# Patient Record
Sex: Female | Born: 1950
Health system: Southern US, Community
[De-identification: ages and names within clinical notes are randomized; demographics above are authoritative.]

## PROBLEM LIST (undated history)

## (undated) ENCOUNTER — Ambulatory Visit (HOSPITAL_COMMUNITY): Payer: Self-pay

## (undated) DIAGNOSIS — I10 Essential (primary) hypertension: Secondary | ICD-10-CM

## (undated) DIAGNOSIS — M1990 Unspecified osteoarthritis, unspecified site: Secondary | ICD-10-CM

## (undated) DIAGNOSIS — J45909 Unspecified asthma, uncomplicated: Secondary | ICD-10-CM

## (undated) DIAGNOSIS — R011 Cardiac murmur, unspecified: Secondary | ICD-10-CM

## (undated) HISTORY — PX: OTHER SURGICAL HISTORY: SHX169

## (undated) HISTORY — DX: Unspecified asthma, uncomplicated: J45.909

## (undated) HISTORY — PX: BLADDER SURGERY: SHX569

## (undated) HISTORY — PX: APPENDECTOMY: SHX54

## (undated) HISTORY — DX: Cardiac murmur, unspecified: R01.1

## (undated) HISTORY — PX: CHOLECYSTECTOMY: SHX55

---

## 2019-06-13 ENCOUNTER — Ambulatory Visit (HOSPITAL_COMMUNITY)
Admission: EM | Admit: 2019-06-13 | Discharge: 2019-06-13 | Disposition: A | Discharge status: Home / Self Care | Payer: Self-pay | Attending: Urgent Care | Admitting: Urgent Care

## 2019-06-13 ENCOUNTER — Other Ambulatory Visit: Payer: Self-pay

## 2019-06-13 ENCOUNTER — Encounter (HOSPITAL_COMMUNITY): Payer: Self-pay

## 2019-06-13 DIAGNOSIS — I10 Essential (primary) hypertension: Secondary | ICD-10-CM

## 2019-06-13 DIAGNOSIS — R011 Cardiac murmur, unspecified: Secondary | ICD-10-CM

## 2019-06-13 DIAGNOSIS — Z760 Encounter for issue of repeat prescription: Secondary | ICD-10-CM

## 2019-06-13 DIAGNOSIS — R030 Elevated blood-pressure reading, without diagnosis of hypertension: Secondary | ICD-10-CM

## 2019-06-13 HISTORY — DX: Unspecified osteoarthritis, unspecified site: M19.90

## 2019-06-13 HISTORY — DX: Essential (primary) hypertension: I10

## 2019-06-13 MED ORDER — LOSARTAN POTASSIUM 50 MG PO TABS: 50.0000 mg | ORAL_TABLET | Freq: Two times a day (BID) | ORAL | 0 refills | Status: DC

## 2019-06-13 NOTE — ED Triage Notes (Signed)
Pt states she needs B/P med refilled . Pt needs Losartan 50 mg.

## 2019-06-13 NOTE — ED Provider Notes (Signed)
[facility]   MRN: [id number] DOB: 1951/02/28  Subjective:   Claudia Hernandez is a 69 y.o. female presenting for medication refill of her losartan.  Patient recently moved here and will plan on leaving her long-term, has not established with a new PCP.  She is taking 50 mg twice daily.  Denies any other chronic conditions apart from hypertension and arthritis.  Denies history of heart disease, stroke.    No Known Allergies  Past Medical History:  Diagnosis Date  . Arthritis   . Hypertension      Past Surgical History:  Procedure Laterality Date  . APPENDECTOMY       Social History   Tobacco Use  . Smoking status: Never Smoker  Substance Use Topics  . Alcohol use: Never  . Drug use: Not on file    Review of Systems  Constitutional: Negative for fever and malaise/fatigue.  HENT: Negative for congestion, ear pain, sinus pain and sore throat.   Eyes: Negative for blurred vision, double vision, discharge and redness.  Respiratory: Negative for cough, hemoptysis, shortness of breath and wheezing.   Cardiovascular: Negative for chest pain.  Gastrointestinal: Negative for abdominal pain, diarrhea, nausea and vomiting.  Genitourinary: Negative for dysuria, flank pain and hematuria.  Musculoskeletal: Negative for myalgias.  Skin: Negative for rash.  Neurological: Negative for dizziness, tingling, sensory change, speech change, focal weakness, weakness and headaches.  Psychiatric/Behavioral: Negative for depression and substance abuse.     Objective:   Vitals: BP (!) 148/82 (BP Location: Right Arm)   Temp 98.4 F (36.9 C) (Oral)   Resp 18   Wt 132 lb 4.4 oz (60 kg)   SpO2 98%   Physical Exam Constitutional:      General: She is not in acute distress.    Appearance: Normal appearance. She is well-developed. She is not ill-appearing, toxic-appearing or diaphoretic.  HENT:     Head: Normocephalic and atraumatic.     Nose: Nose normal.   Mouth/Throat:     Mouth: Mucous membranes are moist.  Eyes:     General: No scleral icterus.    Extraocular Movements: Extraocular movements intact.     Pupils: Pupils are equal, round, and reactive to light.  Cardiovascular:     Rate and Rhythm: Normal rate and regular rhythm.     Pulses: Normal pulses.     Heart sounds: Murmur (Soft systolic ejection murmur best heard over left upper sternal border) present. No friction rub. No gallop.   Pulmonary:     Effort: Pulmonary effort is normal. No respiratory distress.     Breath sounds: Normal breath sounds. No stridor. No wheezing, rhonchi or rales.  Musculoskeletal:     Right lower leg: No edema.     Left lower leg: No edema.  Skin:    General: Skin is warm and dry.     Findings: No rash.  Neurological:     Mental Status: She is alert and oriented to person, place, and time.     Cranial Nerves: No cranial nerve deficit.     Motor: No weakness.     Coordination: Coordination normal.     Gait: Gait normal.     Deep Tendon Reflexes: Reflexes normal.  Psychiatric:        Mood and Affect: Mood normal.        Behavior: Behavior normal.        Thought Content: Thought content normal.        Judgment: Judgment  normal.      Assessment and Plan :   1. Medication refill   2. Essential hypertension   3. Elevated blood pressure reading   4. Heart murmur     Refilled losartan, counseled on standards of care for essential hypertension and overall general health maintenance.  Recommended multiple options for her to establish care.  Provided with 90-day refill for her losartan. Counseled patient on potential for adverse effects with medications prescribed/recommended today, ER and return-to-clinic precautions discussed, patient verbalized understanding.    Jaynee Eagles, [place] 06/14/19 [phone]

## 2019-06-30 ENCOUNTER — Other Ambulatory Visit: Payer: Self-pay

## 2019-06-30 ENCOUNTER — Emergency Department (HOSPITAL_COMMUNITY): Payer: Self-pay

## 2019-06-30 ENCOUNTER — Emergency Department (HOSPITAL_COMMUNITY)
Admission: EM | Admit: 2019-06-30 | Discharge: 2019-06-30 | Disposition: A | Discharge status: Home / Self Care | Payer: Self-pay | Attending: Emergency Medicine | Admitting: Emergency Medicine

## 2019-06-30 ENCOUNTER — Encounter (HOSPITAL_COMMUNITY): Payer: Self-pay

## 2019-06-30 DIAGNOSIS — R0602 Shortness of breath: Secondary | ICD-10-CM | POA: Insufficient documentation

## 2019-06-30 DIAGNOSIS — R002 Palpitations: Secondary | ICD-10-CM | POA: Insufficient documentation

## 2019-06-30 DIAGNOSIS — M79641 Pain in right hand: Secondary | ICD-10-CM | POA: Insufficient documentation

## 2019-06-30 DIAGNOSIS — R110 Nausea: Secondary | ICD-10-CM | POA: Insufficient documentation

## 2019-06-30 DIAGNOSIS — I10 Essential (primary) hypertension: Secondary | ICD-10-CM | POA: Insufficient documentation

## 2019-06-30 DIAGNOSIS — R42 Dizziness and giddiness: Secondary | ICD-10-CM | POA: Insufficient documentation

## 2019-06-30 LAB — BASIC METABOLIC PANEL
Anion gap: 9 (ref 5–15)
BUN: 14 mg/dL (ref 8–23)
CO2: 25 mmol/L (ref 22–32)
Calcium: 9.3 mg/dL (ref 8.9–10.3)
Chloride: 106 mmol/L (ref 98–111)
Creatinine, Ser: 0.68 mg/dL (ref 0.44–1.00)
GFR calc Af Amer: >60 mL/min (ref >60)
GFR calc non Af Amer: >60 mL/min (ref >60)
Glucose, Bld: 100 mg/dL — ABNORMAL HIGH (ref 70–99)
Potassium: 3.7 mmol/L (ref 3.5–5.1)
Sodium: 140 mmol/L (ref 135–145)

## 2019-06-30 LAB — CBC
HCT: 44.8 % (ref 36.0–46.0)
Hemoglobin: 15.2 g/dL — ABNORMAL HIGH (ref 12.0–15.0)
MCH: 34.8 pg — ABNORMAL HIGH (ref 26.0–34.0)
MCHC: 33.9 g/dL (ref 30.0–36.0)
MCV: 102.5 fL — ABNORMAL HIGH (ref 80.0–100.0)
Platelets: 257 10*3/uL (ref 150–400)
RBC: 4.37 MIL/uL (ref 3.87–5.11)
RDW: 12.7 % (ref 11.5–15.5)
WBC: 8.5 10*3/uL (ref 4.0–10.5)
nRBC: 0 % (ref 0.0–0.2)

## 2019-06-30 LAB — TROPONIN I (HIGH SENSITIVITY)
Troponin I (High Sensitivity): 2 ng/L (ref <18)
Troponin I (High Sensitivity): <2 ng/L (ref <18)

## 2019-06-30 MED ORDER — ONDANSETRON HCL 4 MG/2ML IJ SOLN
4.0000 mg | Freq: Once | INTRAMUSCULAR | Status: AC
  Administered 2019-06-30: 4 mg via INTRAVENOUS
  Filled 2019-06-30: qty 2

## 2019-06-30 MED ORDER — SODIUM CHLORIDE 0.9% FLUSH: 3.0000 mL | Freq: Once | INTRAVENOUS | Status: DC

## 2019-06-30 NOTE — ED Notes (Signed)
Patient verbalizes understanding of discharge instructions. Opportunity for questioning and answers were provided. Armband removed by staff, pt discharged from ED   Interpreter used for d/c

## 2019-06-30 NOTE — ED Provider Notes (Signed)
[facility] EMERGENCY DEPARTMENT Provider Note   CSN: [id number] Arrival date & time: 06/30/19  5427     History Chief Complaint  Patient presents with  . Palpitations    Claudia Hernandez is a 69 y.o. female w PMHx HTN, presenting to the ED with complaint of palpitations. She reports palpitations have been intermittent for about 3 months now, mostly occurring at rest. She described the palpitations as "tachycardia" or her "heart racing." She states last night she had another episode however this time had assoc dizziness, nausea, SOB and right hand pain. She reports a very small amount of left sided chest pain that she is unable to describe.  Currently she still feels a little dizzy right now, though describes it as feeling like she wants to vomit when asked if she felt more lightheadedness or room-spinning dizziness. She recently moved to the area and has new PCP appt established for February. No known heart problems.     The history is provided by the patient.       Past Medical History:  Diagnosis Date  . Arthritis   . Hypertension     There are no problems to display for this patient.   Past Surgical History:  Procedure Laterality Date  . APPENDECTOMY       OB History   No obstetric history on file.     No family history on file.  Social History   Tobacco Use  . Smoking status: Never Smoker  . Smokeless tobacco: Never Used  Substance Use Topics  . Alcohol use: Never  . Drug use: Never    Home Medications Prior to Admission medications   Medication Sig Start Date End Date Taking? Authorizing Provider  losartan (COZAAR) 50 MG tablet Take 1 tablet (50 mg total) by mouth 2 (two) times daily. 06/13/19   Wallis Bamberg, PA-C    Allergies    Patient has no known allergies.  Review of Systems   Review of Systems  All other systems reviewed and are negative.   Physical Exam Updated Vital Signs BP 120/77   Pulse 66   Temp 97.6  F (36.4 C) (Oral)   Resp 14   SpO2 97%   Physical Exam Vitals and nursing note reviewed.  Constitutional:      Appearance: She is well-developed.  HENT:     Head: Normocephalic and atraumatic.  Eyes:     Conjunctiva/sclera: Conjunctivae normal.  Cardiovascular:     Rate and Rhythm: Normal rate and regular rhythm.  Pulmonary:     Effort: Pulmonary effort is normal. No respiratory distress.     Breath sounds: Normal breath sounds.  Chest:     Chest wall: No tenderness.  Abdominal:     General: Bowel sounds are normal.     Palpations: Abdomen is soft.     Tenderness: There is no abdominal tenderness.  Musculoskeletal:     Cervical back: Normal range of motion.     Right lower leg: No edema.     Left lower leg: No edema.  Skin:    General: Skin is warm.  Neurological:     Mental Status: She is alert.  Psychiatric:        Behavior: Behavior normal.     ED Results / Procedures / Treatments   Labs (all labs ordered are listed, but only abnormal results are displayed) Labs Reviewed  BASIC METABOLIC PANEL - Abnormal; Notable for the following components:  Result Value   Glucose, Bld 100 (*)    All other components within normal limits  CBC - Abnormal; Notable for the following components:   Hemoglobin 15.2 (*)    MCV 102.5 (*)    MCH 34.8 (*)    All other components within normal limits  TROPONIN I (HIGH SENSITIVITY)  TROPONIN I (HIGH SENSITIVITY)    EKG EKG Interpretation  Date/Time:  Saturday June 30 2019 06:59:56 EST Ventricular Rate:  83 PR Interval:    QRS Duration: 95 QT Interval:  395 QTC Calculation: 465 R Axis:   33 Text Interpretation: Sinus rhythm Nonspecific T abnrm, anterolateral leads Artifact Abnormal ECG Confirmed by Carmin Muskrat [phone]) on 06/30/2019 8:22:06 AM Also confirmed by Carmin Muskrat ([id number]), editor Hattie Perch [phone])  on 06/30/2019 10:00:09 AM   Radiology DG Chest 2 View  Result Date: 06/30/2019 CLINICAL DATA:   Chest pain, palpitations EXAM: CHEST - 2 VIEW COMPARISON:  None. FINDINGS: Lungs are clear.  No pleural effusion or pneumothorax. The heart is normal in size. Visualized osseous structures are within normal limits. IMPRESSION: Normal chest radiographs. Electronically Signed   By: Julian Hy M.D.   On: 06/30/2019 07:01    Procedures Procedures (including critical care time)  Medications Ordered in ED Medications  sodium chloride flush (NS) 0.9 % injection 3 mL (3 mLs Intravenous Not Given 06/30/19 5809)  ondansetron (ZOFRAN) injection 4 mg (4 mg Intravenous Given 06/30/19 9833)    ED Course  I have reviewed the triage vital signs and the nursing notes.  Pertinent labs & imaging results that were available during my care of the patient were reviewed by me and considered in my medical decision making (see chart for details).    MDM Rules/Calculators/A&P                      Patient with history of hypertension, presenting to the emergency department with intermittent palpitations over the last multiple months.  Last night had recurrence of symptoms though had some associated dizziness, nausea, shortness of breath with this.  She also reports a very mild left-sided chest pain that she is unable to describe and that was brief in duration.  On evaluation she is currently without any pain though is having some nausea.  Heart and lung exam is normal.  EKG without arrhythmia.  Chest x-ray is negative.  Labs without electrolyte derangements or anemia.  Troponin x2 is negative.  No arrhythmias on cardiac monitor in the ED.  Low suspicion for ACS.  She has PCP appointment established for evaluation of this.  Encouraged she discuss these chronic palpitations, may need Holter monitor for monitoring to capture palpitation.  Patient is in no distress and agreeable to plan, safe for discharge.  Patient discussed with and evaluated by Dr. Vanita Panda, who agrees with work-up and care plan for discharge.   Discussed results, findings, treatment and follow up. Patient advised of return precautions. Patient verbalized understanding and agreed with plan.   Final Clinical Impression(s) / ED Diagnoses Final diagnoses:  Palpitations    Rx / DC Orders ED Discharge Orders    None       , [place] N, PA-C 06/30/19 1223    Carmin Muskrat, MD 07/01/19 [phone]

## 2019-06-30 NOTE — ED Triage Notes (Signed)
Pt is spanish speaking only, pt states that she has CP and palpations that have been going on for a few days, tonight it woke her up from her sleep, along with SOB, n/v, dizziness, radiation to R arm

## 2019-06-30 NOTE — Discharge Instructions (Addendum)
Please follow closely with primary care. Return to the ER if you have recurrence of symptoms with associated chest pain, lightheadedness, shortness of breath.   Siga de cerca con atencin primaria. Regrese a la sala de emergencias si tiene sntomas recurrentes con dolor en el pecho, aturdimiento o dificultad para respirar asociados.

## 2019-07-20 ENCOUNTER — Ambulatory Visit: Payer: Self-pay | Attending: Family Medicine | Admitting: Family Medicine

## 2019-07-20 ENCOUNTER — Other Ambulatory Visit: Payer: Self-pay

## 2019-07-20 ENCOUNTER — Encounter: Payer: Self-pay | Admitting: Family Medicine

## 2019-07-20 VITALS — BP 133/78 | HR 79 | Temp 97.1°F | Ht 59.5 in | Wt 139.0 lb

## 2019-07-20 DIAGNOSIS — I10 Essential (primary) hypertension: Secondary | ICD-10-CM

## 2019-07-20 DIAGNOSIS — Z09 Encounter for follow-up examination after completed treatment for conditions other than malignant neoplasm: Secondary | ICD-10-CM

## 2019-07-20 DIAGNOSIS — R002 Palpitations: Secondary | ICD-10-CM

## 2019-07-20 MED ORDER — LOSARTAN POTASSIUM 50 MG PO TABS: 50.0000 mg | ORAL_TABLET | Freq: Two times a day (BID) | ORAL | 0 refills | Status: DC

## 2019-07-20 NOTE — Progress Notes (Signed)
New Patient Office Visit  Subjective:  Patient ID: Claudia Hernandez, female    DOB: Apr 29, 1951  Age: 69 y.o. MRN: [id number]  CC:  Chief Complaint  Patient presents with  . New Patient (Initial Visit)    Pt. is here to establish care for hypertension.    HPI Claudia Hernandez [place], 69 year old female new to the practice, who presents to establish care.  She reports that she has recently moved to this area in the past few months.  She is status post emergency department visit on 06/30/2019 due to recurrent palpitations but also on the day of ED visit she also developed some mild left-sided chest pain along with mild nausea.  She continues to have sensation of palpitations which she describes as occasionally feeling as if her heart is racing/fast heart rate which can occur with or without activity.  Sensation most often occurs after she is lying down at night to try and sleep.  She has had no additional chest pain, nausea and denies any episodes of radiation of discomfort/pressure to the neck, jaw, back or arms.  She has had no episodes of sudden onset of sweating/diaphoresis.  She does have high blood pressure for which she is on losartan 50 mg twice daily and this has controlled her blood pressure.  She denies headaches or dizziness related to her blood pressure.  She reports that taking losartan 100 mg once daily did not adequately control her blood pressure for 24 hours.  Past Medical History:  Diagnosis Date  . Arthritis   . Hypertension     Past Surgical History:  Procedure Laterality Date  . APPENDECTOMY    . BLADDER SURGERY    . gallbladder removed    . left foot surgery      Family History  Problem Relation Age of Onset  . Drug abuse Neg Hx   . Hypertension Neg Hx     Social History   Socioeconomic History  . Marital status: Single    Spouse name: Not on file  . Number of children: Not on file  . Years of education: Not on file  . Highest education  level: Not on file  Occupational History  . Not on file  Tobacco Use  . Smoking status: Never Smoker  . Smokeless tobacco: Never Used  Substance and Sexual Activity  . Alcohol use: Never  . Drug use: Never  . Sexual activity: Not Currently  Other Topics Concern  . Not on file  Social History Narrative  . Not on file   Social Determinants of Health   Financial Resource Strain:   . Difficulty of Paying Living Expenses: Not on file  Food Insecurity:   . Worried About Programme researcher, broadcasting/film/video in the Last Year: Not on file  . Ran Out of Food in the Last Year: Not on file  Transportation Needs:   . Lack of Transportation (Medical): Not on file  . Lack of Transportation (Non-Medical): Not on file  Physical Activity:   . Days of Exercise per Week: Not on file  . Minutes of Exercise per Session: Not on file  Stress:   . Feeling of Stress : Not on file  Social Connections:   . Frequency of Communication with Friends and Family: Not on file  . Frequency of Social Gatherings with Friends and Family: Not on file  . Attends Religious Services: Not on file  . Active Member of Clubs or Organizations: Not on file  .  Attends Banker Meetings: Not on file  . Marital Status: Not on file  Intimate Partner Violence:   . Fear of Current or Ex-Partner: Not on file  . Emotionally Abused: Not on file  . Physically Abused: Not on file  . Sexually Abused: Not on file    ROS Review of Systems  Constitutional: Positive for fatigue. Negative for chills and fever.  HENT: Negative for sore throat and trouble swallowing.   Eyes: Negative for photophobia and visual disturbance.  Respiratory: Negative for cough and shortness of breath.   Cardiovascular: Positive for palpitations. Negative for chest pain and leg swelling.  Gastrointestinal: Negative for abdominal pain, blood in stool, constipation, diarrhea and nausea.  Endocrine: Negative for cold intolerance, heat intolerance, polydipsia,  polyphagia and polyuria.  Genitourinary: Negative for dysuria and frequency.  Musculoskeletal: Positive for arthralgias. Negative for back pain and gait problem.  Skin: Negative for rash and wound.  Neurological: Negative for dizziness and headaches.  Hematological: Negative for adenopathy. Does not bruise/bleed easily.  Psychiatric/Behavioral: Negative for self-injury and suicidal ideas. The patient is nervous/anxious (Only with onset of palpitations).     Objective:   Today's Vitals: BP 133/78 (BP Location: Left Arm, Patient Position: Sitting, Cuff Size: Normal)   Pulse 79   Temp (!) 97.1 F (36.2 C) (Temporal)   Ht 4' 11.5" (1.511 m)   Wt 139 lb (63 kg)   SpO2 99%   BMI 27.60 kg/m   Physical Exam Vitals and nursing note reviewed.  Constitutional:      General: She is not in acute distress.    Appearance: Normal appearance.  Neck:     Vascular: No carotid bruit.  Cardiovascular:     Rate and Rhythm: Normal rate and regular rhythm.  Pulmonary:     Effort: Pulmonary effort is normal.     Breath sounds: Normal breath sounds.  Abdominal:     Palpations: Abdomen is soft.     Tenderness: There is no abdominal tenderness. There is no right CVA tenderness, left CVA tenderness, guarding or rebound.  Musculoskeletal:        General: No swelling or tenderness.     Cervical back: Normal range of motion and neck supple. No tenderness.     Right lower leg: No edema.     Left lower leg: No edema.  Lymphadenopathy:     Cervical: No cervical adenopathy.  Skin:    General: Skin is warm and dry.  Neurological:     General: No focal deficit present.     Mental Status: She is alert and oriented to person, place, and time.  Psychiatric:        Mood and Affect: Mood normal.        Behavior: Behavior normal.     Assessment & Plan:  1. Essential hypertension Blood pressure at recent ED visit was 120/77 and continues to be controlled at today's visit on her current losartan 50 mg  twice daily as she reports that the medication does not work as well when dosed 100 mg daily.  New refill provided of losartan 50 mg twice daily and patient will continue to monitor her blood pressure, low-sodium diet and exercise as tolerated.  She has been referred to cardiology regarding her current issues with palpitations. - losartan (COZAAR) 50 MG tablet; Take 1 tablet (50 mg total) by mouth 2 (two) times daily.  Dispense: 180 tablet; Refill: 0  2. Palpitations; 3.  Encounter for examination following treatment at hospital  Emergency department visit from 06/30/2019 reviewed and discussed with the patient at today's visit.  She was seen at the emergency department for palpitations/sensation of sudden increase in heart rate which she continues to experience.  EKG from 06/30/2019 showed sinus rhythm with nonspecific T wave abnormality in the anterior lateral leads.  She will have basic metabolic panel to check electrolytes and T4/TSH to check for thyroid abnormality due to her sensation of palpitations/rapid heart rate.  Cardiology referral placed for further evaluation.  No anemia on recent CBC.  She is encouraged to obtain information and schedule follow-up at checkout regarding [facility] discount program to help with medical costs. - T4 AND TSH - Basic Metabolic Panel - Ambulatory referral to Cardiology   Outpatient Encounter Medications as of 07/20/2019  Medication Sig  . ACETAMINOPHEN PO Take by mouth.  . COLLAGEN PO Take by mouth.  . losartan (COZAAR) 50 MG tablet Take 1 tablet (50 mg total) by mouth 2 (two) times daily.  . [DISCONTINUED] losartan (COZAAR) 50 MG tablet Take 1 tablet (50 mg total) by mouth 2 (two) times daily.   No facility-administered encounter medications on file as of 07/20/2019.    An After Visit Summary was printed and given to the patient.   Follow-up: Return in about 4 months (around 11/17/2019) for HTN; needs info on [facility] discount program.   Antony Blackbird, MD

## 2019-07-20 NOTE — Patient Instructions (Signed)
Palpitaciones Palpitations Las palpitaciones son sensaciones de que su latido cardaco no es normal. El latido cardaco puede sentirse de las siguientes maneras:  Desigual.  Ms rpido de lo normal.  Como un aleteo.  Que saltea un latido. Generalmente no es un problema grave. En algunos casos, podra necesitar estudios para descartar algn problema grave. Siga estas indicaciones en su casa: Est atento a cualquier cambio en su afeccin. Tome estas medidas para ayudar a Chief Technology Officer sus sntomas: Qu debe comer y beber  Evite lo siguiente: ? Caf, t, gaseosas y bebidas energticas. ? Chocolate. ? Alcohol. ? Pastillas para adelgazar. Estilo de vida   Intente reducir [employer] de estrs. Estas cosas pueden ayudarlo a relajarse: ? Yoga. ? Respiracin profunda y meditacin. ? Actividad fsica. ? Usar palabras e imgenes para crear pensamientos positivos (visualizacin guiada). ? Usar la mente para controlar cosas en el cuerpo (biorretroalimentacin).  No consuma drogas.  Descanse y duerma lo suficiente. Mantenga un horario habitual para acostarse. Instrucciones generales   [employer] de venta libre y los recetados solamente como se lo haya indicado el mdico.  No consuma ningn producto que contenga nicotina o tabaco, como cigarrillos y Psychologist, sport and exercise. Si necesita ayuda para dejar de fumar, consulte al mdico.  Concurra a todas las visitas de seguimiento como se lo haya indicado el mdico. Esto es importante. Es probable que necesite ms estudios si las palpitaciones no desaparecen o empeoran. Comunquese con un mdico si:  Los sntomas duran ms de 24 horas.  Los sntomas ocurren con ms frecuencia. Solicite ayuda inmediatamente si:  Electronics engineer.  Le falta el aire.  Tiene un dolor de cabeza muy intenso.  Se sienta mareado.  Pierde el conocimiento (se desmaya). Resumen  Las palpitaciones son sensaciones de que su latido cardaco es  irregular o ms rpido de lo normal. Se siente como un aleteo o que falta un latido.  Evite los alimentos y bebidas que pueden provocar palpitaciones. Entre ellos se incluyen la cafena, el chocolate y las bebidas alcohlicas.  Intente reducir [employer] de estrs. No fume ni consuma drogas.  Obtenga ayuda de inmediato si se desmaya o tiene dolor de [place], falta de [place], dolor de cabeza intenso o [employer]. Esta informacin no tiene Marine scientist el consejo del mdico. Asegrese de hacerle al mdico cualquier pregunta que tenga. Document Revised: 09/05/2017 Document Reviewed: 09/05/2017 Elsevier Patient Education  [place].  Hipertensin en los adultos Hypertension, Adult El trmino hipertensin es otra forma de denominar a la presin arterial elevada. La presin arterial elevada fuerza al corazn a trabajar ms para bombear la sangre. Esto puede causar problemas con el paso del [place]. Una lectura de presin arterial est compuesta por 2 nmeros. Hay un nmero superior (sistlico) sobre un nmero inferior (diastlico). Lo ideal es tener la presin arterial por debajo de 120/80. Las elecciones saludables pueden ayudar a Engineer, materials presin arterial, o tal vez necesite medicamentos para bajarla. Cules son las causas? Se desconoce la causa de esta afeccin. Algunas afecciones pueden estar relacionadas con la presin arterial alta. Qu incrementa el riesgo?  Fumar.  Tener diabetes mellitus tipo 2, colesterol alto, o ambos.  No hacer la cantidad suficiente de actividad fsica o ejercicio.  Tener sobrepeso.  Consumir mucha grasa, azcar, caloras o sal (sodio) en su dieta.  Beber alcohol en exceso.  Tener una enfermedad renal a largo plazo (crnica).  Tener antecedentes familiares de presin arterial alta.  Edad. Los [employer] Financial con  la edad.  [place]aza. El riesgo es mayor para las Statistician.  Sexo. Antes de los 45aos, los hombres corren ms [employer]. Despus de los 65aos, las mujeres corren ms [employer].  Tener apnea obstructiva del sueo.  Estrs. Cules son los signos o los sntomas?  Es posible que la presin arterial alta puede no cause sntomas. La presin arterial muy alta (crisis hipertensiva) puede provocar: ? Dolor de [place]. ? Sensaciones de preocupacin o nerviosismo (ansiedad). ? Falta de aire. ? Hemorragia nasal. ? Sensacin de Journalist, newspaper (nuseas). ? Vmitos. ? Cambios en la forma de ver. ? Dolor muy intenso en el pecho. ? Convulsiones. Cmo se trata?  Esta afeccin se trata haciendo cambios saludables en el estilo de vida, por ejemplo: ? Consumir alimentos saludables. ? Hacer ms ejercicio. ? Beber menos alcohol.  El mdico puede recetarle medicamentos si los cambios en el estilo de vida no son suficientes para Museum/gallery curator la presin arterial y si: ? El nmero de arriba est por encima de 130. ? El nmero de abajo est por encima de 80.  Su presin arterial personal ideal puede variar. Siga estas instrucciones en su casa: Comida y bebida   Si se lo dicen, siga el plan de alimentacin de DASH (Dietary Approaches to Stop Hypertension, Maneras de alimentarse para detener la hipertensin). Para seguir este plan: ? Llene la mitad del plato de cada comida con frutas y verduras. ? Llene un cuarto del plato de cada comida con cereales integrales. Los cereales integrales incluyen pasta integral, arroz integral y pan integral. ? Coma y beba productos lcteos con bajo contenido de grasa, como leche descremada o yogur bajo en grasas. ? Llene un cuarto del plato de cada comida con protenas bajas en grasa (magras). Las protenas bajas en grasa incluyen pescado, pollo sin piel, huevos, frijoles y tofu. ? Evite consumir carne grasa, carne curada y procesada, o pollo con piel. ? Evite consumir alimentos prehechos o procesados.  Consuma menos de 1500 mg de sal por da.  No beba  alcohol si: ? El mdico le indica que no lo haga. ? Est embarazada, puede estar embarazada o est tratando de quedar embarazada.  Si bebe alcohol: ? Limite la cantidad que bebe a lo siguiente:  De 0 a 1 medida por da para las mujeres.  De 0 a 2 medidas por da para los hombres. ? Est atento a la cantidad de alcohol que hay en las bebidas que toma. En los [place], una medida equivale a una botella de cerveza de 12oz ( ), un vaso de vino de 5oz ( ) o un vaso de una bebida alcohlica de alta graduacin de 1oz (56ml). Estilo de vida   Trabaje con su mdico para mantenerse en un peso saludable o para perder peso. Pregntele a su mdico cul es el peso recomendable para usted.  Haga al menos de ejercicio la [employer]V de la [place]. Estos pueden incluir caminar, nadar o andar en bicicleta.  Realice al menos 30 minutos de ejercicio que fortalezca sus msculos (ejercicios de resistencia) al menos 3 das a la [place]. Estos pueden incluir levantar pesas o hacer [place].  No consuma ningn producto que contenga nicotina o tabaco, como cigarrillos, cigarrillos electrnicos y tabaco de Theatre manager. Si necesita ayuda para dejar de fumar, consulte al [employer].  Controle su presin arterial en su casa tal como le indic el mdico.  Concurra a todas las visitas de seguimiento  como se lo haya indicado el mdico. Esto es importante. Medicamentos  [employer] de venta libre y los recetados solamente como se lo haya indicado el mdico. Siga cuidadosamente las indicaciones.  No omita las dosis de medicamentos para la presin arterial. Los medicamentos pierden eficacia si omite dosis. El hecho de omitir las dosis tambin [place] el riesgo de otros problemas.  Pregntele a su mdico a qu efectos secundarios o reacciones a los Museum/gallery curator. Comunquese con un mdico si:  Piensa que tiene [place] reaccin a los medicamentos que est  tomando.  Tiene dolores de cabeza frecuentes (recurrentes).  Se siente mareado.  Tiene hinchazn en los tobillos.  Tiene problemas de visin. Solicite ayuda inmediatamente si:  Siente un dolor de cabeza muy intenso.  Empieza a sentirse desorientado (confundido).  Se siente dbil o adormecido.  Siente que va a desmayarse.  Tiene un dolor muy intenso en las siguientes zonas: ? Pecho. ? Vientre (abdomen).  Vomita ms de una vez.  Tiene dificultad para respirar. Resumen  El trmino hipertensin es otra forma de denominar a la presin arterial elevada.  La presin arterial elevada fuerza al corazn a trabajar ms para bombear la sangre.  Para la [employer], una presin arterial normal es menor que 120/80.  Las decisiones saludables pueden ayudarle a disminuir su presin arterial. Si no puede bajar su presin arterial mediante decisiones saludables, es posible que deba tomar medicamentos. Esta informacin no tiene Theme park manager el consejo del mdico. Asegrese de hacerle al mdico cualquier pregunta que tenga. Document Revised: 03/09/2018 Document Reviewed: 03/09/2018 Elsevier Patient Education  2020 [employer].

## 2019-07-21 LAB — BASIC METABOLIC PANEL WITH GFR
BUN/Creatinine Ratio: 29 — ABNORMAL HIGH (ref 12–28)
BUN: 16 mg/dL (ref 8–27)
CO2: 25 mmol/L (ref 20–29)
Calcium: 9.4 mg/dL (ref 8.7–10.3)
Chloride: 104 mmol/L (ref 96–106)
Creatinine, Ser: 0.55 mg/dL — ABNORMAL LOW (ref 0.57–1.00)
GFR calc Af Amer: 112 mL/min/1.73
GFR calc non Af Amer: 97 mL/min/1.73
Glucose: 87 mg/dL (ref 65–99)
Potassium: 4.3 mmol/L (ref 3.5–5.2)
Sodium: 143 mmol/L (ref 134–144)

## 2019-07-21 LAB — T4 AND TSH
T4, Total: 7.7 ug/dL (ref 4.5–12.0)
TSH: 1.87 u[IU]/mL (ref 0.450–4.500)

## 2019-07-31 ENCOUNTER — Ambulatory Visit: Payer: Self-pay | Admitting: Cardiovascular Disease

## 2019-08-13 ENCOUNTER — Ambulatory Visit: Payer: Self-pay | Admitting: Cardiology

## 2019-08-15 ENCOUNTER — Ambulatory Visit: Payer: Self-pay | Admitting: Cardiology

## 2019-08-20 ENCOUNTER — Encounter: Payer: Self-pay | Admitting: Family Medicine

## 2019-08-31 ENCOUNTER — Ambulatory Visit: Payer: Self-pay

## 2019-08-31 ENCOUNTER — Other Ambulatory Visit: Payer: Self-pay

## 2019-11-21 ENCOUNTER — Ambulatory Visit: Payer: Self-pay | Admitting: Family Medicine

## 2019-12-06 ENCOUNTER — Ambulatory Visit: Payer: Self-pay | Admitting: Internal Medicine

## 2020-02-05 ENCOUNTER — Ambulatory Visit: Payer: Self-pay | Attending: Internal Medicine | Admitting: Internal Medicine

## 2020-02-12 ENCOUNTER — Ambulatory Visit: Payer: Self-pay

## 2020-02-12 ENCOUNTER — Other Ambulatory Visit: Payer: Self-pay

## 2020-03-04 ENCOUNTER — Other Ambulatory Visit: Payer: Self-pay

## 2020-03-04 ENCOUNTER — Ambulatory Visit: Payer: Self-pay | Attending: Family Medicine

## 2020-03-05 NOTE — Progress Notes (Deleted)
Patient ID: Claudia Hernandez, female    DOB: [birth year]  MRN: [id number]  CC: Hypertension Follow-Up  Subjective: Claudia Hernandez is a 69 y.o. female who presents for hypertension follow-up.  1. HYPERTENSION FOLLOW-UP: 07/20/2019: Visit with Dr. Jillyn Hidden. During that encounter blood pressure controlled. Losartan continued. Referred to Cardiology for palpitations.   03/06/2020: Currently taking: see medication list Have you taken your blood pressure medication today: []  Yes []  No  Med Adherence: []  Yes    []  No Medication side effects: []  Yes    []  No Adherence with salt restriction: []  Yes    []  No Exercise: Yes []  No []  Home Monitoring?: []  Yes    []  No Monitoring Frequency: []  Yes    []  No Home BP results range: []  Yes    []  No Smoking []  Yes []  No SOB? []  Yes    []  No Chest Pain?: []  Yes    []  No Leg swelling?: []  Yes    []  No Headaches?: []  Yes    []  No Dizziness? []  Yes    []  No Comments:     Current Outpatient Medications on File Prior to Visit  Medication Sig Dispense Refill  . ACETAMINOPHEN PO Take by mouth.    . COLLAGEN PO Take by mouth.    . losartan (COZAAR) 50 MG tablet Take 1 tablet (50 mg total) by mouth 2 (two) times daily. 180 tablet 0   No current facility-administered medications on file prior to visit.    No Known Allergies  Social History   Socioeconomic History  . Marital status: Single    Spouse name: Not on file  . Number of children: Not on file  . Years of education: Not on file  . Highest education level: Not on file  Occupational History  . Not on file  Tobacco Use  . Smoking status: Never Smoker  . Smokeless tobacco: Never Used  Substance and Sexual Activity  . Alcohol use: Never  . Drug use: Never  . Sexual activity: Not Currently  Other Topics Concern  . Not on file  Social History Narrative  . Not on file   Social Determinants of Health   Financial Resource Strain:   . Difficulty of Paying Living Expenses:  Not on file  Food Insecurity:   . Worried About in the Last Year: Not on file  . Ran Out of Food in the Last Year: Not on file  Transportation Needs:   . Lack of Transportation (Medical): Not on file  . Lack of Transportation (Non-Medical): Not on file  Physical Activity:   . Days of Exercise per Week: Not on file  . Minutes of Exercise per Session: Not on file  Stress:   . Feeling of Stress : Not on file  Social Connections:   . Frequency of Communication with Friends and Family: Not on file  . Frequency of Social Gatherings with Friends and Family: Not on file  . Attends Religious Services: Not on file  . Active Member of Clubs or Organizations: Not on file  . Attends Meetings: Not on file  . Marital Status: Not on file  Intimate Partner Violence:   . Fear of Current or Ex-Partner: Not on file  . Emotionally Abused: Not on file  . Physically Abused: Not on file  . Sexually Abused: Not on file    Family History  Problem Relation Age of Onset  . Drug abuse  Neg Hx   . Hypertension Neg Hx     Past Surgical History:  Procedure Laterality Date  . APPENDECTOMY    . BLADDER SURGERY    . gallbladder removed    . left foot surgery      ROS: Review of Systems Negative except as stated above  PHYSICAL EXAM: There were no vitals taken for this visit.  Physical Exam  {female adult master:310786} {female adult master:310785}  CMP Latest Ref Rng & Units 07/20/2019 06/30/2019  Glucose 65 - 99 mg/dL 87 947(S)  BUN 8 - 27 mg/dL 16 14  Creatinine 9.62 - 1.00 mg/dL 8.36(O) 2.94  Sodium 765 - 144 mmol/L 143 140  Potassium 3.5 - 5.2 mmol/L 4.3 3.7  Chloride 96 - 106 mmol/L 104 106  CO2 20 - 29 mmol/L 25 25  Calcium 8.7 - 10.3 mg/dL 9.4 9.3   Lipid Panel  No results found for: CHOL, TRIG, HDL, CHOLHDL, VLDL, LDLCALC, LDLDIRECT  CBC    Component Value Date/Time   WBC 8.5 06/30/2019 0731   RBC 4.37 06/30/2019 0731   HGB 15.2 (H)  06/30/2019 0731   HCT 44.8 06/30/2019 0731   PLT 257 06/30/2019 0731   MCV 102.5 (H) 06/30/2019 0731   MCH 34.8 (H) 06/30/2019 0731   MCHC 33.9 06/30/2019 0731   RDW 12.7 06/30/2019 0731    ASSESSMENT AND PLAN:  There are no diagnoses linked to this encounter.   Patient was given the opportunity to ask questions.  Patient verbalized understanding of the plan and was able to repeat key elements of the plan.   No orders of the defined types were placed in this encounter.    Requested Prescriptions    No prescriptions requested or ordered in this encounter    No follow-ups on file.  Rema Fendt, NP

## 2020-03-06 ENCOUNTER — Ambulatory Visit: Payer: Self-pay | Admitting: Family

## 2020-03-10 ENCOUNTER — Ambulatory Visit: Payer: Self-pay | Attending: Nurse Practitioner | Admitting: Family Medicine

## 2020-03-10 ENCOUNTER — Other Ambulatory Visit: Payer: Self-pay

## 2020-03-10 ENCOUNTER — Encounter: Payer: Self-pay | Admitting: Family Medicine

## 2020-03-10 VITALS — BP 126/75 | HR 85 | Ht 59.0 in | Wt 136.0 lb

## 2020-03-10 DIAGNOSIS — I10 Essential (primary) hypertension: Secondary | ICD-10-CM

## 2020-03-10 DIAGNOSIS — L720 Epidermal cyst: Secondary | ICD-10-CM

## 2020-03-10 DIAGNOSIS — M79674 Pain in right toe(s): Secondary | ICD-10-CM

## 2020-03-10 DIAGNOSIS — H6123 Impacted cerumen, bilateral: Secondary | ICD-10-CM

## 2020-03-10 MED ORDER — LOSARTAN POTASSIUM 50 MG PO TABS: 50.0000 mg | ORAL_TABLET | Freq: Two times a day (BID) | ORAL | 1 refills | Status: DC

## 2020-03-10 NOTE — Patient Instructions (Signed)
Acumulacin de cera en el odo en adultos Earwax Buildup, Adult Los odos producen una sustancia llamada cera que ayuda a evitar que ingresen bacterias en el odo y protege la piel del canal [place]. En ocasiones, la cera se puede acumular en el odo y causar molestias o prdida de la audicin. Qu incrementa el riesgo? Es ms probable que Orthoptist en las personas que:  Son hombres.  Son de [place].  Producen ms cera de forma natural.  Se limpian frecuentemente los odos con hisopos.  Usan tapones para los odos con frecuencia.  Usan auriculares internos con frecuencia.  Usan audfonos.  Tienen los canales [employer].  Tienen cera que es demasiado densa o pegajosa.  Tienen eczema.  Estn deshidratadas.  Tienen vello excesivo en el canal auditivo. Cules son los signos o los sntomas? Los sntomas de esta afeccin [employer] siguientes:  Audicin reducida [place].  Sensacin de que el odo est lleno u obstruido.  Secrecin de lquido.  Dolor de odo.  Picazn en el odo.  Zumbidos en el odo.  Tos.  Porcin de cera que se puede observar en el interior del canal auditivo. Cmo se diagnostica? Esta afeccin se puede diagnosticar en funcin de lo siguiente:  Sus sntomas.  Sus antecedentes mdicos.  Un examen de odo. Durante el examen, el mdico mirar dentro de su odo con un instrumento llamado otoscopio. Pueden hacerle otros estudios, como una prueba de audicin. Cmo se trata? El tratamiento de esta afeccin puede incluir lo siguiente:  Gotas ticas para ablandar la cera.  Extraccin de cera realizada por un mdico. El mdico tambin podr hacer lo siguiente: ? Enjuagar el odo con agua. ? Usar un instrumento con punta en asa (cureta). ? Usar un dispositivo para aspirar.  Ciruga para extraer la acumulacin de cera. Esto puede Editor, commissioning graves. Siga estas indicaciones en su casa:   Tome los  medicamentos de venta libre y los recetados solamente como se lo haya indicado el mdico.  No se introduzca ningn objeto en el odo, ni siquiera hisopos de algodn. La abertura del canal auditivo se puede limpiar con un pao o pauelo facial.  Siga las indicaciones del mdico acerca de cmo limpiarse los odos. No se limpie los odos en exceso.  Beba suficiente lquido como para mantener la orina clara o de color amarillo plido. Esto ayudar a Cabin crew.  Concurra a todas las visitas de control como se lo haya indicado el mdico. Si tiene acumulacin de cera en el odo con frecuencia o [place] audfonos, visite a su mdico para que le realice una limpieza de odo de rutina y preventiva. Pregntele al mdico con qu frecuencia debe programar estas limpiezas.  Si tiene audfonos, lmpielos segn las instrucciones del fabricante y de su mdico. Comunquese con un mdico si:  Tiene dolor de odo.  Presenta fiebre.  Le Magazine features editor, pus u otro lquido del odo.  Tiene prdida de la audicin.  Tiene zumbidos en el odo que no desaparecen.  Los sntomas no mejoran con Dispensing optician.  Tiene la sensacin de que la habitacin da vueltas (vrtigo). Resumen  La cera se puede acumular en el odo y causar molestias o prdida de la audicin.  Los sntomas ms comunes de esta afeccin incluyen una audicin reducida o [place] y la sensacin de que el odo est lleno u obstruido.  Esta afeccin se puede diagnosticar en funcin de los sntomas, sus antecedentes mdicos y un examen de  odo.  Esta afeccin se puede tratar mediante gotas ticas que ablandan la cera o mediante la extraccin de la cera que realiza el mdico.  No se introduzca ningn objeto en el odo, ni siquiera hisopos de algodn. La abertura del canal auditivo se puede limpiar con un pao o pauelo facial. Esta informacin no tiene como fin reemplazar el consejo del mdico. Asegrese de hacerle al mdico cualquier pregunta que  tenga. Document Revised: 09/02/2017 Document Reviewed: 01/17/2017 Elsevier Patient Education  2020 [employer].

## 2020-03-10 NOTE — Progress Notes (Signed)
Established Patient Office Visit  Subjective:  Patient ID: Claudia Hernandez, female    DOB: [birth year]  Age: 69 y.o. MRN: [id number]  CC:  Chief Complaint  Patient presents with  . Medication Refill    add    HPI Claudia Hernandez, 69 yo female, who presents for refill of medication for her blood pressure. She has had no headaches or dizziness in follow-up of her BP. She reports decreased hearing in both ears and in the past, about 2 years ago she had to have wax removed from her ears to help improve her hearing. She also reports that she has had very small cysts in her anterior neck 2 years ago when she lived in [place] and was told that she would need to have imaging done in follow-up in about a year to see if these nodules had gotten any bigger. She also complains of pain in her right second toe for the past 2 months. She felt as if her toe turned black at one point. She recalls no injury to her toe. The toe only hurts when she touches it.   Past Medical History:  Diagnosis Date  . Arthritis   . Hypertension     Past Surgical History:  Procedure Laterality Date  . APPENDECTOMY    . BLADDER SURGERY    . gallbladder removed    . left foot surgery      Family History  Problem Relation Age of Onset  . Drug abuse Neg Hx   . Hypertension Neg Hx     Social History   Socioeconomic History  . Marital status: Single    Spouse name: Not on file  . Number of children: Not on file  . Years of education: Not on file  . Highest education level: Not on file  Occupational History  . Not on file  Tobacco Use  . Smoking status: Never Smoker  . Smokeless tobacco: Never Used  Substance and Sexual Activity  . Alcohol use: Never  . Drug use: Never  . Sexual activity: Not Currently  Other Topics Concern  . Not on file  Social History Narrative  . Not on file   Social Determinants of Health   Financial Resource Strain:   . Difficulty of Paying Living  Expenses: Not on file  Food Insecurity:   . Worried About Programme researcher, broadcasting/film/video in the Last Year: Not on file  . Ran Out of Food in the Last Year: Not on file  Transportation Needs:   . Lack of Transportation (Medical): Not on file  . Lack of Transportation (Non-Medical): Not on file  Physical Activity:   . Days of Exercise per Week: Not on file  . Minutes of Exercise per Session: Not on file  Stress:   . Feeling of Stress : Not on file  Social Connections:   . Frequency of Communication with Friends and Family: Not on file  . Frequency of Social Gatherings with Friends and Family: Not on file  . Attends Religious Services: Not on file  . Active Member of Clubs or Organizations: Not on file  . Attends Banker Meetings: Not on file  . Marital Status: Not on file  Intimate Partner Violence:   . Fear of Current or Ex-Partner: Not on file  . Emotionally Abused: Not on file  . Physically Abused: Not on file  . Sexually Abused: Not on file    Outpatient Medications Prior to Visit  Medication  Sig Dispense Refill  . ACETAMINOPHEN PO Take by mouth.    . COLLAGEN PO Take by mouth.    . losartan (COZAAR) 50 MG tablet Take 1 tablet (50 mg total) by mouth 2 (two) times daily. 180 tablet 0   No facility-administered medications prior to visit.    No Known Allergies  ROS Review of Systems  Constitutional: Negative for chills, fatigue and fever.  HENT: Positive for hearing loss (decreased hearing in each ear). Negative for sore throat and trouble swallowing.   Respiratory: Negative for cough and shortness of breath.   Cardiovascular: Negative for chest pain, palpitations and leg swelling.  Gastrointestinal: Negative for abdominal pain, constipation, diarrhea and nausea.  Endocrine: Negative for polydipsia, polyphagia and polyuria.  Genitourinary: Negative for dysuria and frequency.  Musculoskeletal: Positive for arthralgias (right second toe). Negative for back pain.  Skin:  Negative for rash and wound.  Neurological: Negative for dizziness and headaches.  Hematological: Negative for adenopathy. Does not bruise/bleed easily.      Objective:    Physical Exam HENT:     Ears:     Comments: Build-up of earwax in each canal, right greater than left that is obscuring the TM's Cardiovascular:     Rate and Rhythm: Normal rate and regular rhythm.  Pulmonary:     Effort: Pulmonary effort is normal.     Breath sounds: Normal breath sounds.  Abdominal:     Palpations: Abdomen is soft.     Tenderness: There is no abdominal tenderness. There is no right CVA tenderness, left CVA tenderness, guarding or rebound.  Musculoskeletal:        General: Tenderness present.     Cervical back: Normal range of motion and neck supple. No tenderness.     Right lower leg: No edema.     Left lower leg: No edema.     Comments: Nodularity of DIP joints of the fingers; patient with mild deformity of the second toe-mild hammertoe deformity and mild angulation along the toe and patient with complaint of discomfort with palp along the distal toe and base of the nailbed  Lymphadenopathy:     Cervical: No cervical adenopathy.  Skin:    General: Skin is warm and dry.  Neurological:     General: No focal deficit present.     Mental Status: She is oriented to person, place, and time.  Psychiatric:        Mood and Affect: Mood normal.        Behavior: Behavior normal.     BP 126/75 (BP Location: Left Arm, Patient Position: Sitting)   Pulse 85   Ht 4\' 11"  (1.499 m)   Wt 136 lb (61.7 kg)   SpO2 98%   BMI 27.47 kg/m  Wt Readings from Last 3 Encounters:  03/10/20 136 lb (61.7 kg)  07/20/19 139 lb (63 kg)  06/13/19 132 lb 4.4 oz (60 kg)     Health Maintenance Due  Topic Date Due  . Hepatitis C Screening  Never done  . COVID-19 Vaccine (1) Never done  . TETANUS/TDAP  Never done  . MAMMOGRAM  Never done  . COLONOSCOPY  Never done  . DEXA SCAN  Never done  . PNA vac Low Risk  Adult (1 of 2 - PCV13) Never done  . INFLUENZA VACCINE  Never done      Lab Results  Component Value Date   TSH 1.870 07/20/2019   Lab Results  Component Value Date   WBC 8.5  06/30/2019   HGB 15.2 (H) 06/30/2019   HCT 44.8 06/30/2019   MCV 102.5 (H) 06/30/2019   PLT 257 06/30/2019   Lab Results  Component Value Date   NA 143 07/20/2019   K 4.3 07/20/2019   CO2 25 07/20/2019   GLUCOSE 87 07/20/2019   BUN 16 07/20/2019   CREATININE 0.55 (L) 07/20/2019   CALCIUM 9.4 07/20/2019   ANIONGAP 9 06/30/2019   No results found for: CHOL No results found for: HDL No results found for: LDLCALC No results found for: TRIG No results found for: CHOLHDL No results found for: DVVO1Y    Assessment & Plan:  1. Essential hypertension Blood pressure is controlled on her current medication which is refilled at today's visit. Notes from patient's ED visit in February of this year reviewed. BMP, CBC, and TSH were normal.  - losartan (COZAAR) 50 MG tablet; Take 1 tablet (50 mg total) by mouth 2 (two) times daily.  Dispense: 180 tablet; Refill: 1  2. Toe pain, right Based on exam, I suspect that patient has arthritis in her toe but will have her obtain an x-ray of her foot if she has continued pain.  - DG Foot Complete Right; Future  3. Epidermal cyst of neck She reports a history of cysts in her neck not the thyroid and needs imaging in follow-up. No prior medical records  - US Soft Tissue Head/Neck (NON-THYROID); Future  4. Hearing loss due to cerumen impaction, bilateral Information given on cerumen impaction and patient can try the use of otc debrox but if no improvement then patient can make a nurse visit for ear irrigation.     Follow-up: Return for HTN- 6 months and as needed.   Cain Saupe, MD

## 2020-03-19 ENCOUNTER — Ambulatory Visit: Payer: Self-pay | Admitting: Nurse Practitioner

## 2020-03-21 ENCOUNTER — Telehealth: Payer: Self-pay | Admitting: Family Medicine

## 2020-03-21 NOTE — Telephone Encounter (Signed)
Copied from CRM [id number]. Topic: General - Other >> Mar 21, 2020  3:33 PM Tamela Oddi wrote: Reason for CRM: Patient's relative called to check the status of a referral for his mother.  Please call back Reuel Boom to discuss at [phone].

## 2020-03-25 NOTE — Telephone Encounter (Signed)
I Spoke to patient son and he is aware of US Neck and also the order pending for Xray  toe  .

## 2020-03-27 ENCOUNTER — Telehealth: Payer: Self-pay

## 2020-03-27 NOTE — Telephone Encounter (Signed)
Att to contact pt thru [employer] interpreters [place] [phone]#[id number] to advise of appt 10/25 of U/S unable to lvm no mailbox set up.per telephone note 10/15 pt son aware of appt

## 2020-03-31 ENCOUNTER — Ambulatory Visit (HOSPITAL_COMMUNITY)
Admission: RE | Admit: 2020-03-31 | Discharge: 2020-03-31 | Disposition: A | Discharge status: Home / Self Care | Payer: Self-pay | Source: Ambulatory Visit | Attending: Family Medicine | Admitting: Family Medicine

## 2020-03-31 ENCOUNTER — Other Ambulatory Visit: Payer: Self-pay

## 2020-03-31 DIAGNOSIS — M79674 Pain in right toe(s): Secondary | ICD-10-CM | POA: Insufficient documentation

## 2020-03-31 DIAGNOSIS — L720 Epidermal cyst: Secondary | ICD-10-CM | POA: Insufficient documentation

## 2020-04-07 ENCOUNTER — Telehealth (INDEPENDENT_AMBULATORY_CARE_PROVIDER_SITE_OTHER): Payer: Self-pay | Admitting: Family Medicine

## 2020-04-07 NOTE — Telephone Encounter (Signed)
Copied from CRM [id number]. Topic: General - Other >> Apr 07, 2020 12:15 PM Jaquita Rector A wrote: Reason for CRM: Patient son Lafayette Dragon called to inquire about the results of the X ray done on the patients neck. Please call Ph# [phone]

## 2020-04-08 NOTE — Telephone Encounter (Signed)
These have aready been reviewed and it appears that you already attempted to contact the patient via an interpreter

## 2020-04-12 ENCOUNTER — Emergency Department (HOSPITAL_COMMUNITY): Payer: Self-pay

## 2020-04-12 ENCOUNTER — Other Ambulatory Visit: Payer: Self-pay

## 2020-04-12 ENCOUNTER — Emergency Department (HOSPITAL_COMMUNITY)
Admission: EM | Admit: 2020-04-12 | Discharge: 2020-04-12 | Disposition: A | Discharge status: Home / Self Care | Payer: Self-pay | Attending: Emergency Medicine | Admitting: Emergency Medicine

## 2020-04-12 DIAGNOSIS — Z79899 Other long term (current) drug therapy: Secondary | ICD-10-CM | POA: Insufficient documentation

## 2020-04-12 DIAGNOSIS — R0789 Other chest pain: Secondary | ICD-10-CM | POA: Insufficient documentation

## 2020-04-12 DIAGNOSIS — M549 Dorsalgia, unspecified: Secondary | ICD-10-CM | POA: Insufficient documentation

## 2020-04-12 DIAGNOSIS — I10 Essential (primary) hypertension: Secondary | ICD-10-CM | POA: Insufficient documentation

## 2020-04-12 LAB — I-STAT CHEM 8, ED
BUN: 18 mg/dL (ref 8–23)
Calcium, Ion: 1.2 mmol/L (ref 1.15–1.40)
Chloride: 104 mmol/L (ref 98–111)
Creatinine, Ser: 0.6 mg/dL (ref 0.44–1.00)
Glucose, Bld: 91 mg/dL (ref 70–99)
HCT: 41 % (ref 36.0–46.0)
Hemoglobin: 13.9 g/dL (ref 12.0–15.0)
Potassium: 3.9 mmol/L (ref 3.5–5.1)
Sodium: 142 mmol/L (ref 135–145)
TCO2: 27 mmol/L (ref 22–32)

## 2020-04-12 MED ORDER — LIDOCAINE 5 % EX PTCH: 1.0000 | MEDICATED_PATCH | CUTANEOUS | 0 refills | Status: DC

## 2020-04-12 MED ORDER — IOHEXOL 300 MG/ML  SOLN
75.0000 mL | Freq: Once | INTRAMUSCULAR | Status: AC | PRN
  Administered 2020-04-12: 75 mL via INTRAVENOUS

## 2020-04-12 MED ORDER — LIDOCAINE 5 % EX PTCH
2.0000 | MEDICATED_PATCH | CUTANEOUS | Status: DC
  Administered 2020-04-12: 2 via TRANSDERMAL
  Filled 2020-04-12: qty 2

## 2020-04-12 MED ORDER — DICLOFENAC SODIUM 1 % EX GEL: 2.0000 g | Freq: Four times a day (QID) | CUTANEOUS | 0 refills | Status: DC

## 2020-04-12 NOTE — ED Notes (Signed)
Walked patient to the bathroom patient did well 

## 2020-04-12 NOTE — ED Notes (Signed)
Assuming care of patient at this time. Pt is ambulatory with steady gait. Tenderness assessed to left lower ribs. No bruising assessed. Resp even and unlabored. Skin warm and dry. Son at bedside.
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
## 2020-04-23 ENCOUNTER — Telehealth: Payer: Self-pay | Admitting: Physician Assistant

## 2020-04-28 ENCOUNTER — Ambulatory Visit: Payer: Self-pay | Admitting: Family Medicine

## 2020-05-06 ENCOUNTER — Ambulatory Visit: Payer: Self-pay | Admitting: Pharmacist

## 2020-05-12 ENCOUNTER — Other Ambulatory Visit: Payer: Self-pay

## 2020-05-12 ENCOUNTER — Ambulatory Visit: Payer: Self-pay | Attending: Physician Assistant | Admitting: Physician Assistant

## 2020-05-12 ENCOUNTER — Other Ambulatory Visit: Payer: Self-pay | Admitting: Physician Assistant

## 2020-05-12 ENCOUNTER — Encounter: Payer: Self-pay | Admitting: Physician Assistant

## 2020-05-12 VITALS — BP 113/72 | HR 74 | Temp 98.7°F | Resp 18 | Ht 59.0 in | Wt 133.0 lb

## 2020-05-12 DIAGNOSIS — Z1159 Encounter for screening for other viral diseases: Secondary | ICD-10-CM

## 2020-05-12 DIAGNOSIS — Z13220 Encounter for screening for lipoid disorders: Secondary | ICD-10-CM

## 2020-05-12 DIAGNOSIS — I10 Essential (primary) hypertension: Secondary | ICD-10-CM

## 2020-05-12 DIAGNOSIS — Z114 Encounter for screening for human immunodeficiency virus [HIV]: Secondary | ICD-10-CM

## 2020-05-12 DIAGNOSIS — Z23 Encounter for immunization: Secondary | ICD-10-CM

## 2020-05-12 DIAGNOSIS — M79674 Pain in right toe(s): Secondary | ICD-10-CM

## 2020-05-12 DIAGNOSIS — B372 Candidiasis of skin and nail: Secondary | ICD-10-CM

## 2020-05-12 MED ORDER — MELOXICAM 7.5 MG PO TABS: 7.5000 mg | ORAL_TABLET | Freq: Every day | ORAL | 0 refills | Status: DC

## 2020-05-12 MED ORDER — NYSTATIN-TRIAMCINOLONE 100000-0.1 UNIT/GM-% EX OINT: 1.0000 "application " | TOPICAL_OINTMENT | Freq: Two times a day (BID) | CUTANEOUS | 0 refills | Status: DC

## 2020-05-12 NOTE — Progress Notes (Signed)
Established Patient Office Visit  Subjective:  Patient ID: Claudia Hernandez, female    DOB: [birth year]  Age: 69 y.o. MRN: [id number]  CC:  Chief Complaint  Patient presents with  . Breast Pain    HPI [facility] Claudia Hernandez reports that she has been having a rash on the left side of her back for the past 7-8 months, describes it as itchy and burning Has not tried anything for relief; worse at night   Denies any new lotions, soaps, medications, detergents, fragrances, body washes.  Reports that she continues to have pain in her second toe right foot, states that she has had some discoloration on the toe that has continued, understands her x-ray showed arthritis.  Due to language barrier, an interpreter was present during the history-taking and subsequent discussion (and for part of the physical exam) with this patient.   Past Medical History:  Diagnosis Date  . Arthritis   . Hypertension     Past Surgical History:  Procedure Laterality Date  . APPENDECTOMY    . BLADDER SURGERY    . gallbladder removed    . left foot surgery      Family History  Problem Relation Age of Onset  . Drug abuse Neg Hx   . Hypertension Neg Hx     Social History   Socioeconomic History  . Marital status: Single    Spouse name: Not on file  . Number of children: Not on file  . Years of education: Not on file  . Highest education level: Not on file  Occupational History  . Not on file  Tobacco Use  . Smoking status: Never Smoker  . Smokeless tobacco: Never Used  Substance and Sexual Activity  . Alcohol use: Never  . Drug use: Never  . Sexual activity: Not Currently  Other Topics Concern  . Not on file  Social History Narrative  . Not on file   Social Determinants of Health   Financial Resource Strain:   . Difficulty of Paying Living Expenses: Not on file  Food Insecurity:   . Worried About Programme researcher, broadcasting/film/video in the Last Year: Not on file  . Ran Out of Food  in the Last Year: Not on file  Transportation Needs:   . Lack of Transportation (Medical): Not on file  . Lack of Transportation (Non-Medical): Not on file  Physical Activity:   . Days of Exercise per Week: Not on file  . Minutes of Exercise per Session: Not on file  Stress:   . Feeling of Stress : Not on file  Social Connections:   . Frequency of Communication with Friends and Family: Not on file  . Frequency of Social Gatherings with Friends and Family: Not on file  . Attends Religious Services: Not on file  . Active Member of Clubs or Organizations: Not on file  . Attends Banker Meetings: Not on file  . Marital Status: Not on file  Intimate Partner Violence:   . Fear of Current or Ex-Partner: Not on file  . Emotionally Abused: Not on file  . Physically Abused: Not on file  . Sexually Abused: Not on file    Outpatient Medications Prior to Visit  Medication Sig Dispense Refill  . ACETAMINOPHEN PO Take by mouth.    . COLLAGEN PO Take by mouth.    . diclofenac Sodium (VOLTAREN) 1 % GEL Apply 2 g topically 4 (four) times daily. 100 g 0  . lidocaine (  LIDODERM) 5 % Place 1 patch onto the skin daily. Remove & Discard patch within 12 hours or as directed by MD 30 patch 0  . losartan (COZAAR) 50 MG tablet Take 1 tablet (50 mg total) by mouth 2 (two) times daily. 180 tablet 1   No facility-administered medications prior to visit.    No Known Allergies  ROS Review of Systems  Constitutional: Negative.   HENT: Negative.   Eyes: Negative.   Respiratory: Negative.   Cardiovascular: Negative.   Gastrointestinal: Negative.   Endocrine: Negative.   Genitourinary: Negative.   Musculoskeletal: Positive for arthralgias and gait problem.  Skin: Positive for color change and rash.  Allergic/Immunologic: Negative.   Hematological: Negative.   Psychiatric/Behavioral: Negative.       Objective:    Physical Exam Vitals and nursing note reviewed.  Constitutional:       Appearance: Normal appearance.  HENT:     Head: Normocephalic and atraumatic.     Right Ear: External ear normal.     Left Ear: External ear normal.     Nose: Nose normal.     Mouth/Throat:     Mouth: Mucous membranes are moist.     Pharynx: Oropharynx is clear.  Eyes:     Extraocular Movements: Extraocular movements intact.     Conjunctiva/sclera: Conjunctivae normal.     Pupils: Pupils are equal, round, and reactive to light.  Cardiovascular:     Rate and Rhythm: Normal rate and regular rhythm.     Pulses: Normal pulses.     Heart sounds: Normal heart sounds.  Pulmonary:     Effort: Pulmonary effort is normal.     Breath sounds: Normal breath sounds.  Abdominal:     General: Abdomen is flat.     Palpations: Abdomen is soft.  Musculoskeletal:        General: Normal range of motion.     Cervical back: Normal range of motion and neck supple.  Feet:     Comments: See photo  Skin:    General: Skin is warm.     Comments: See photo  Neurological:     General: No focal deficit present.     Mental Status: She is alert and oriented to person, place, and time.  Psychiatric:        Mood and Affect: Mood normal.        Behavior: Behavior normal.        Thought Content: Thought content normal.        Judgment: Judgment normal.          BP 113/72 (BP Location: Left Arm, Patient Position: Sitting, Cuff Size: Normal)   Pulse 74   Temp 98.7 F (37.1 C) (Oral)   Resp 18   Ht 4\' 11"  (1.499 m)   Wt 133 lb (60.3 kg)   SpO2 98%   BMI 26.86 kg/m  Wt Readings from Last 3 Encounters:  05/12/20 133 lb (60.3 kg)  03/10/20 136 lb (61.7 kg)  07/20/19 139 lb (63 kg)     Health Maintenance Due  Topic Date Due  . Hepatitis C Screening  Never done  . COVID-19 Vaccine (1) Never done  . TETANUS/TDAP  Never done  . MAMMOGRAM  Never done  . COLONOSCOPY  Never done  . DEXA SCAN  Never done  . PNA vac Low Risk Adult (1 of 2 - PCV13) Never done  . INFLUENZA VACCINE  Never done     There are no preventive care  reminders to display for this patient.  Lab Results  Component Value Date   TSH 1.870 07/20/2019   Lab Results  Component Value Date   WBC 8.5 06/30/2019   HGB 13.9 04/12/2020   HCT 41.0 04/12/2020   MCV 102.5 (H) 06/30/2019   PLT 257 06/30/2019   Lab Results  Component Value Date   NA 142 04/12/2020   K 3.9 04/12/2020   CO2 25 07/20/2019   GLUCOSE 91 04/12/2020   BUN 18 04/12/2020   CREATININE 0.60 04/12/2020   CALCIUM 9.4 07/20/2019   ANIONGAP 9 06/30/2019   No results found for: CHOL No results found for: HDL No results found for: LDLCALC No results found for: TRIG No results found for: CHOLHDL No results found for: KWIO9B    Assessment & Plan:   Problem List Items Addressed This Visit    None    1. Toe pain, right Trial Mobic, patient is concerned over discoloration, refer to podiatry for further evaluation - meloxicam (MOBIC) 7.5 MG tablet; Take 1 tablet (7.5 mg total) by mouth daily.  Dispense: 30 tablet; Refill: 0 - Ambulatory referral to Podiatry  2. Yeast infection of the skin Trial Mycolog, patient education given to keep skin clean and dry - nystatin-triamcinolone ointment (MYCOLOG); Apply 1 application topically 2 (two) times daily.  Dispense: 30 g; Refill: 0  3. Screening, lipid  - Lipid panel; Future  4. Encounter for HCV screening test for low risk patient  - HCV Ab w/Rflx to Verification; Future  5. Screening for HIV (human immunodeficiency virus)  - HIV antibody (with reflex); Future  6. Essential hypertension  - Comp. Metabolic Panel (12); Future - CBC with Differential/Platelet; Future  7. Need for immunization against influenza  - Flu Vaccine QUAD 36+ mos IM  Patient due for fasting labs, patient to return to clinic for fasting labs   I have reviewed the patient's medical history (PMH, PSH, Social History, Family History, Medications, and allergies) , and have been updated if relevant. I  spent 20 minutes reviewing chart and  face to face time with patient.    No orders of the defined types were placed in this encounter.   Follow-up: No follow-ups on file.    Kasandra Knudsen Mayers, PA-C

## 2020-05-12 NOTE — Patient Instructions (Signed)
For your toe pain, I have started a referral for you to be seen by a foot specialist, I have prescribed Mobic 7.5 mg to take once daily for the pain.  For your rash, I have prescribed a cream to help with the overgrowth of yeast on your skin as well as the itching.  Please return to the clinic for fasting labs, we will call you with your results  Roney Jaffe, PA-C Physician Assistant [facility] [email or web address]     Infecciones por hongos en la piel Skin Yeast Infection  La infeccin por hongos en la piel es un trastorno en el que hay un desarrollo excesivo de unos hongos (cndida) que viven normalmente en la piel. Por lo general, este tipo de infeccin ocurre en reas de la piel que estn constantemente clidas y [address], como las axilas o la ingle. Cules son las causas? La causa de la afeccin es un cambio en el equilibrio normal de los hongos y las bacterias que viven en la piel. Qu incrementa el riesgo? Es ms probable que tenga esta afeccin si:  Tiene obesidad.  Est embarazada.  Toma anticonceptivos orales.  Tiene diabetes.  Toma antibiticos.  Toma medicamentos con corticoesteroides.  Est desnutrido.  Tiene debilitado el sistema de defensa del organismo (sistema inmunitario).  Tiene 66aos o ms.  [place] ropa ajustada. Cules son los signos o los sntomas? El sntoma ms frecuente de esta afeccin es picazn en la zona afectada. Otros sntomas pueden incluir los siguientes:  Zona de la piel roja e hinchada.  Bultos en la piel. Cmo se diagnostica?  Esta afeccin se diagnostica mediante una revisin de los antecedentes mdicos y un examen fsico.  El mdico puede raspar ligeramente la piel para tomar Lauris Poag y analizarla con un microscopio a fin de Production assistant, radio presencia de hongos. Cmo se trata? Esta afeccin se trata con medicamentos. Los [employer] pueden ser de venta libre o con  receta. Estos medicamentos pueden administrarse de la siguiente manera:  Por boca (va oral).  Mediante aplicacin en la piel, en forma de crema o polvo. Siga estas indicaciones en su casa:   Tome o aplquese los medicamentos de venta libre y los recetados solamente como se lo haya indicado el mdico.  Mantenga un peso saludable. Si necesita ayuda para bajar de peso, hable con el mdico.  Mantenga la piel limpia y [place].  Si tiene diabetes, mantenga bajo control el nivel de Banker.  Concurra a todas las visitas de control como se lo haya indicado el mdico. Esto es importante. Comunquese con un mdico si:  Los sntomas desaparecen y luego vuelven a Research officer, trade union.  Los sntomas no mejoran con Scientist, research (medical).  Sus sntomas empeoran.  La erupcin se extiende.  Tiene fiebre o escalofros.  Aparecen nuevos sntomas.  Tiene una nueva zona de enrojecimiento o calor en la piel. Resumen  La infeccin por hongos en la piel es un trastorno en el que hay un desarrollo excesivo de unos hongos (cndida) que viven normalmente en la piel. La causa de la afeccin es un cambio en el equilibrio normal de los hongos y las bacterias que viven en la piel.  Tome o aplquese los medicamentos de venta libre y los recetados solamente como se lo haya indicado el mdico.  Mantenga la piel limpia y [place].  Comunquese con un mdico si los sntomas no mejoran con el tratamiento. Esta informacin no tiene Theme park manager el consejo del mdico. Asegrese de hacerle  al mdico cualquier pregunta que tenga. Document Revised: 12/01/2017 Document Reviewed: 12/01/2017 Elsevier Patient Education  2020 [employer].

## 2020-05-12 NOTE — Progress Notes (Signed)
Patient has taken medication today and patient has eaten today. Patient denies pain at this time Patient is aware of thyroid US being normal and at this time the nodules do not meet criteria to for sampling. Patient is aware of no fracture or cancer. Patient is aware of arthritis in the big toe. Patient complains of dark area on the left a side of her back 6 months ago. Described as itchy and burning.

## 2020-05-13 ENCOUNTER — Ambulatory Visit: Payer: Self-pay | Admitting: Family

## 2020-05-13 DIAGNOSIS — I10 Essential (primary) hypertension: Secondary | ICD-10-CM | POA: Insufficient documentation

## 2020-05-13 DIAGNOSIS — B372 Candidiasis of skin and nail: Secondary | ICD-10-CM | POA: Insufficient documentation

## 2020-05-13 DIAGNOSIS — M79674 Pain in right toe(s): Secondary | ICD-10-CM | POA: Insufficient documentation

## 2020-05-13 MED FILL — MELOXICAM 7.5 MG TABLET: 7.5 | 30 days supply | Qty: 30 | Fill #0

## 2020-05-13 MED FILL — NYSTATIN-TRIAMCINOLONE OINT: 100000-0.1 | 14 days supply | Qty: 30 | Fill #0

## 2020-05-28 ENCOUNTER — Ambulatory Visit: Payer: Self-pay | Admitting: Physician Assistant

## 2020-06-09 MED FILL — MELOXICAM 7.5 MG TABLET: 7.5 | 30 days supply | Qty: 30 | Fill #0

## 2020-06-09 MED FILL — NYSTATIN-TRIAMCINOLONE OINT: 100000-0.1 | 14 days supply | Qty: 30 | Fill #0

## 2020-06-11 ENCOUNTER — Other Ambulatory Visit: Payer: Self-pay

## 2020-06-11 ENCOUNTER — Ambulatory Visit: Payer: Self-pay | Attending: Family Medicine

## 2020-06-11 ENCOUNTER — Telehealth: Payer: Self-pay | Admitting: Podiatry

## 2020-06-11 DIAGNOSIS — Z13220 Encounter for screening for lipoid disorders: Secondary | ICD-10-CM

## 2020-06-11 DIAGNOSIS — Z114 Encounter for screening for human immunodeficiency virus [HIV]: Secondary | ICD-10-CM

## 2020-06-11 DIAGNOSIS — Z1159 Encounter for screening for other viral diseases: Secondary | ICD-10-CM

## 2020-06-11 DIAGNOSIS — I10 Essential (primary) hypertension: Secondary | ICD-10-CM

## 2020-06-11 NOTE — Telephone Encounter (Signed)
Nurse from [facility] called in on behalf of patient to get appointment scheduled from incoming referral. Patient letter for orange card has expired and once obtained patient will call back to schedule appointment. Please don't expire or cancel appointment, patient does want to be seen.

## 2020-06-12 LAB — COMP. METABOLIC PANEL (12)
AST: 23 IU/L (ref 0–40)
Albumin/Globulin Ratio: 1.7 (ref 1.2–2.2)
Albumin: 4.3 g/dL (ref 3.8–4.8)
Alkaline Phosphatase: 117 IU/L (ref 44–121)
BUN/Creatinine Ratio: 12 (ref 12–28)
BUN: 7 mg/dL — ABNORMAL LOW (ref 8–27)
Bilirubin Total: 0.4 mg/dL (ref 0.0–1.2)
Calcium: 9.3 mg/dL (ref 8.7–10.3)
Chloride: 104 mmol/L (ref 96–106)
Creatinine, Ser: 0.59 mg/dL (ref 0.57–1.00)
GFR calc Af Amer: 109 mL/min/{1.73_m2} (ref >59)
GFR calc non Af Amer: 94 mL/min/{1.73_m2} (ref >59)
Globulin, Total: 2.5 g/dL (ref 1.5–4.5)
Glucose: 89 mg/dL (ref 65–99)
Potassium: 4.3 mmol/L (ref 3.5–5.2)
Sodium: 140 mmol/L (ref 134–144)
Total Protein: 6.8 g/dL (ref 6.0–8.5)

## 2020-06-12 LAB — CBC WITH DIFFERENTIAL/PLATELET
Basophils Absolute: 0.1 10*3/uL (ref 0.0–0.2)
Basos: 1 %
EOS (ABSOLUTE): 0.6 10*3/uL — ABNORMAL HIGH (ref 0.0–0.4)
Eos: 10 %
Hematocrit: 41.3 % (ref 34.0–46.6)
Hemoglobin: 13.8 g/dL (ref 11.1–15.9)
Immature Grans (Abs): 0 10*3/uL (ref 0.0–0.1)
Immature Granulocytes: 1 %
Lymphocytes Absolute: 2.3 10*3/uL (ref 0.7–3.1)
Lymphs: 36 %
MCH: 35.4 pg — ABNORMAL HIGH (ref 26.6–33.0)
MCHC: 33.4 g/dL (ref 31.5–35.7)
MCV: 106 fL — ABNORMAL HIGH (ref 79–97)
Monocytes Absolute: 0.4 10*3/uL (ref 0.1–0.9)
Monocytes: 6 %
Neutrophils Absolute: 3 10*3/uL (ref 1.4–7.0)
Neutrophils: 46 %
Platelets: 298 10*3/uL (ref 150–450)
RBC: 3.9 x10E6/uL (ref 3.77–5.28)
RDW: 15 % (ref 11.7–15.4)
WBC: 6.3 10*3/uL (ref 3.4–10.8)

## 2020-06-12 LAB — LIPID PANEL
Chol/HDL Ratio: 4.4 ratio (ref 0.0–4.4)
Cholesterol, Total: 179 mg/dL (ref 100–199)
HDL: 41 mg/dL (ref >39)
LDL Chol Calc (NIH): 115 mg/dL — ABNORMAL HIGH (ref 0–99)
Triglycerides: 128 mg/dL (ref 0–149)
VLDL Cholesterol Cal: 23 mg/dL (ref 5–40)

## 2020-06-12 LAB — HIV ANTIBODY (ROUTINE TESTING W REFLEX): HIV Screen 4th Generation wRfx: NONREACTIVE

## 2020-06-12 LAB — HCV INTERPRETATION

## 2020-06-12 LAB — HCV AB W/RFLX TO VERIFICATION: HCV Ab: <0.1 s/co ratio (ref 0.0–0.9)

## 2020-06-15 NOTE — Progress Notes (Signed)
Subjective:    Patient ID: Claudia Hernandez, female    DOB: Dec 12, 1950, 70 y.o.   MRN: [id number]  70 y.o.F here to est PCP  Former [facility] patient  06/16/2020 This is a primary care patient of Dr. Chapman Fitch who is here today to establish for primary care.  There is an in person interpreter Jamas Lav at this visit for [place]  Patient's last visit was in December when she was seen by physician assistant Mayers for right toe pain.  The patient was given a prescription for meloxicam but she did not fill this medication.  She also received topical cream for a yeast infection of the back which did help to some degree but is still persisting.  The patient's been monitored for hypertension and on arrival is 132/82  The patient states her right foot continues to hurt.  It is primarily in the second toe next to the right great toe.  It is swollen.  She denies any other arthritic changes throughout her note the patient had screening labs already obtained last week body.  Note she is status postcholecystectomy and had previous left surgery of the foot in [place] in the past.  On review of systems the patient has no other specific complaints except decreased hearing.  She has no prior history of diabetes.  The patient is on losartan 50 mg twice daily for hypertension she sometimes forgets to take the second dose of the day.  The patient has a long list of primary care screening gaps.  She does need a colonoscopy, DEXA scan, mammogram.  She is due a pneumonia vaccine and has already received her flu vaccine also she is fully vaccinated for COVID including her booster    Past Medical History:  Diagnosis Date  . Arthritis   . Hypertension      Family History  Problem Relation Age of Onset  . Drug abuse Neg Hx   . Hypertension Neg Hx      Social History   Socioeconomic History  . Marital status: Single    Spouse name: Not on file  . Number of children: Not on file  . Years of education:  Not on file  . Highest education level: Not on file  Occupational History  . Not on file  Tobacco Use  . Smoking status: Never Smoker  . Smokeless tobacco: Never Used  Substance and Sexual Activity  . Alcohol use: Never  . Drug use: Never  . Sexual activity: Not Currently  Other Topics Concern  . Not on file  Social History Narrative  . Not on file   Social Determinants of Health   Financial Resource Strain: Not on file  Food Insecurity: Not on file  Transportation Needs: Not on file  Physical Activity: Not on file  Stress: Not on file  Social Connections: Not on file  Intimate Partner Violence: Not on file     No Known Allergies   Outpatient Medications Prior to Visit  Medication Sig Dispense Refill  . ACETAMINOPHEN PO Take by mouth.    . COLLAGEN PO Take by mouth.    . lidocaine (LIDODERM) 5 % Place 1 patch onto the skin daily. Remove & Discard patch within 12 hours or as directed by MD 30 patch 0  . losartan (COZAAR) 50 MG tablet Take 1 tablet (50 mg total) by mouth 2 (two) times daily. 180 tablet 1  . meloxicam (MOBIC) 7.5 MG tablet Take 1 tablet (7.5 mg total) by mouth daily.  30 tablet 0  . nystatin-triamcinolone ointment (MYCOLOG) Apply 1 application topically 2 (two) times daily. 30 g 0  . diclofenac Sodium (VOLTAREN) 1 % GEL Apply 2 g topically 4 (four) times daily. (Patient not taking: Reported on 06/16/2020) 100 g 0   No facility-administered medications prior to visit.      Review of Systems  Constitutional: Negative.   HENT: Positive for hearing loss.   Eyes: Negative.   Respiratory: Negative.   Cardiovascular: Negative.   Gastrointestinal: Negative.   Endocrine: Negative.   Genitourinary: Negative.   Musculoskeletal:       Right second toe pain  Neurological: Negative.   Hematological: Negative.   Psychiatric/Behavioral: Negative.        Objective:   Physical Exam   Vitals:   06/16/20 0945  BP: 132/82  Pulse: 82  Resp: 16  SpO2: 97%   Weight: 134 lb (60.8 kg)    Gen: Pleasant, well-nourished, in no distress,  normal affect  ENT: No lesions,  mouth clear,  oropharynx clear, no postnasal drip  Neck: No JVD, no TMG, no carotid bruits  Lungs: No use of accessory muscles, no dullness to percussion, clear without rales or rhonchi  Cardiovascular: RRR, heart sounds normal, no murmur or gallops, no peripheral edema  Abdomen: soft and NT, no HSM,  BS normal  Musculoskeletal: No deformities, no cyanosis or clubbing  Neuro: alert, non focal  Skin: Warm, no lesions or rashes BMP Latest Ref Rng & Units 06/11/2020 04/12/2020 07/20/2019  Glucose 65 - 99 mg/dL 89 91 87  BUN 8 - 27 mg/dL 7(L) 18 16  Creatinine 0.57 - 1.00 mg/dL 0.59 0.60 0.55(L)  BUN/Creat Ratio 12 - 28 12 - 29(H)  Sodium 134 - 144 mmol/L 140 142 143  Potassium 3.5 - 5.2 mmol/L 4.3 3.9 4.3  Chloride 96 - 106 mmol/L 104 104 104  CO2 20 - 29 mmol/L - - 25  Calcium 8.7 - 10.3 mg/dL 9.3 - 9.4   Hepatic Function Latest Ref Rng & Units 06/11/2020  Total Protein 6.0 - 8.5 g/dL 6.8  Albumin 3.8 - 4.8 g/dL 4.3  AST 0 - 40 IU/L 23  Alk Phosphatase 44 - 121 IU/L 117  Total Bilirubin 0.0 - 1.2 mg/dL 0.4   CBC Latest Ref Rng & Units 06/11/2020 04/12/2020 06/30/2019  WBC 3.4 - 10.8 x10E3/uL 6.3 - 8.5  Hemoglobin 11.1 - 15.9 g/dL 13.8 13.9 15.2(H)  Hematocrit 34.0 - 46.6 % 41.3 41.0 44.8  Platelets 150 - 450 x10E3/uL 298 - 257   Lipid Panel     Component Value Date/Time   CHOL 179 06/11/2020 0845   TRIG 128 06/11/2020 0845   HDL 41 06/11/2020 0845   CHOLHDL 4.4 06/11/2020 0845   LDLCALC 115 (H) 06/11/2020 0845   LABVLDL 23 06/11/2020 0845        Assessment & Plan:  I personally reviewed all images and lab data in the [facility] system as well as any outside material available during this office visit and agree with the  radiology impressions.   Essential hypertension Blood pressure under good control but improved compliance will change losartan to 100 mg daily and  send refills to our pharmacy  Yeast infection of the skin Recurrent yeast infection to the skin of the upper back  Will give topical triamcinolone and antifungal cream  Toe pain, right Recurrent toe pain of the right foot we will plan referral to podiatry  Bilateral hearing loss due to cerumen impaction Right greater  than left cerumen impaction with bilateral hearing loss  We were unable to remove the wax today because as it is severely impacted  Will refer to ENT volunteer physician Dr. Wallene Dales was seen today for hearing problem and medication refill.  Diagnoses and all orders for this visit:  Encounter for health-related screening -     DG Bone Density; Future  Essential hypertension -     losartan (COZAAR) 100 MG tablet; Take 1 tablet (100 mg total) by mouth daily.  Yeast infection of the skin -     nystatin-triamcinolone ointment (MYCOLOG); Apply 1 application topically 2 (two) times daily.  Colon cancer screening -     Ambulatory referral to Gastroenterology  Right foot pain -     Ambulatory referral to Podiatry  Encounter for screening mammogram for malignant neoplasm of breast -     MM DIGITAL SCREENING BILATERAL; Future  Toe pain, right  Bilateral hearing loss due to cerumen impaction   Note HIV and hepatitis C screens were negative  Prevnar vaccine and Tdap given at this visit  Patient is already had her flu vaccine and is triple vaccinated for COVID  Patient agrees to colonoscopy will refer to gastroenterology for colonoscopy  Will schedule mammogram and DEXA scan  Note the patient has the orange and blue card and full [place] discount  Will refer to podiatry for the right foot pain

## 2020-06-16 ENCOUNTER — Other Ambulatory Visit: Payer: Self-pay

## 2020-06-16 ENCOUNTER — Other Ambulatory Visit: Payer: Self-pay | Admitting: Critical Care Medicine

## 2020-06-16 ENCOUNTER — Encounter: Payer: Self-pay | Admitting: Critical Care Medicine

## 2020-06-16 ENCOUNTER — Ambulatory Visit: Payer: Self-pay | Attending: Critical Care Medicine | Admitting: Critical Care Medicine

## 2020-06-16 VITALS — BP 132/82 | HR 82 | Resp 16 | Wt 134.0 lb

## 2020-06-16 DIAGNOSIS — I10 Essential (primary) hypertension: Secondary | ICD-10-CM

## 2020-06-16 DIAGNOSIS — Z1211 Encounter for screening for malignant neoplasm of colon: Secondary | ICD-10-CM

## 2020-06-16 DIAGNOSIS — H6123 Impacted cerumen, bilateral: Secondary | ICD-10-CM | POA: Insufficient documentation

## 2020-06-16 DIAGNOSIS — Z139 Encounter for screening, unspecified: Secondary | ICD-10-CM

## 2020-06-16 DIAGNOSIS — M79671 Pain in right foot: Secondary | ICD-10-CM

## 2020-06-16 DIAGNOSIS — B372 Candidiasis of skin and nail: Secondary | ICD-10-CM

## 2020-06-16 DIAGNOSIS — Z780 Asymptomatic menopausal state: Secondary | ICD-10-CM

## 2020-06-16 DIAGNOSIS — M79674 Pain in right toe(s): Secondary | ICD-10-CM

## 2020-06-16 DIAGNOSIS — Z1231 Encounter for screening mammogram for malignant neoplasm of breast: Secondary | ICD-10-CM

## 2020-06-16 MED ORDER — LOSARTAN POTASSIUM 100 MG PO TABS
100.0000 mg | ORAL_TABLET | Freq: Every day | ORAL | 1 refills | Status: DC
Start: 2020-06-16 — End: 2020-06-16

## 2020-06-16 MED ORDER — NYSTATIN-TRIAMCINOLONE 100000-0.1 UNIT/GM-% EX OINT: 1.0000 "application " | TOPICAL_OINTMENT | Freq: Two times a day (BID) | CUTANEOUS | 0 refills | Status: DC

## 2020-06-16 MED FILL — NYSTATIN-TRIAMCINOLONE OINT: 100000-0.1 | 7 days supply | Qty: 30 | Fill #0

## 2020-06-16 MED FILL — LOSARTAN POTASSIUM 100 MG T: 100 | 30 days supply | Qty: 30 | Fill #0

## 2020-06-16 NOTE — Assessment & Plan Note (Signed)
Recurrent yeast infection to the skin of the upper back  Will give topical triamcinolone and antifungal cream

## 2020-06-16 NOTE — Progress Notes (Signed)
Hard of hearing, left ear Results from lab test Medication refill- Losartan

## 2020-06-16 NOTE — Patient Instructions (Addendum)
Pneumonia vaccine and tetanus vaccine given  Colonoscopy will be scheduled at a later date  Bone density scan and mammogram will be ordered  Referral to foot doctor will be made  Both of your ears were cleaned if hearing difficulties persist will need to refer you to audiology  Change losartan 100 mg daily this was sent to our pharmacy  Refill on steroid fungal cream sent to our pharmacy  Return to Dr. Delford Field in 2 months  Follow healthy diet as outlined below for your mildly elevated cholesterol   [email or web address]   Obtain Debrox and apply to both ears once daily per instructions  An appointment with Dr Lazarus Salines will be made next week for your ears and hearing   ______________________________________________________________________________________________  [place] la neumona y vacuna contra el ttanos administrada  La colonoscopia se programar en una fecha posterior  Se ordenar una densitometra sea y [place].  Se har la derivacin al mdico de los pies.  Le limpiaron ambos odos, si las dificultades auditivas persisten, ser necesario derivarlo a Paramedic.  Cambio losartn 100 mg diarios esto fue enviado a nuestra farmacia  Reposicin de crema fngica con esteroides enviada a nuestra farmacia  Regreso al Dr. Delford Field en 2 meses  Siga una dieta saludable como se describe a continuacin para su colesterol levemente elevado  Obtenga Debrox y aplquelo en ambos odos una vez al da segn las instrucciones.  Se programar una cita con el Dr. Lazarus Salines la prxima semana para sus odos y audicin    Plan de alimentacin DASH DASH Eating Plan DASH es la sigla en ingls de "Enfoques Alimentarios para Detener la Hipertensin". El plan de alimentacin DASH ha demostrado:  Bajar la presin arterial elevada (hipertensin).  Reducir el riesgo de diabetes tipo 2, enfermedad cardaca y accidente  cerebrovascular.  Ayudar a perder peso. Consejos para seguir Consulting civil engineer las etiquetas de los alimentos  Verifique la cantidad de sal (sodio) por porcin en las etiquetas de los alimentos. Elija alimentos con menos del 5 por ciento del valor diario de sodio. Generalmente, los alimentos con menos de 300 miligramos (mg) de sodio por porcin se encuadran dentro de este plan alimentario.  Para encontrar cereales integrales, busque la palabra "integral" como primera palabra en la lista de ingredientes. Al ir de compras  Compre productos en los que en su etiqueta diga: "bajo contenido de sodio" o "sin agregado de sal".  Compre alimentos frescos. Evite los alimentos enlatados y comidas precocidas o congeladas. Al cocinar  Evite agregar sal cuando cocine. Use hierbas o aderezos sin sal, en lugar de sal de mesa o sal marina. Consulte al mdico o farmacutico antes de usar sustitutos de la sal.  No fra los alimentos. A la hora de cocinar los alimentos opte por hornearlos, hervirlos, grillarlos, asarlos al horno y asarlos a Patent attorney.  Cocine con aceites cardiosaludables, como oliva, canola, aguacate, soja o girasol. Planificacin de las comidas  Consuma una dieta equilibrada, que incluya lo siguiente: ? 4o ms porciones de frutas y 4 o ms porciones de Warehouse manager. Trate de que medio plato de cada comida sea de frutas y verduras. ? De 6 a 8porciones de cereales [employer]. ? Menos de 6 onzas (170g) de carne, aves o pescado Copy. Una porcin de 3 onzas (85g) de carne tiene casi el mismo tamao que un mazo de cartas. Un huevo equivale a 1 onza (28g). ? De 2 a 3 porciones de productos lcteos descremados  por Futures trader. Una porcin es 1taza ( ). ? 1 porcin de frutos secos, semillas o frijoles 5 veces por semana. ? De 2 a 3 porciones de grasas cardiosaludables. Las grasas saludables llamadas cidos grasos omega-3 se encuentran en alimentos como las nueces, las  semillas de [place], las leches fortificadas y [place]. Estas grasas tambin se encuentran en los pescados de agua fra, como la sardina, el salmn y la caballa.  Limite la cantidad que consume de: ? Alimentos enlatados o envasados. ? Alimentos con alto contenido de grasa trans, como algunos alimentos fritos. ? Alimentos con alto contenido de grasa saturada, como carne con grasa. ? Postres y otros dulces, bebidas azucaradas y otros alimentos con azcar agregada. ? Productos lcteos enteros.  No le agregue sal a los alimentos antes de probarlos.  No coma ms de 4 yemas de huevo por semana.  Trate de comer al menos 2 comidas vegetarianas por semana.  Consuma ms comida casera y menos de restaurante, de bares y comida rpida.   Estilo de vida  Cuando coma en un restaurante, pida que preparen su comida con menos sal o, en lo posible, sin nada de sal.  Si bebe alcohol: ? Limite la cantidad que bebe:  De 0 a 1 medida por da para las mujeres que no estn embarazadas.  De 0 a 2 medidas por da para los hombres. ? Est atento a la cantidad de alcohol que hay en las bebidas que toma. En los [place], una medida equivale a una botella de cerveza de 12oz ( ), un vaso de vino de 5oz ( ) o un vaso de una bebida alcohlica de alta graduacin de 1oz (99ml). Informacin general  Evite ingerir ms de 2300 mg de sal por da. Si tiene hipertensin, es posible que necesite reducir la ingesta de sodio a 1,500 mg por da.  Trabaje con su mdico para mantener un peso saludable o perder [employer]. Pregntele cul es el peso recomendado para usted.  Realice al menos 30 minutos de ejercicio que haga que se acelere su corazn (ejercicio Magazine features editor) la [employer] de la [place]. Estas actividades pueden incluir caminar, nadar o andar en bicicleta.  Trabaje con su mdico o nutricionista para ajustar su plan alimentario a sus necesidades calricas personales. Qu alimentos debo  comer? Frutas Todas las frutas frescas, congeladas o disecadas. Frutas enlatadas en jugo natural (sin agregado de azcar). Verduras Verduras frescas o congeladas (crudas, al vapor, asadas o grilladas). Jugos de tomate y verduras con bajo contenido de sodio o reducidos en sodio. Salsa y pasta de tomate con bajo contenido de sodio o reducidas en sodio. Verduras enlatadas con bajo contenido de sodio o reducidas en sodio. Granos Pan de salvado o integral. Pasta de salvado o integral. Arroz integral. Avena. Quinua. Trigo burgol. Cereales integrales y con bajo contenido de [place]. Pan pita. Galletitas de [place] con bajo contenido de [place] (the [place] of 60 deg [place]) y [place]. Tortillas de [place] integral. Carnes y otras protenas Pollo o pavo sin piel. Carne de pollo o de [place]. Cerdo desgrasado. Pescado y [employer]. Claras de huevo. Porotos, guisantes o lentejas secos. Frutos secos, mantequilla de frutos secos y semillas sin sal. Frijoles enlatados sin sal. Cortes de carne vacuna magra, desgrasada. Carne precocida o curada magra y baja en sodio, como embutidos o panes de carne. Lcteos Leche descremada (1%) o descremada. Quesos reducidos en grasa, con bajo contenido de grasa o descremados. Queso blanco o ricota sin grasa, con bajo contenido de [place]. Yogur semidescremado o descremado. Queso con  bajo contenido de [place] (the [place] of [place] deg [place])grasa y Rocky Pointsodio. Grasas y Hershey Companyaceites Margarinas untables que no contengan grasas trans. Aceite vegetal. Jerolyn ShinMayonesa y aderezos para ensaladas livianos, reducidos en grasa o con bajo contenido de grasas (reducidos en sodio). Aceite de canola, crtamo, oliva, aguacate, soja y Mappsburggirasol. Aguacate. Alios y condimentos Hierbas. Especias. Mezclas de condimentos sin sal. Otros alimentos Palomitas de maz y pretzels sin sal. Dulces con bajo contenido de grasas. Es posible que los productos que se enumeran ms Seychellesarriba no constituyan una lista completa de los alimentos y las bebidas que puede tomar. Consulte a un nutricionista para obtener  ms informacin. Qu alimentos debo evitar? Nils PyleFrutas Fruta enlatada en almbar liviano o espeso. Frutas cocidas en aceite. Frutas con salsa de crema o mantequilla. Verduras Verduras con crema o fritas. Verduras en salsa de [place]. Verduras enlatadas regulares (que no sean con bajo contenido de sodio o reducidas en sodio). Pasta y salsa de tomates enlatadas regulares (que no sean con bajo contenido de sodio o reducidas en sodio). Jugos de tomate y verduras regulares (que no sean con bajo contenido de sodio o reducidos en sodio). Pepinillos. Aceitunas. Granos Productos de panificacin hechos con grasa, como medialunas, magdalenas y algunos panes. Comidas con arroz o pasta seca listas para usar. Carnes y [address]otras protenas Cortes de carne con alto contenido de Holiday representativegrasa. Costillas. Carne frita. [place]. [place], salame y otras carnes precocidas o curadas, como embutidos o panes de carne. Grasa de la espalda del cerdo (panceta). Salchicha de cerdo. Frutos secos y semillas con sal. Frijoles enlatados con agregado de sal. Pescado enlatado o ahumado. Huevos enteros o yemas. Pollo o pavo con piel. Lcteos Leche entera o al 2%, crema y [address]mit[address] leche y mitad crema. Queso crema entero o con toda su grasa. Yogur entero o endulzado. Quesos con toda su grasa. Sustitutos de cremas no lcteas. Coberturas batidas. Quesos para untar y quesos procesados. Grasas y Barnes & Nobleaceites Mantequilla. Margarina en barra. Manteca de cerdo. Lardo. Mantequilla clarificada. Grasa de panceta. Aceites tropicales como aceite de coco, palmiste o palma. Alios y condimentos Sal de cebolla, sal de ajo, sal condimentada, sal de mesa y sal marina. Salsa Worcestershire. Salsa trtara. Salsa barbacoa. Salsa teriyaki. Salsa de soja, incluso la que tiene contenido reducido de Glenwoodsodio. Salsa de carne. Salsas en lata y envasadas. Salsa de pescado. Salsa de [place]ersvilleostras. Salsa rosada. Rbanos picantes comprados en tiendas. [place]. [place]aza. Saborizantes y tiernizantes para  carne. Caldo en cubitos. Salsas picantes. Adobos preelaborados o envasados. Aderezos para tacos preelaborados o envasados. Salsas de pepinillos. Aderezos comunes para ensalada. Otros alimentos Palomitas de maz y pretzels con sal. Es posible que los productos que se enumeran ms arriba no constituyan una lista completa de los alimentos y las bebidas que Personnel officerdebe evitar. Consulte a un nutricionista para obtener ms informacin. Dnde buscar ms informacin  National Heart, Lung, and Blood Institute (Instituto Nacional del Mobridgeorazn, los Pulmones y Risk managerla Sangre): PopSteam.iswww.nhlbi.nih.gov  American Heart Association (Asociacin Estadounidense del Corazn): www.heart.org  Academy of Nutrition and Dietetics (Academia de Nutricin y Pension scheme managerDiettica): www.eatright.org  National Kidney Foundation (Fundacin Nacional del Rin): www.kidney.org Resumen  El plan de alimentacin DASH ha demostrado bajar la presin arterial elevada (hipertensin). Tambin puede reducir Lexmark Internationalel riesgo de diabetes tipo 2, enfermedad cardaca y accidente cerebrovascular.  Cuando siga el plan de alimentacin DASH, trate de comer ms frutas frescas y verduras, cereales integrales, carnes magras, lcteos descremados y grasas cardiosaludables.  Con el plan de alimentacin DASH, deber limitar el consumo de sal (sodio) a 2,300 mg por da. Si  tiene hipertensin, es posible que necesite reducir la ingesta de sodio a 1,500 mg por da.  Trabaje con su mdico o nutricionista para ajustar su plan alimentario a sus necesidades calricas personales. Esta informacin no tiene Theme park manager el consejo del mdico. Asegrese de hacerle al mdico cualquier pregunta que tenga. Document Revised: 06/28/2019 Document Reviewed: 06/28/2019 Elsevier Patient Education  2021 Arvin[employer].

## 2020-06-16 NOTE — Assessment & Plan Note (Signed)
Right greater than left cerumen impaction with bilateral hearing loss  We were unable to remove the wax today because as it is severely impacted  Will refer to ENT volunteer physician Dr. Lazarus Salines

## 2020-06-16 NOTE — Assessment & Plan Note (Signed)
Recurrent toe pain of the right foot we will plan referral to podiatry

## 2020-06-16 NOTE — Addendum Note (Signed)
Addended by: Storm Frisk on: 06/16/2020 05:21 PM   Modules accepted: Orders

## 2020-06-16 NOTE — Assessment & Plan Note (Signed)
Blood pressure under good control but improved compliance will change losartan to 100 mg daily and send refills to our pharmacy

## 2020-06-17 ENCOUNTER — Other Ambulatory Visit: Payer: Self-pay | Admitting: Physician Assistant

## 2020-06-17 ENCOUNTER — Telehealth: Payer: Self-pay | Admitting: *Deleted

## 2020-06-17 DIAGNOSIS — E7800 Pure hypercholesterolemia, unspecified: Secondary | ICD-10-CM

## 2020-06-17 MED ORDER — ATORVASTATIN CALCIUM 10 MG PO TABS: 10.0000 mg | ORAL_TABLET | Freq: Every day | ORAL | 1 refills | Status: DC

## 2020-06-17 MED FILL — ATORVASTATIN 10 MG TABLET: 10 | 30 days supply | Qty: 30 | Fill #0

## 2020-06-17 NOTE — Telephone Encounter (Signed)
-----   Message from Roney Jaffe, [place] sent at 06/17/2020  9:17 AM EST ----- Please call patient and let her know that her kidney and liver function were within normal limits, her screening for anemia was negative.  Her screening for HIV and hepatitis C were also negative.  Her cholesterol overall was within normal limits, however she did have an elevated cholesterol marker, her risk of a cardiovascular event in the next 10 years is 11.3, it is recommended that she start a low-dose cholesterol medication.  I have sent this to her pharmacy.  I also strongly encourage a low-cholesterol diet.  The 10-year ASCVD risk score Denman George DC Montez Hageman., et al., 2013) is: 11.3%   Values used to calculate the score:     Age: 70 years     Sex: Female     Is Non-Hispanic African American: No     Diabetic: No     Tobacco smoker: No     Systolic Blood Pressure: 132 mmHg     Is BP treated: Yes     HDL Cholesterol: 41 mg/dL     Total Cholesterol: 179 mg/dL

## 2020-06-17 NOTE — Telephone Encounter (Signed)
Medical Assistant used [employer] to contact patient.  Interpreter Name: Nicholes Rough Interpreter #:  Patient is aware of labs being normal except for level being elevated in the cholesterol marker and needing to take a cholesterol medication and adhere to a low cholesterol diet to lower her risk of a cardiovascular event. No concerns at this time.

## 2020-06-18 ENCOUNTER — Other Ambulatory Visit: Payer: Self-pay

## 2020-06-18 ENCOUNTER — Ambulatory Visit: Payer: MEDICAID

## 2020-06-18 ENCOUNTER — Ambulatory Visit (INDEPENDENT_AMBULATORY_CARE_PROVIDER_SITE_OTHER): Payer: No Typology Code available for payment source

## 2020-06-18 ENCOUNTER — Ambulatory Visit (INDEPENDENT_AMBULATORY_CARE_PROVIDER_SITE_OTHER): Payer: No Typology Code available for payment source | Admitting: Podiatry

## 2020-06-18 ENCOUNTER — Other Ambulatory Visit: Payer: Self-pay | Admitting: Podiatry

## 2020-06-18 DIAGNOSIS — M79671 Pain in right foot: Secondary | ICD-10-CM

## 2020-06-18 DIAGNOSIS — M25572 Pain in left ankle and joints of left foot: Secondary | ICD-10-CM

## 2020-06-18 DIAGNOSIS — M205X1 Other deformities of toe(s) (acquired), right foot: Secondary | ICD-10-CM

## 2020-06-18 DIAGNOSIS — M19071 Primary osteoarthritis, right ankle and foot: Secondary | ICD-10-CM

## 2020-06-18 MED ORDER — MELOXICAM 15 MG PO TABS: 15.0000 mg | ORAL_TABLET | Freq: Every day | ORAL | 1 refills | Status: DC

## 2020-06-18 MED FILL — MELOXICAM 15 MG TABLET: 15 | 30 days supply | Qty: 30 | Fill #0

## 2020-06-18 NOTE — Progress Notes (Signed)
   HPI: 70 y.o. female presenting today as a new patient with her son, Reuel Boom, for evaluation of bilateral foot pain.  Patient has a history of left foot surgery performed in [place] approximately 5 years ago.  She also states that over the past several months she has had increased pain and tenderness to the right great toe.  She states that the surgeon in [place] said she would need to have surgery to the right great toe.  She is in a significant amount of pain associated to the right great toe.  She presents for further treatment and evaluation  Past Medical History:  Diagnosis Date  . Arthritis   . Hypertension      Physical Exam: General: The patient is alert and oriented x3 in no acute distress.  Dermatology: Skin is warm, dry and supple bilateral lower extremities. Negative for open lesions or macerations.  Vascular: Palpable pedal pulses bilaterally. No edema or erythema noted. Capillary refill within normal limits.  Neurological: Epicritic and protective threshold grossly intact bilaterally.   Musculoskeletal Exam: Range of motion within normal limits to all pedal and ankle joints bilateral. Muscle strength 5/5 in all groups bilateral.  Pain on palpation with limited range of motion to the first MTPJ of the right foot.  There is also pain on palpation of the second MTPJ of the right foot. Diffuse generalized pain with left rear foot.  Patient states the pain is increased with walking  Radiographic Exam:  Normal osseous mineralization.  Degenerative disc changes noted to the first and second MTPJ of the right foot.  Periarticular spurring also noted to the right great toe joint. Orthopedic hardware to the left foot appear stable and intact.  Medial slide calcaneal osteotomy with orthopedic plate to the calcaneus.  There is also tendon anchor noted to the medial aspect of the navicular.  Appears stable and intact.  Assessment: 1.  Hallux limitus/capsulitis right 2.  DJD second MTPJ  right 3.  H/0 medial slide calcaneal osteotomy left.  Kidner procedure left.  DOS: 2017 in [place]   Plan of Care:  1. Patient evaluated. X-Rays reviewed.  2.  Injection of 0.5 cc Celestone Soluspan injected into the first and second MTPJ of the right foot 3.  Recommend OTC arch supports to help support the arch of the left foot and alleviate some of the patient's pain 4.  Prescription for meloxicam 15 mg daily 5.  Return to clinic in 4 weeks  *Son's name is Reuel Boom.  From [place].      Felecia Shelling, DPM [facility]  Dr. Felecia Shelling, DPM    [address] [place], [place] [address]                Office [phone]  Fax [phone]

## 2020-06-27 ENCOUNTER — Ambulatory Visit: Payer: Self-pay | Admitting: Otolaryngology

## 2020-07-04 ENCOUNTER — Ambulatory Visit: Payer: Self-pay | Admitting: Otolaryngology

## 2020-07-16 MED FILL — LOSARTAN POTASSIUM 100 MG T: 100 | 30 days supply | Qty: 30 | Fill #0

## 2020-07-18 ENCOUNTER — Other Ambulatory Visit: Payer: Self-pay

## 2020-07-18 ENCOUNTER — Encounter: Payer: Self-pay | Admitting: Otolaryngology

## 2020-07-18 ENCOUNTER — Ambulatory Visit: Payer: Self-pay | Attending: Otolaryngology | Admitting: Otolaryngology

## 2020-07-18 VITALS — BP 137/78 | HR 74 | Resp 16 | Wt 133.8 lb

## 2020-07-18 DIAGNOSIS — H6123 Impacted cerumen, bilateral: Secondary | ICD-10-CM

## 2020-07-18 MED ORDER — CARBAMIDE PEROXIDE 6.5 % OT SOLN: 5.0000 [drp] | Freq: Two times a day (BID) | OTIC | 2 refills | Status: DC

## 2020-07-18 NOTE — Patient Instructions (Signed)
They were able to successfully clear some of the wax from your left ear canal, but the right ear is still blocked up.  I was unable to complete the job today.  We will use Debrox wax removal drops twice daily for the next month, and then see you back.

## 2020-07-18 NOTE — Progress Notes (Signed)
Chief complaint: Hearing difficulty  History: 70 year old black female has had some issues with wax and hearing loss off and on for years.  Her recently, the left ear was irrigated Improvement in the hearing.  They did not choose to do the right ear at the same time.  No pain or drainage.  She does use Q-tips.  Examination: I Conducted the interview using a video interpreter.  The right ear canal is blocked with soft wax.  The left ear canal is not completely blocked.  Anterior nose is clear.  Oral cavity is clear.  Oropharynx is clear.  Neck unremarkable.  Bilateral cerumen impactions with hearing difficulty.  Plan: I gave her a prescription for Debrox to soften the wax.  I will see her back in 1 month.

## 2020-07-21 ENCOUNTER — Other Ambulatory Visit: Payer: Self-pay

## 2020-07-21 ENCOUNTER — Ambulatory Visit (INDEPENDENT_AMBULATORY_CARE_PROVIDER_SITE_OTHER): Payer: No Typology Code available for payment source | Admitting: Podiatry

## 2020-07-21 ENCOUNTER — Other Ambulatory Visit: Payer: Self-pay | Admitting: Podiatry

## 2020-07-21 DIAGNOSIS — M7751 Other enthesopathy of right foot: Secondary | ICD-10-CM

## 2020-07-21 DIAGNOSIS — M19071 Primary osteoarthritis, right ankle and foot: Secondary | ICD-10-CM

## 2020-07-21 DIAGNOSIS — M205X1 Other deformities of toe(s) (acquired), right foot: Secondary | ICD-10-CM

## 2020-07-21 MED ORDER — MELOXICAM 15 MG PO TABS: 15.0000 mg | ORAL_TABLET | Freq: Every day | ORAL | 1 refills | Status: DC

## 2020-07-21 MED FILL — MELOXICAM 15 MG TABLET: 15 | 30 days supply | Qty: 30 | Fill #0

## 2020-07-21 NOTE — Progress Notes (Signed)
   HPI: 70 y.o. female presenting today for follow-up evaluation of right foot pain.  Patient has a history of left foot surgery performed in [place] approximately 5 years ago.  She also states that over the past several months she has had increased pain and tenderness to the right great toe.  She states that the surgeon in [place] said she would need to have surgery to the right great toe approximately 2 years ago.  Today the patient states she has minimal improvement.  No new complaints at this time.  She has not been taking the oral meloxicam as prescribed.  She is unsure if she even picked it up from the pharmacy  Past Medical History:  Diagnosis Date  . Arthritis   . Hypertension      Physical Exam: General: The patient is alert and oriented x3 in no acute distress.  Dermatology: Skin is warm, dry and supple bilateral lower extremities. Negative for open lesions or macerations.  Vascular: Palpable pedal pulses bilaterally. No edema or erythema noted. Capillary refill within normal limits.  Neurological: Epicritic and protective threshold grossly intact bilaterally.   Musculoskeletal Exam: Range of motion within normal limits to all pedal and ankle joints bilateral. Muscle strength 5/5 in all groups bilateral.  Pain on palpation with limited range of motion to the first MTPJ of the right foot.  There is also pain on palpation of the second MTPJ of the right foot. Diffuse generalized pain with left rear foot.  Patient states the pain is increased with walking  Radiographic Exam:  Normal osseous mineralization.  Degenerative disc changes noted to the first and second MTPJ of the right foot.  Periarticular spurring also noted to the right great toe joint. Orthopedic hardware to the left foot appear stable and intact.  Medial slide calcaneal osteotomy with orthopedic plate to the calcaneus.  There is also tendon anchor noted to the medial aspect of the navicular.  Appears stable and  intact.  Assessment: 1.  Hallux limitus/capsulitis right 2.  DJD second MTPJ right 3.  H/0 medial slide calcaneal osteotomy left.  Kidner procedure left.  DOS: 2017 in [place]   Plan of Care:  1. Patient evaluated.  2.  Patient declined injection today  3.  Patient has not gotten OTC arch supports and presents today wearing a pair of flats.  Recommend OTC arch supports to help support the arch of the left foot and alleviate some of the patient's pain as well as good supportive shoes with minimal band in the toebox area 4.  Prescription for meloxicam 15 mg daily resent to the pharmacy.  Patient states that she did not take the prescription 5.  Return to clinic in 8 weeks.  If there is no improvement we will need to discuss possible surgery to address the first and second MTPJ  *Son's name is Reuel Boom and Jari Favre (47yrs).  From [place].      Felecia Shelling, DPM [facility]  Dr. Felecia Shelling, DPM    [address] [place], [place] [address]                Office [phone]  Fax [phone]

## 2020-08-06 ENCOUNTER — Encounter: Payer: Self-pay | Admitting: Gastroenterology

## 2020-08-14 MED FILL — LOSARTAN POTASSIUM 100 MG T: 100 | 60 days supply | Qty: 60 | Fill #1

## 2020-08-15 ENCOUNTER — Ambulatory Visit: Payer: Self-pay | Admitting: Otolaryngology

## 2020-08-18 ENCOUNTER — Ambulatory Visit: Payer: Self-pay | Admitting: Critical Care Medicine

## 2020-09-01 ENCOUNTER — Ambulatory Visit: Payer: Self-pay

## 2020-09-02 ENCOUNTER — Ambulatory Visit: Payer: Self-pay | Attending: Family Medicine

## 2020-09-02 ENCOUNTER — Other Ambulatory Visit: Payer: Self-pay

## 2020-09-10 ENCOUNTER — Ambulatory Visit (AMBULATORY_SURGERY_CENTER): Payer: Self-pay | Admitting: *Deleted

## 2020-09-10 ENCOUNTER — Other Ambulatory Visit: Payer: Self-pay

## 2020-09-10 VITALS — Ht 59.0 in | Wt 134.2 lb

## 2020-09-10 DIAGNOSIS — Z1211 Encounter for screening for malignant neoplasm of colon: Secondary | ICD-10-CM

## 2020-09-10 NOTE — Progress Notes (Signed)
Interpreter used today at the [facility] for this pt.  Interpreter's name is- Olegario Messier.  Son also present   PT has PONV, denies being told they were difficult to intubate, or hx/fam hx of malignant hyperthermia per pt   No egg or soy allergy  No home oxygen use   No medications for weight loss taken  emmi information given  Pt denies constipation issues

## 2020-09-18 ENCOUNTER — Ambulatory Visit: Payer: Self-pay | Admitting: Critical Care Medicine

## 2020-09-22 ENCOUNTER — Other Ambulatory Visit: Payer: Self-pay

## 2020-09-22 ENCOUNTER — Ambulatory Visit (INDEPENDENT_AMBULATORY_CARE_PROVIDER_SITE_OTHER): Payer: No Typology Code available for payment source | Admitting: Podiatry

## 2020-09-22 DIAGNOSIS — M79671 Pain in right foot: Secondary | ICD-10-CM

## 2020-09-22 DIAGNOSIS — M205X1 Other deformities of toe(s) (acquired), right foot: Secondary | ICD-10-CM

## 2020-09-22 DIAGNOSIS — M7751 Other enthesopathy of right foot: Secondary | ICD-10-CM

## 2020-09-22 DIAGNOSIS — M19071 Primary osteoarthritis, right ankle and foot: Secondary | ICD-10-CM

## 2020-09-22 NOTE — Progress Notes (Signed)
   HPI: 70 y.o. female presenting today for follow-up evaluation of right foot pain.  Patient has a history of left foot surgery performed in [place] approximately 5 years ago.  She also states that over the past several months she has had increased pain and tenderness to the right great toe.  She states that the surgeon in [place] said she would need to have surgery to the right great toe approximately 2 years ago.  Patient states that the meloxicam helps somewhat with her pain.  She states when she walks she has no pain however she does experience pain with any closed toed shoes that compress the forefoot.  No new complaints at this time  Past Medical History:  Diagnosis Date  . Arthritis   . Asthma    hx of  . Heart murmur    unsure of this, but was told she had one by MD in [place]  . Hypertension      Physical Exam: General: The patient is alert and oriented x3 in no acute distress.  Dermatology: Skin is warm, dry and supple bilateral lower extremities. Negative for open lesions or macerations.  Vascular: Palpable pedal pulses bilaterally. No edema or erythema noted. Capillary refill within normal limits.  Neurological: Epicritic and protective threshold grossly intact bilaterally.   Musculoskeletal Exam: Range of motion within normal limits to all pedal and ankle joints bilateral. Muscle strength 5/5 in all groups bilateral.  Pain on palpation with limited range of motion to the first MTPJ of the right foot.  There is also pain on palpation of the second MTPJ of the right foot. Diffuse generalized pain with left rear foot.  Patient states the pain is increased with walking  Radiographic Exam taken last visit:  Normal osseous mineralization.  Degenerative disc changes noted to the first and second MTPJ of the right foot.  Periarticular spurring also noted to the right great toe joint. Orthopedic hardware to the left foot appear stable and intact.  Medial slide calcaneal osteotomy  with orthopedic plate to the calcaneus.  There is also tendon anchor noted to the medial aspect of the navicular.  Appears stable and intact.  Assessment: 1.  Hallux limitus/capsulitis right 2.  DJD second MTPJ right 3.  H/0 medial slide calcaneal osteotomy left.  Kidner procedure left.  DOS: 2017 in [place]   Plan of Care:  1. Patient evaluated.  2.  Patient declined injection today  3.  Continue meloxicam 15 mg daily as needed 4.  Recommend OTC Voltaren 1% topical gel 5.  Recommend good supportive shoes 6.  Return to clinic as needed, if the patient needs an injection or is ready for surgery.  Currently she states she is not ready for any surgery  *Son's name is Reuel Boom and Jari Favre (62yrs).  From [place].  Patient has a daughter coming to visit in August 2022.      Felecia Shelling, DPM [facility]  Dr. Felecia Shelling, DPM    [address] [place], [place] [address]                Office [phone]  Fax [phone]

## 2020-09-23 ENCOUNTER — Emergency Department
Admission: EM | Admit: 2020-09-23 | Discharge: 2020-09-23 | Disposition: A | Discharge status: Home / Self Care | Payer: Self-pay | Attending: Emergency Medicine | Admitting: Emergency Medicine

## 2020-09-23 DIAGNOSIS — Z79899 Other long term (current) drug therapy: Secondary | ICD-10-CM | POA: Diagnosis not present

## 2020-09-23 DIAGNOSIS — R202 Paresthesia of skin: Secondary | ICD-10-CM | POA: Diagnosis not present

## 2020-09-23 DIAGNOSIS — R42 Dizziness and giddiness: Secondary | ICD-10-CM | POA: Diagnosis not present

## 2020-09-23 DIAGNOSIS — J45909 Unspecified asthma, uncomplicated: Secondary | ICD-10-CM | POA: Diagnosis not present

## 2020-09-23 DIAGNOSIS — R110 Nausea: Secondary | ICD-10-CM | POA: Insufficient documentation

## 2020-09-23 DIAGNOSIS — I10 Essential (primary) hypertension: Secondary | ICD-10-CM | POA: Insufficient documentation

## 2020-09-23 DIAGNOSIS — R197 Diarrhea, unspecified: Secondary | ICD-10-CM | POA: Insufficient documentation

## 2020-09-23 DIAGNOSIS — R109 Unspecified abdominal pain: Secondary | ICD-10-CM | POA: Insufficient documentation

## 2020-09-23 LAB — BASIC METABOLIC PANEL
Anion gap: 8 (ref 5–15)
BUN: 10 mg/dL (ref 8–23)
CO2: 24 mmol/L (ref 22–32)
Calcium: 9.4 mg/dL (ref 8.9–10.3)
Chloride: 106 mmol/L (ref 98–111)
Creatinine, Ser: 0.37 mg/dL — ABNORMAL LOW (ref 0.44–1.00)
GFR, Estimated: >60 mL/min (ref >60)
Glucose, Bld: 101 mg/dL — ABNORMAL HIGH (ref 70–99)
Potassium: 3.6 mmol/L (ref 3.5–5.1)
Sodium: 138 mmol/L (ref 135–145)

## 2020-09-23 LAB — CBC WITH DIFFERENTIAL/PLATELET
Abs Immature Granulocytes: 0.05 10*3/uL (ref 0.00–0.07)
Basophils Absolute: 0.1 10*3/uL (ref 0.0–0.1)
Basophils Relative: 1 %
Eosinophils Absolute: 0.5 10*3/uL (ref 0.0–0.5)
Eosinophils Relative: 5 %
HCT: 39.6 % (ref 36.0–46.0)
Hemoglobin: 13.8 g/dL (ref 12.0–15.0)
Immature Granulocytes: 1 %
Lymphocytes Relative: 21 %
Lymphs Abs: 1.9 10*3/uL (ref 0.7–4.0)
MCH: 36.5 pg — ABNORMAL HIGH (ref 26.0–34.0)
MCHC: 34.8 g/dL (ref 30.0–36.0)
MCV: 104.8 fL — ABNORMAL HIGH (ref 80.0–100.0)
Monocytes Absolute: 0.5 10*3/uL (ref 0.1–1.0)
Monocytes Relative: 5 %
Neutro Abs: 6.1 10*3/uL (ref 1.7–7.7)
Neutrophils Relative %: 67 %
Platelets: 268 10*3/uL (ref 150–400)
RBC: 3.78 MIL/uL — ABNORMAL LOW (ref 3.87–5.11)
RDW: 13.7 % (ref 11.5–15.5)
WBC: 9.1 10*3/uL (ref 4.0–10.5)
nRBC: 0 % (ref 0.0–0.2)

## 2020-09-23 LAB — MAGNESIUM: Magnesium: 2.1 mg/dL (ref 1.7–2.4)

## 2020-09-23 MED ORDER — ONDANSETRON HCL 4 MG/2ML IJ SOLN
4.0000 mg | Freq: Once | INTRAMUSCULAR | Status: AC
  Administered 2020-09-23: 4 mg via INTRAVENOUS
  Filled 2020-09-23: qty 2

## 2020-09-23 MED ORDER — LACTATED RINGERS IV BOLUS
1000.0000 mL | Freq: Once | INTRAVENOUS | Status: AC
  Administered 2020-09-23: 1000 mL via INTRAVENOUS

## 2020-09-23 NOTE — Discharge Instructions (Addendum)
Please go through with your GoLytely prep this evening at home.   You have made it this far and I would urge you to have the colonoscopy done tomorrow.  If you pass out all the way, have bloody diarrhea or fevers with your symptoms, please return to the ED.

## 2020-09-23 NOTE — ED Triage Notes (Signed)
Patient BIBA from home. Patient reports she is scheduled for colonoscopy tomorrow, currently drinking bowel prep. Patient reports abdominal cramping and feeling faint. Denies syncope. Negative orthostatics with EMS. FSBS 130. Patient A&Ox3.

## 2020-09-23 NOTE — ED Provider Notes (Signed)
[facility] Emergency Department Provider Note ____________________________________________   Event Date/Time   First MD Initiated Contact with Patient 09/23/20 1710     (approximate)  I have reviewed the triage vital signs and the nursing notes.  HISTORY  Chief Complaint Abdominal Pain and Dizziness   HPI Claudia Hernandez Matar is a 70 y.o. femalewho presents to the ED for evaluation of abdominal cramping and presyncopal dizziness.  Chart review indicates minimal medical history.  HTN and HLD.  Patient reports a routine colonoscopy that is scheduled for tomorrow as an outpatient.  She reports not having solid food since 8 PM last night.  She reports taking x4 Dulcolax this afternoon at 3 PM, then an hour later developing diffuse abdominal cramping and loose stools recurrently.  Denies hematochezia or melena.  She reports associated nausea without emesis.  She reports tingling sensation throughout her body and concern for dehydration.  She reports fear of taking her GoLytely prep later this evening.  She denies any recent fevers, dysuria or hematuria, emesis, syncopal episodes or recent illnesses prior to this event.  In person interpreter utilized for history and physical acquisition.  I urged the patient to go through with the GoLytely prep and colonoscopy tomorrow, because she has already made it to this point.  We discussed checking electrolytes and rehydration before likely outpatient management.   Past Medical History:  Diagnosis Date  . Arthritis   . Asthma    hx of  . Heart murmur    unsure of this, but was told she had one by MD in [place]  . Hypertension     Patient Active Problem List   Diagnosis Date Noted  . Elevated LDL cholesterol level 06/17/2020  . Bilateral hearing loss due to cerumen impaction 06/16/2020  . Toe pain, right 05/13/2020  . Yeast infection of the skin 05/13/2020  . Essential hypertension 05/13/2020    Past  Surgical History:  Procedure Laterality Date  . BLADDER SURGERY    . CESAREAN SECTION    . CHOLECYSTECTOMY    . gallbladder removed    . left foot surgery      Prior to Admission medications   Medication Sig Start Date End Date Taking? Authorizing Provider  ACETAMINOPHEN PO Take by mouth. PRN    [provider]  atorvastatin (LIPITOR) 10 MG tablet TAKE 1 TABLET (10 MG TOTAL) BY MOUTH DAILY. Patient not taking: Reported on 09/10/2020 06/17/20 06/17/21  Mayers, Kasandra Knudsen, PA-C  carbamide peroxide (DEBROX) 6.5 % OTIC solution Place 5 drops into the right ear 2 (two) times daily. Patient not taking: Reported on 09/10/2020 07/18/20 07/18/21  Flo Shanks, MD  COLLAGEN PO Take by mouth.    [provider]  losartan (COZAAR) 100 MG tablet TAKE 1 TABLET (100 MG TOTAL) BY MOUTH DAILY. 06/16/20 06/16/21  Storm Frisk, MD  meloxicam (MOBIC) 15 MG tablet TAKE 1 TABLET (15 MG TOTAL) BY MOUTH DAILY. Patient not taking: Reported on 09/10/2020 07/21/20 07/21/21  Felecia Shelling, DPM  nystatin-triamcinolone ointment (MYCOLOG) APPLY 1 APPLICATION TOPICALLY 2 (TWO) TIMES DAILY. 06/16/20 06/16/21  Storm Frisk, MD    Allergies Patient has no known allergies.  Family History  Problem Relation Age of Onset  . Drug abuse Neg Hx   . Hypertension Neg Hx   . Colon cancer Neg Hx   . Esophageal cancer Neg Hx   . Rectal cancer Neg Hx   . Stomach cancer Neg Hx  Social History Social History   Tobacco Use  . Smoking status: Never Smoker  . Smokeless tobacco: Never Used  Vaping Use  . Vaping Use: Never used  Substance Use Topics  . Alcohol use: Never  . Drug use: Never    Review of Systems  Constitutional: No fever/chills.  Positive for presyncopal dizziness Eyes: No visual changes. ENT: No sore throat. Cardiovascular: Denies chest pain. Respiratory: Denies shortness of breath. Gastrointestinal:   No nausea, no vomiting.  No constipation. Positive for diffuse abdominal cramping  and loose stool Genitourinary: Negative for dysuria. Musculoskeletal: Negative for back pain. Skin: Negative for rash. Neurological: Negative for headaches, focal weakness or numbness.  ____________________________________________   PHYSICAL EXAM:  VITAL SIGNS: Vitals:   09/23/20 1706 09/23/20 1708  BP: 122/65   Pulse:    Resp:    Temp:  98.5 F (36.9 C)  SpO2:       Constitutional: Alert and oriented. Well appearing and in no acute distress. Eyes: Conjunctivae are normal. PERRL. EOMI. Head: Atraumatic. Nose: No congestion/rhinnorhea. Mouth/Throat: Mucous membranes are moist.  Oropharynx non-erythematous. Neck: No stridor. No cervical spine tenderness to palpation. Cardiovascular: Normal rate, regular rhythm. Grossly normal heart sounds.  Good peripheral circulation. Respiratory: Normal respiratory effort.  No retractions. Lungs CTAB. Gastrointestinal: Soft , nondistended, nontender to palpation. No CVA tenderness. Musculoskeletal: No lower extremity tenderness nor edema.  No joint effusions. No signs of acute trauma. Neurologic:  Normal speech and language. No gross focal neurologic deficits are appreciated. No gait instability noted. Skin:  Skin is warm, dry and intact. No rash noted. Psychiatric: Mood and affect are normal. Speech and behavior are normal.  ____________________________________________   LABS (all labs ordered are listed, but only abnormal results are displayed)  Labs Reviewed  CBC WITH DIFFERENTIAL/PLATELET - Abnormal; Notable for the following components:      Result Value   RBC 3.78 (*)    MCV 104.8 (*)    MCH 36.5 (*)    All other components within normal limits  BASIC METABOLIC PANEL - Abnormal; Notable for the following components:   Glucose, Bld 101 (*)    Creatinine, Ser 0.37 (*)    All other components within normal limits  MAGNESIUM   ____________________________________________  12 Lead EKG  Sinus rhythm, rate of 70 bpm.  Normal  axis and intervals.  No evidence of acute ischemia. ____________________________________________   PROCEDURES and INTERVENTIONS  Procedure(s) performed (including Critical Care):  .1-3 Lead EKG Interpretation Performed by: Delton Prairie, MD Authorized by: Delton Prairie, MD     Interpretation: normal     ECG rate:  70   ECG rate assessment: normal     Rhythm: sinus rhythm     Ectopy: none     Conduction: normal      Medications  lactated ringers bolus 1,000 mL (1,000 mLs Intravenous New Bag/Given 09/23/20 1729)  ondansetron (ZOFRAN) injection 4 mg (4 mg Intravenous Given 09/23/20 1729)    ____________________________________________   MDM / ED COURSE   70 year old woman presents to the ED with diffuse abdominal cramping, nausea and loose stools in the setting of colonoscopy prep, without evidence of acute derangements, and amenable to outpatient management.  Normal vitals on room air.  Exam reassuring without abdominal tenderness to palpation to necessitate imaging.  Benign exam without evidence of dehydration.  Normal electrolytes on blood work.  Provided IV rehydration and antiemetics with improvement of her symptoms.  We discussed return precautions for the ED and patient stable for  outpatient management.     ____________________________________________   FINAL CLINICAL IMPRESSION(S) / ED DIAGNOSES  Final diagnoses:  Dizziness  Nausea     ED Discharge Orders    None       Asael Pann Katrinka Blazing   Note:  This document was prepared using Dragon voice recognition software and may include unintentional dictation errors.   Delton Prairie, MD 09/23/20 1755

## 2020-09-24 ENCOUNTER — Encounter: Payer: Self-pay | Admitting: Gastroenterology

## 2020-09-24 ENCOUNTER — Ambulatory Visit (AMBULATORY_SURGERY_CENTER): Payer: PRIVATE HEALTH INSURANCE | Admitting: Gastroenterology

## 2020-09-24 ENCOUNTER — Ambulatory Visit: Payer: Self-pay | Admitting: Family Medicine

## 2020-09-24 ENCOUNTER — Other Ambulatory Visit: Payer: Self-pay

## 2020-09-24 VITALS — BP 112/62 | HR 62 | Temp 97.8°F | Resp 14 | Ht 59.0 in | Wt 134.0 lb

## 2020-09-24 DIAGNOSIS — Z1211 Encounter for screening for malignant neoplasm of colon: Secondary | ICD-10-CM | POA: Diagnosis present

## 2020-09-24 MED ORDER — SODIUM CHLORIDE 0.9 % IV SOLN: 500.0000 mL | Freq: Once | INTRAVENOUS | Status: DC

## 2020-09-24 NOTE — Progress Notes (Signed)
J.D. vital signs. 

## 2020-09-24 NOTE — Progress Notes (Signed)
Pt's states no medical or surgical changes since previsit or office visit. 

## 2020-09-24 NOTE — Progress Notes (Signed)
Pt Drowsy. VSS. To PACU, report to RN. No anesthetic complications noted.  

## 2020-09-24 NOTE — Patient Instructions (Signed)
coloscopia 10 anos  Handout given for hemorrhoids.   USTED TUVO UN PROCEDIMIENTO ENDOSCPICO HOY EN [facility]:   Lea el informe del procedimiento que se le entreg para cualquier pregunta especfica sobre lo que se Dentist.  Si el informe del examen no responde a sus preguntas, por favor llame a su gastroenterlogo para aclararlo.  Si usted solicit que no se le den [employer] de lo que se Clinical cytogeneticist en su procedimiento al [employer] va a cuidar, entonces el informe del procedimiento se ha incluido en un sobre sellado para que usted lo revise despus cuando le sea ms conveniente.   LO QUE PUEDE ESPERAR: Algunas sensaciones de hinchazn en el abdomen.  Puede tener ms gases de lo normal.  El caminar puede ayudarle a eliminar el aire que se le puso en el tracto gastrointestinal durante el procedimiento y reducir la hinchazn.  Si le hicieron una endoscopia inferior (como una colonoscopia o una sigmoidoscopia flexible), podra notar manchas de sangre en las heces fecales o en el papel higinico.  Si se someti a una preparacin intestinal para su procedimiento, es posible que no tenga una evacuacin intestinal normal durante [employer].   Tenga en cuenta:  Es posible que note un poco de irritacin y congestin en la nariz o algn drenaje.  Esto es debido al oxgeno [employer] durante su procedimiento.  No hay que preocuparse y esto debe desaparecer ms o Regulatory affairs officer.   SNTOMAS PARA REPORTAR INMEDIATAMENTE:  Despus de una endoscopia inferior (colonoscopia):  Cantidades excesivas de sangre en las heces fecales  Sensibilidad significativa o empeoramiento de los dolores abdominales   Hinchazn aguda del abdomen que antes no tena   Fiebre de 100F o ms   Para asuntos urgentes o de Associate Professor, puede comunicarse con un gastroenterlogo a cualquier hora llamando al [phone].  DIETA:  Recomendamos una comida pequea al principio, pero luego puede  continuar con su dieta normal.  Tome muchos lquidos, Tax adviser las bebidas alcohlicas durante 24 horas.    ACTIVIDAD:  Debe planear tomarse las cosas con calma por el resto del da y no debe CONDUCIR ni usar maquinaria pesada Patent examiner (debido a los medicamentos de sedacin utilizados durante el examen).     SEGUIMIENTO: Nuestro personal llamar al nmero que aparece en su historial al siguiente da hbil de su procedimiento para ver cmo se siente y para responder cualquier pregunta o inquietud que pueda tener con respecto a la informacin que se le dio despus del procedimiento. Si no podemos contactarle, le dejaremos un mensaje.  Sin embargo, si se siente bien y no tiene English as a second language teacher, no es necesario que nos devuelva la llamada.  Asumiremos que ha regresado a sus actividades diarias normales sin incidentes. Si se le tomaron algunas biopsias, le contactaremos por telfono o por carta en las prximas 3 semanas.  Si no ha sabido [employer] biopsias en el transcurso de 3 semanas, por favor llmenos al [phone].   FIRMAS/CONFIDENCIALIDAD: Usted y/o el acompaante que le cuide han firmado documentos que se ingresarn en su historial mdico electrnico.  Estas firmas atestiguan el hecho de que la informacin anterior

## 2020-09-24 NOTE — Op Note (Signed)
[facility] Patient Name: Claudia Hernandez Procedure Date: 09/24/2020 10:22 AM MRN: [id number] Endoscopist: Rachael Fee , MD Age: 70 Referring MD:  Date of Birth: 1951-02-07 Gender: Female Account #: [id number] Procedure:                Colonoscopy Indications:              Screening for colorectal malignant neoplasm Medicines:                Monitored Anesthesia Care Procedure:                Pre-Anesthesia Assessment:                           - Prior to the procedure, a History and Physical                            was performed, and patient medications and                            allergies were reviewed. The patient's tolerance of                            previous anesthesia was also reviewed. The risks                            and benefits of the procedure and the sedation                            options and risks were discussed with the patient.                            All questions were answered, and informed consent                            was obtained. Prior Anticoagulants: The patient has                            taken no previous anticoagulant or antiplatelet                            agents. ASA Grade Assessment: II - A patient with                            mild systemic disease. After reviewing the risks                            and benefits, the patient was deemed in                            satisfactory condition to undergo the procedure.                           After obtaining informed consent, the colonoscope  was passed under direct vision. Throughout the                            procedure, the patient's blood pressure, pulse, and                            oxygen saturations were monitored continuously. The                            Olympus [id number]) [id number] was introduced                            through the anus and advanced to the the cecum,                            identified  by appendiceal orifice and ileocecal                            valve. The colonoscopy was performed without                            difficulty. The patient tolerated the procedure                            well. The quality of the bowel preparation was                            good. The ileocecal valve, appendiceal orifice, and                            rectum were photographed. Scope In: 10:54:33 AM Scope Out: 11:04:34 AM Scope Withdrawal Time: 0 hours 6 minutes 57 seconds  Total Procedure Duration: 0 hours 10 minutes 1 second  Findings:                 Small internal hemorrhoids were found.                           The exam was otherwise without abnormality on                            direct and retroflexion views. Complications:            No immediate complications. Estimated blood loss:                            None. Estimated Blood Loss:     Estimated blood loss: none. Impression:               - Internal hemorrhoids.                           - The examination was otherwise normal on direct                            and retroflexion views.                           -  No polyps or cancers. Recommendation:           - Patient has a contact number available for                            emergencies. The signs and symptoms of potential                            delayed complications were discussed with the                            patient. Return to normal activities tomorrow.                            Written discharge instructions were provided to the                            patient.                           - Resume previous diet.                           - Continue present medications.                           - Repeat colonoscopy in 10 years for screening. Rachael Fee, MD 09/24/2020 11:11:44 AM This report has been signed electronically.

## 2020-09-26 ENCOUNTER — Telehealth: Payer: Self-pay

## 2020-09-26 NOTE — Telephone Encounter (Signed)
  Follow up Call-  Call back number 09/24/2020  Post procedure Call Back phone  # Daniel-call [phone]  Permission to leave phone message Yes     Patient questions:  Do you have a fever, pain , or abdominal swelling? No. Pain Score  0 *  Have you tolerated food without any problems? Yes.    Have you been able to return to your normal activities? Yes.    Do you have any questions about your discharge instructions: Diet   No. Medications  No. Follow up visit  No.  Do you have questions or concerns about your Care? No.  Actions: * If pain score is 4 or above: No action needed, pain <4.  1. Have you developed a fever since your procedure? no  2.   Have you had an respiratory symptoms (SOB or cough) since your procedure? no 3.   Have you tested positive for COVID 19 since your procedure no  4.   Have you had any family members/close contacts diagnosed with the COVID 19 since your procedure?  no  If yes to any of these questions please route to Laverna Peace, RN and Karlton Lemon, RN

## 2020-10-03 ENCOUNTER — Ambulatory Visit: Payer: Self-pay | Admitting: Otolaryngology

## 2020-10-10 ENCOUNTER — Other Ambulatory Visit: Payer: Self-pay

## 2020-10-10 ENCOUNTER — Ambulatory Visit
Admission: RE | Admit: 2020-10-10 | Discharge: 2020-10-10 | Disposition: A | Discharge status: Home / Self Care | Payer: PRIVATE HEALTH INSURANCE | Source: Ambulatory Visit | Attending: Critical Care Medicine | Admitting: Critical Care Medicine

## 2020-10-10 DIAGNOSIS — Z780 Asymptomatic menopausal state: Secondary | ICD-10-CM

## 2020-10-13 ENCOUNTER — Telehealth: Payer: Self-pay

## 2020-10-13 NOTE — Telephone Encounter (Signed)
Attempted to call pt no answer lvm to return call.  

## 2020-10-13 NOTE — Telephone Encounter (Signed)
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   Should repeat test in two years. 

## 2020-10-14 NOTE — Telephone Encounter (Signed)
Attempted to call pt no answer lvm again to call back.

## 2020-10-14 NOTE — Telephone Encounter (Signed)
Called pt again no answer lvm to return call. Also tried daughter's number as well no answer.

## 2020-10-14 NOTE — Telephone Encounter (Signed)
-----   Message from Storm Frisk, MD sent at 10/13/2020  1:09 PM EDT ----- Let pt know bone dexa scan shows osteopenia which is mild  not severe osteoporosis,   I recommend she take Calcium  600mg  daily,  vitamin D 2000 units daily.   Should repeat test in two years.

## 2020-10-14 NOTE — Telephone Encounter (Signed)
Pt called back using interpreter pt informed of lab results and to take Calcium 600 mg and Vitamin D 2000 units daily. Pt verbalized understanding and voiced no other concerns.

## 2020-10-14 NOTE — Telephone Encounter (Signed)
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   Should repeat test in two years. 

## 2020-10-16 ENCOUNTER — Other Ambulatory Visit: Payer: Self-pay | Admitting: Critical Care Medicine

## 2020-10-16 DIAGNOSIS — I10 Essential (primary) hypertension: Secondary | ICD-10-CM

## 2020-10-16 MED ORDER — LOSARTAN POTASSIUM 100 MG PO TABS: ORAL_TABLET | Freq: Every day | ORAL | 0 refills | Status: DC

## 2020-10-16 NOTE — Telephone Encounter (Signed)
Appointment 11/06/20- RF granted

## 2020-10-16 NOTE — Telephone Encounter (Signed)
Copied from CRM [id number]. Topic: Quick Communication - Rx Refill/Question >> Oct 16, 2020  4:09 PM Jaquita Rector A wrote: Medication: losartan (COZAAR) 100 MG tablet   Has the patient contacted their pharmacy? Yes.   (Agent: If no, request that the patient contact the pharmacy for the refill.) (Agent: If yes, when and what did the pharmacy advise?)  Preferred Pharmacy (with phone number or street name): [facility] [address] [place] - [address]  Phone:  [phone] Fax:  [phone]     Agent: Please be advised that RX refills may take up to 3 business days. We ask that you follow-up with your pharmacy.

## 2020-11-06 ENCOUNTER — Ambulatory Visit: Payer: Self-pay | Admitting: Critical Care Medicine

## 2020-11-06 NOTE — Progress Notes (Deleted)
Subjective:    Patient ID: Claudia Hernandez, female    DOB: [birth year], 70 y.o.   MRN: [id number]  69 y.o.F here to est PCP  Former [facility] patient  06/16/2020 This is a primary care patient of Dr. Chapman Fitch who is here today to establish for primary care.  There is an in person interpreter Jamas Lav at this visit for [place]  Patient's last visit was in December when she was seen by physician assistant Mayers for right toe pain.  The patient was given a prescription for meloxicam but she did not fill this medication.  She also received topical cream for a yeast infection of the back which did help to some degree but is still persisting.  The patient's been monitored for hypertension and on arrival is 132/82  The patient states her right foot continues to hurt.  It is primarily in the second toe next to the right great toe.  It is swollen.  She denies any other arthritic changes throughout her note the patient had screening labs already obtained last week body.  Note she is status postcholecystectomy and had previous left surgery of the foot in [place] in the past.  On review of systems the patient has no other specific complaints except decreased hearing.  She has no prior history of diabetes.  The patient is on losartan 50 mg twice daily for hypertension she sometimes forgets to take the second dose of the day.  The patient has a long list of primary care screening gaps.  She does need a colonoscopy, DEXA scan, mammogram.  She is due a pneumonia vaccine and has already received her flu vaccine also she is fully vaccinated for COVID including her booster  11/06/2020  Essential hypertension Blood pressure under good control but improved compliance will change losartan to 100 mg daily and send refills to our pharmacy  Yeast infection of the skin Recurrent yeast infection to the skin of the upper back  Will give topical triamcinolone and antifungal cream  Toe pain, right Recurrent toe  pain of the right foot we will plan referral to podiatry  Bilateral hearing loss due to cerumen impaction Right greater than left cerumen impaction with bilateral hearing loss  We were unable to remove the wax today because as it is severely impacted  Will refer to ENT volunteer physician Dr. Wallene Dales was seen today for hearing problem and medication refill.  Diagnoses and all orders for this visit:  Encounter for health-related screening -     DG Bone Density; Future  Essential hypertension -     losartan (COZAAR) 100 MG tablet; Take 1 tablet (100 mg total) by mouth daily.  Yeast infection of the skin -     nystatin-triamcinolone ointment (MYCOLOG); Apply 1 application topically 2 (two) times daily.  Colon cancer screening -     Ambulatory referral to Gastroenterology  Right foot pain -     Ambulatory referral to Podiatry  Encounter for screening mammogram for malignant neoplasm of breast -     MM DIGITAL SCREENING BILATERAL; Future  Toe pain, right  Bilateral hearing loss due to cerumen impaction   Note HIV and hepatitis C screens were negative  Prevnar vaccine and Tdap given at this visit  Patient is already had her flu vaccine and is triple vaccinated for [place]  Patient agrees to colonoscopy will refer to gastroenterology for colonoscopy  Will schedule mammogram and DEXA scan  Note the patient has the orange and  blue card and full [place] discount  Will refer to podiatry for the right foot pain    Past Medical History:  Diagnosis Date  . Arthritis   . Asthma    hx of  . Heart murmur    unsure of this, but was told she had one by MD in [place]  . Hypertension      Family History  Problem Relation Age of Onset  . Drug abuse Neg Hx   . Hypertension Neg Hx   . Colon cancer Neg Hx   . Esophageal cancer Neg Hx   . Rectal cancer Neg Hx   . Stomach cancer Neg Hx      Social History   Socioeconomic History  . Marital status: Single     Spouse name: Not on file  . Number of children: Not on file  . Years of education: Not on file  . Highest education level: Not on file  Occupational History  . Not on file  Tobacco Use  . Smoking status: Never Smoker  . Smokeless tobacco: Never Used  Vaping Use  . Vaping Use: Never used  Substance and Sexual Activity  . Alcohol use: Never  . Drug use: Never  . Sexual activity: Not Currently  Other Topics Concern  . Not on file  Social History Narrative  . Not on file   Social Determinants of Health   Financial Resource Strain: Not on file  Food Insecurity: Not on file  Transportation Needs: Not on file  Physical Activity: Not on file  Stress: Not on file  Social Connections: Not on file  Intimate Partner Violence: Not on file     No Known Allergies   Outpatient Medications Prior to Visit  Medication Sig Dispense Refill  . ACETAMINOPHEN PO Take by mouth. PRN    . atorvastatin (LIPITOR) 10 MG tablet TAKE 1 TABLET (10 MG TOTAL) BY MOUTH DAILY. 90 tablet 1  . carbamide peroxide (DEBROX) 6.5 % OTIC solution Place 5 drops into the right ear 2 (two) times daily. (Patient not taking: No sig reported) 15 mL 2  . COLLAGEN PO Take by mouth.    . losartan (COZAAR) 100 MG tablet TAKE 1 TABLET (100 MG TOTAL) BY MOUTH DAILY. 90 tablet 0  . meloxicam (MOBIC) 15 MG tablet TAKE 1 TABLET (15 MG TOTAL) BY MOUTH DAILY. 30 tablet 1  . nystatin-triamcinolone ointment (MYCOLOG) APPLY 1 APPLICATION TOPICALLY 2 (TWO) TIMES DAILY. 30 g 0   No facility-administered medications prior to visit.      Review of Systems  Constitutional: Negative.   HENT: Positive for hearing loss.   Eyes: Negative.   Respiratory: Negative.   Cardiovascular: Negative.   Gastrointestinal: Negative.   Endocrine: Negative.   Genitourinary: Negative.   Musculoskeletal:       Right second toe pain  Neurological: Negative.   Hematological: Negative.   Psychiatric/Behavioral: Negative.        Objective:    Physical Exam   There were no vitals filed for this visit.  Gen: Pleasant, well-nourished, in no distress,  normal affect  ENT: No lesions,  mouth clear,  oropharynx clear, no postnasal drip  Neck: No JVD, no TMG, no carotid bruits  Lungs: No use of accessory muscles, no dullness to percussion, clear without rales or rhonchi  Cardiovascular: RRR, heart sounds normal, no murmur or gallops, no peripheral edema  Abdomen: soft and NT, no HSM,  BS normal  Musculoskeletal: No deformities, no cyanosis or clubbing  Neuro: alert, non focal  Skin: Warm, no lesions or rashes BMP Latest Ref Rng & Units 09/23/2020 06/11/2020 04/12/2020  Glucose 70 - 99 mg/dL 101(H) 89 91  BUN 8 - 23 mg/dL 10 7(L) 18  Creatinine 0.44 - 1.00 mg/dL 0.37(L) 0.59 0.60  BUN/Creat Ratio 12 - 28 - 12 -  Sodium 135 - 145 mmol/L 138 140 142  Potassium 3.5 - 5.1 mmol/L 3.6 4.3 3.9  Chloride 98 - 111 mmol/L 106 104 104  CO2 22 - 32 mmol/L 24 - -  Calcium 8.9 - 10.3 mg/dL 9.4 9.3 -   Hepatic Function Latest Ref Rng & Units 06/11/2020  Total Protein 6.0 - 8.5 g/dL 6.8  Albumin 3.8 - 4.8 g/dL 4.3  AST 0 - 40 IU/L 23  Alk Phosphatase 44 - 121 IU/L 117  Total Bilirubin 0.0 - 1.2 mg/dL 0.4   CBC Latest Ref Rng & Units 09/23/2020 06/11/2020 04/12/2020  WBC 4.0 - 10.5 K/uL 9.1 6.3 -  Hemoglobin 12.0 - 15.0 g/dL 13.8 13.8 13.9  Hematocrit 36.0 - 46.0 % 39.6 41.3 41.0  Platelets 150 - 400 K/uL 268 298 -   Lipid Panel     Component Value Date/Time   CHOL 179 06/11/2020 0845   TRIG 128 06/11/2020 0845   HDL 41 06/11/2020 0845   CHOLHDL 4.4 06/11/2020 0845   LDLCALC 115 (H) 06/11/2020 0845   LABVLDL 23 06/11/2020 0845        Assessment & Plan:  I personally reviewed all images and lab data in the [facility] system as well as any outside material available during this office visit and agree with the  radiology impressions.   No problem-specific Assessment & Plan notes found for this encounter.   There are no diagnoses linked  to this encounter. Note HIV and hepatitis C screens were negative  Prevnar vaccine and Tdap given at this visit  Patient is already had her flu vaccine and is triple vaccinated for COVID  Patient agrees to colonoscopy will refer to gastroenterology for colonoscopy  Will schedule mammogram and DEXA scan  Note the patient has the orange and blue card and full [place] discount  Will refer to podiatry for the right foot pain

## 2020-11-09 NOTE — Progress Notes (Signed)
Subjective:    Patient ID: Claudia Hernandez, female    DOB: [birth year], 70 y.o.   MRN: [id number]  69 y.o.F here to est PCP  Former [facility] patient  06/16/2020 This is a primary care patient of Dr. Chapman Fitch who is here today to establish for primary care.  There is an in person interpreter Jamas Lav at this visit for [place]  Patient's last visit was in December when she was seen by physician assistant Mayers for right toe pain.  The patient was given a prescription for meloxicam but she did not fill this medication.  She also received topical cream for a yeast infection of the back which did help to some degree but is still persisting.  The patient's been monitored for hypertension and on arrival is 132/82  The patient states her right foot continues to hurt.  It is primarily in the second toe next to the right great toe.  It is swollen.  She denies any other arthritic changes throughout her note the patient had screening labs already obtained last week body.  Note she is status postcholecystectomy and had previous left surgery of the foot in [place] in the past.  On review of systems the patient has no other specific complaints except decreased hearing.  She has no prior history of diabetes.  The patient is on losartan 50 mg twice daily for hypertension she sometimes forgets to take the second dose of the day.  The patient has a long list of primary care screening gaps.  She does need a colonoscopy, DEXA scan, mammogram.  She is due a pneumonia vaccine and has already received her flu vaccine also she is fully vaccinated for COVID including her booster  11/10/2020 Patient presents today for a primary care follow up appointment. Virtual spanish interpreter Myna Bright [phone] was present during this visit.   The patient has essential hypertension which has been well controlled. Her blood pressure today is 102/67. She states she has been taking losartan 100 mg daily. She denies any negative  side effects.   The patient was started on atorvastatin 10 mg after her last appointment due to her LDL cholesterol being elevated at 115. The patient never started taking the atorvastatin because she thought it was a medication to treat pain.  The patient also states she was unable to schedule a mammogram because she did not have insurance to cover the cost and the breast center would only speak to her in [place].  The patient reports having negative side effects from the colonoscopy prep medication she was prescribed in April of this year before her colonoscopy. The patient reports she had lightheadedness, brief loss of censoriousness, and vomiting after taking 4 tabs of Ducolex. She reports she then went to urgent care where she was given IV fluids. Her symptoms resolved after 2-3 hours and have not recurred. The colonoscopy was negative  The patient also has a history of cerumen impaction for which she has undergone in office cerumen removal in the past. She was prescribed debrox drops which she has been using regularly. She states she has an appointment with the ENT specialist in August and would like to wait to address the cerumen impaction until this visit.   Past Medical History:  Diagnosis Date  . Arthritis   . Asthma    hx of  . Heart murmur    unsure of this, but was told she had one by MD in [place]  . Hypertension  Family History  Problem Relation Age of Onset  . Drug abuse Neg Hx   . Hypertension Neg Hx   . Colon cancer Neg Hx   . Esophageal cancer Neg Hx   . Rectal cancer Neg Hx   . Stomach cancer Neg Hx      Social History   Socioeconomic History  . Marital status: Single    Spouse name: Not on file  . Number of children: Not on file  . Years of education: Not on file  . Highest education level: Not on file  Occupational History  . Not on file  Tobacco Use  . Smoking status: Never Smoker  . Smokeless tobacco: Never Used  Vaping Use  . Vaping Use: Never  used  Substance and Sexual Activity  . Alcohol use: Never  . Drug use: Never  . Sexual activity: Not Currently  Other Topics Concern  . Not on file  Social History Narrative  . Not on file   Social Determinants of Health   Financial Resource Strain: Not on file  Food Insecurity: Not on file  Transportation Needs: Not on file  Physical Activity: Not on file  Stress: Not on file  Social Connections: Not on file  Intimate Partner Violence: Not on file     No Known Allergies   Outpatient Medications Prior to Visit  Medication Sig Dispense Refill  . ACETAMINOPHEN PO Take by mouth. PRN    . COLLAGEN PO Take by mouth.    . losartan (COZAAR) 100 MG tablet TAKE 1 TABLET (100 MG TOTAL) BY MOUTH DAILY. 90 tablet 0  . meloxicam (MOBIC) 15 MG tablet TAKE 1 TABLET (15 MG TOTAL) BY MOUTH DAILY. 30 tablet 1  . carbamide peroxide (DEBROX) 6.5 % OTIC solution Place 5 drops into the right ear 2 (two) times daily. (Patient not taking: No sig reported) 15 mL 2  . nystatin-triamcinolone ointment (MYCOLOG) APPLY 1 APPLICATION TOPICALLY 2 (TWO) TIMES DAILY. (Patient not taking: Reported on 11/10/2020) 30 g 0  . atorvastatin (LIPITOR) 10 MG tablet TAKE 1 TABLET (10 MG TOTAL) BY MOUTH DAILY. (Patient not taking: Reported on 11/10/2020) 90 tablet 1   No facility-administered medications prior to visit.      Review of Systems  Constitutional: Negative.   HENT: Positive for hearing loss. Negative for congestion and rhinorrhea.   Eyes: Negative.   Respiratory: Negative.   Cardiovascular: Negative.   Gastrointestinal: Negative.  Negative for abdominal pain, diarrhea and vomiting.  Endocrine: Negative.   Genitourinary: Negative.   Musculoskeletal:       Right second toe pain  Neurological: Negative.  Negative for dizziness, light-headedness and headaches.  Hematological: Negative.   Psychiatric/Behavioral: Negative.        Objective:   Physical Exam Constitutional:      Appearance: Normal  appearance.  HENT:     Head: Normocephalic and atraumatic.     Right Ear: Tympanic membrane and external ear normal. There is no impacted cerumen.     Left Ear: External ear normal. There is impacted cerumen.  Eyes:     Conjunctiva/sclera: Conjunctivae normal.  Cardiovascular:     Rate and Rhythm: Normal rate and regular rhythm.     Heart sounds: Normal heart sounds. No murmur heard. No friction rub. No gallop.   Pulmonary:     Effort: Pulmonary effort is normal. No respiratory distress.     Breath sounds: Normal breath sounds. No wheezing.  Abdominal:     General: There is no  distension.     Palpations: Abdomen is soft.     Tenderness: There is no abdominal tenderness.  Musculoskeletal:        General: Normal range of motion.     Cervical back: Normal range of motion.  Skin:    General: Skin is warm.  Neurological:     Mental Status: She is alert and oriented to person, place, and time.  Psychiatric:        Mood and Affect: Mood normal.        Behavior: Behavior normal.      Vitals:   11/10/20 0857  BP: 102/67  Pulse: 80  SpO2: 100%  Weight: 59.4 kg  Height: '4\' 11"'  (1.499 m)    BMP Latest Ref Rng & Units 09/23/2020 06/11/2020 04/12/2020  Glucose 70 - 99 mg/dL 101(H) 89 91  BUN 8 - 23 mg/dL 10 7(L) 18  Creatinine 0.44 - 1.00 mg/dL 0.37(L) 0.59 0.60  BUN/Creat Ratio 12 - 28 - 12 -  Sodium 135 - 145 mmol/L 138 140 142  Potassium 3.5 - 5.1 mmol/L 3.6 4.3 3.9  Chloride 98 - 111 mmol/L 106 104 104  CO2 22 - 32 mmol/L 24 - -  Calcium 8.9 - 10.3 mg/dL 9.4 9.3 -   Hepatic Function Latest Ref Rng & Units 06/11/2020  Total Protein 6.0 - 8.5 g/dL 6.8  Albumin 3.8 - 4.8 g/dL 4.3  AST 0 - 40 IU/L 23  Alk Phosphatase 44 - 121 IU/L 117  Total Bilirubin 0.0 - 1.2 mg/dL 0.4   CBC Latest Ref Rng & Units 09/23/2020 06/11/2020 04/12/2020  WBC 4.0 - 10.5 K/uL 9.1 6.3 -  Hemoglobin 12.0 - 15.0 g/dL 13.8 13.8 13.9  Hematocrit 36.0 - 46.0 % 39.6 41.3 41.0  Platelets 150 - 400 K/uL 268 298  -   Lipid Panel     Component Value Date/Time   CHOL 179 06/11/2020 0845   TRIG 128 06/11/2020 0845   HDL 41 06/11/2020 0845   CHOLHDL 4.4 06/11/2020 0845   LDLCALC 115 (H) 06/11/2020 0845   LABVLDL 23 06/11/2020 0845        Assessment & Plan:  I personally reviewed all images and lab data in the [facility] system as well as any outside material available during this office visit and agree with the  radiology impressions.   Essential hypertension Continue losartan 100 mg daily.  Follow up at next appointment  Bilateral hearing loss due to cerumen impaction Advised to keep appointment with ENT in August. Continue to use debrox ear drops at home. Follow up at next appointment  Elevated LDL cholesterol level Start atorvastatin 10 mg daily. Discussed healthy eating habits and instituting a low fat diet. Provided patient with healthy eating guide in spanish. Follow up at next appointment.    Courteny was seen today for hypertension.  Diagnoses and all orders for this visit:  Elevated LDL cholesterol level -     atorvastatin (LIPITOR) 10 MG tablet; TAKE 1 TABLET (10 MG TOTAL) BY MOUTH DAILY.  Essential hypertension  Bilateral hearing loss due to cerumen impaction  Other orders -     Varicella-zoster vaccine IM   Pt agrees to zoster vaccine  I spent 36 min on this visit including face to face time and complex decision making.  Get Mammogram rescheduled with scholarship program

## 2020-11-10 ENCOUNTER — Ambulatory Visit: Payer: Self-pay | Attending: Critical Care Medicine | Admitting: Critical Care Medicine

## 2020-11-10 ENCOUNTER — Other Ambulatory Visit: Payer: Self-pay

## 2020-11-10 ENCOUNTER — Encounter: Payer: Self-pay | Admitting: Critical Care Medicine

## 2020-11-10 DIAGNOSIS — I10 Essential (primary) hypertension: Secondary | ICD-10-CM

## 2020-11-10 DIAGNOSIS — E7800 Pure hypercholesterolemia, unspecified: Secondary | ICD-10-CM

## 2020-11-10 DIAGNOSIS — H6123 Impacted cerumen, bilateral: Secondary | ICD-10-CM

## 2020-11-10 DIAGNOSIS — Z23 Encounter for immunization: Secondary | ICD-10-CM

## 2020-11-10 MED ORDER — ATORVASTATIN CALCIUM 10 MG PO TABS
ORAL_TABLET | Freq: Every day | ORAL | 1 refills | Status: DC
  Filled 2020-11-10: qty 30, 30d supply, fill #0

## 2020-11-10 NOTE — Assessment & Plan Note (Signed)
Start atorvastatin 10 mg daily. Discussed healthy eating habits and instituting a low fat diet. Provided patient with healthy eating guide in spanish. Follow up at next appointment.

## 2020-11-10 NOTE — Assessment & Plan Note (Signed)
Advised to keep appointment with ENT in August. Continue to use debrox ear drops at home. Follow up at next appointment

## 2020-11-10 NOTE — Patient Instructions (Addendum)
A shingles vaccine given  Start atorvastatin one daily  No other medication changes  A mammogram will be ordered with scholarship program  Keep follow up appts with ENT and foot doctors  Return Dr Delford Field 4 months  Neomia Dear vacuna contra la culebrilla administrada  Iniciar atorvastatina una vez al da  No hay otros cambios en la medicacin  Se ordenar una mamografa con el programa de becas.  Mantenga citas de seguimiento con otorrinolaringlogos y Web designer Dr. Delford Field 4 meses   [email or web address].[email or web address]">  Plan de alimentacin DASH DASH Eating Plan DASH es la sigla en ingls de "Enfoques Alimentarios para Detener la Hipertensin". El plan de alimentacin DASH ha demostrado:  Bajar la presin arterial elevada (hipertensin).  Reducir el riesgo de diabetes tipo 2, enfermedad cardaca y accidente cerebrovascular.  Ayudar a perder peso. Consejos para seguir Consulting civil engineer las etiquetas de los alimentos  Verifique la cantidad de sal (sodio) por porcin en las etiquetas de los alimentos. Elija alimentos con menos del 5 por ciento del valor diario de sodio. Generalmente, los alimentos con menos de 300 miligramos (mg) de sodio por porcin se encuadran dentro de este plan alimentario.  Para encontrar cereales integrales, busque la palabra "integral" como primera palabra en la lista de ingredientes. Al ir de compras  Compre productos en los que en su etiqueta diga: "bajo contenido de sodio" o "sin agregado de sal".  Compre alimentos frescos. Evite los alimentos enlatados y comidas precocidas o congeladas. Al cocinar  Evite agregar sal cuando cocine. Use hierbas o aderezos sin sal, en lugar de sal de mesa o sal marina. Consulte al mdico o farmacutico antes de usar sustitutos de la sal.  No fra los alimentos. A la hora de cocinar los alimentos opte por hornearlos, hervirlos, grillarlos, asarlos al horno y asarlos a Production assistant, radio.  Cocine con aceites cardiosaludables, como oliva, canola, aguacate, soja o girasol. Planificacin de las comidas  Consuma una dieta equilibrada, que incluya lo siguiente: ? 4o ms porciones de frutas y 4 o ms porciones de Warehouse manager. Trate de que medio plato de cada comida sea de frutas y verduras. ? De 6 a 8porciones de cereales [employer]. ? Menos de 6 onzas (170g) de carne, aves o pescado Copy. Una porcin de 3 onzas (85g) de carne tiene casi el mismo tamao que un mazo de cartas. Un huevo equivale a 1 onza (28g). ? De 2 a 3 porciones de productos lcteos descremados por da. Una porcin es 1taza ( ). ? 1 porcin de frutos secos, semillas o frijoles 5 veces por semana. ? De 2 a 3 porciones de grasas cardiosaludables. Las grasas saludables llamadas cidos grasos omega-3 se encuentran en alimentos como las nueces, las semillas de [place], las leches fortificadas y [place]. Estas grasas tambin se encuentran en los pescados de agua fra, como la sardina, el salmn y la caballa.  Limite la cantidad que consume de: ? Alimentos enlatados o envasados. ? Alimentos con alto contenido de grasa trans, como algunos alimentos fritos. ? Alimentos con alto contenido de grasa saturada, como carne con grasa. ? Postres y otros dulces, bebidas azucaradas y otros alimentos con azcar agregada. ? Productos lcteos enteros.  No le agregue sal a los alimentos antes de probarlos.  No coma ms de 4 yemas de huevo por semana.  Trate de comer al menos 2 comidas vegetarianas por semana.  Consuma ms comida casera y menos de restaurante, de bares y comida rpida.  Estilo de vida  Cuando coma en un restaurante, pida que preparen su comida con menos sal o, en lo posible, sin nada de sal.  Si bebe alcohol: ? Limite la cantidad que bebe:  De 0 a 1 medida por da para las mujeres que no estn embarazadas.  De 0 a 2 medidas por da para los hombres. ? Est  atento a la cantidad de alcohol que hay en las bebidas que toma. En los [place], una medida equivale a una botella de cerveza de 12oz ( ), un vaso de vino de 5oz ( ) o un vaso de una bebida alcohlica de alta graduacin de 1oz (30ml). Informacin general  Evite ingerir ms de 2300 mg de sal por da. Si tiene hipertensin, es posible que necesite reducir la ingesta de sodio a 1,500 mg por da.  Trabaje con su mdico para mantener un peso saludable o perder [employer]. Pregntele cul es el peso recomendado para usted.  Realice al menos 30 minutos de ejercicio que haga que se acelere su corazn (ejercicio Magazine features editor) la [employer] de la [place]. Estas actividades pueden incluir caminar, nadar o andar en bicicleta.  Trabaje con su mdico o nutricionista para ajustar su plan alimentario a sus necesidades calricas personales. Qu alimentos debo comer? Frutas Todas las frutas frescas, congeladas o disecadas. Frutas enlatadas en jugo natural (sin agregado de azcar). Verduras Verduras frescas o congeladas (crudas, al vapor, asadas o grilladas). Jugos de tomate y verduras con bajo contenido de sodio o reducidos en sodio. Salsa y pasta de tomate con bajo contenido de sodio o reducidas en sodio. Verduras enlatadas con bajo contenido de sodio o reducidas en sodio. Granos Pan de salvado o integral. Pasta de salvado o integral. Arroz integral. Avena. Quinua. Trigo burgol. Cereales integrales y con bajo contenido de [place]. Pan pita. Galletitas de [place] con bajo contenido de [place] (the [place] of 60 deg [place]) y [place]. Tortillas de [place] integral. Carnes y otras protenas Pollo o pavo sin piel. Carne de pollo o de [place]. Cerdo desgrasado. Pescado y [employer]. Claras de huevo. Porotos, guisantes o lentejas secos. Frutos secos, mantequilla de frutos secos y semillas sin sal. Frijoles enlatados sin sal. Cortes de carne vacuna magra, desgrasada. Carne precocida o curada magra y baja en sodio, como embutidos o panes de  carne. Lcteos Leche descremada (1%) o descremada. Quesos reducidos en grasa, con bajo contenido de grasa o descremados. Queso blanco o ricota sin grasa, con bajo contenido de [place]. Yogur semidescremado o descremado. Queso con bajo contenido de [place] y [place]. Grasas y [employer] untables que no contengan grasas trans. Aceite vegetal. Jerolyn Shin y aderezos para ensaladas livianos, reducidos en grasa o con bajo contenido de grasas (reducidos en sodio). Aceite de canola, crtamo, oliva, aguacate, soja y [place]. Agu[place]. Alios y condimentos Hierbas. Especias. Mezclas de condimentos sin sal. Otros alimentos Palomitas de maz y pretzels sin sal. Dulces con bajo contenido de grasas. Es posible que los productos que se enumeran ms [place] no constituyan una lista completa de los alimentos y las bebidas que puede tomar. Consulte a un nutricionista para obtener ms informacin. Qu alimentos debo evitar? Nils Pyle Fruta enlatada en almbar liviano o espeso. Frutas cocidas en aceite. Frutas con salsa de crema o mantequilla. Verduras Verduras con crema o fritas. Verduras en salsa de [place]. Verduras enlatadas regulares (que no sean con bajo contenido de sodio o reducidas en sodio). Pasta y salsa de tomates enlatadas regulares (que no sean con bajo contenido de sodio o reducidas en sodio). Jugos de [place]  y verduras regulares (que no sean con bajo contenido de sodio o reducidos en sodio). Pepinillos. Aceitunas. Granos Productos de panificacin hechos con grasa, como medialunas, magdalenas y algunos panes. Comidas con arroz o pasta seca listas para usar. Carnes y [address] de carne con alto contenido de Holiday representative. Costillas. Carne frita. [place]. [place], salame y otras carnes precocidas o curadas, como embutidos o panes de carne. Grasa de la espalda del cerdo (panceta). Salchicha de cerdo. Frutos secos y semillas con sal. Frijoles enlatados con agregado de sal. Pescado enlatado o ahumado.  Huevos enteros o yemas. Pollo o pavo con piel. Lcteos Leche entera o al 2%, crema y [address] y mitad crema. Queso crema entero o con toda su grasa. Yogur entero o endulzado. Quesos con toda su grasa. Sustitutos de cremas no lcteas. Coberturas batidas. Quesos para untar y quesos procesados. Grasas y [employer]. Margarina en barra. Manteca de cerdo. Lardo. Mantequilla clarificada. Grasa de panceta. Aceites tropicales como aceite de coco, palmiste o palma. Alios y condimentos Sal de cebolla, sal de ajo, sal condimentada, sal de mesa y sal marina. Salsa [place]. Salsa trtara. Salsa barbacoa. Salsa teriyaki. Salsa de soja, incluso la que tiene contenido reducido de [place]. Salsa de carne. Salsas en lata y envasadas. Salsa de pescado. Salsa de [place]. Salsa rosada. Rbanos picantes comprados en tiendas. [place]. [place]. Saborizantes y tiernizantes para carne. Caldo en cubitos. Salsas picantes. Adobos preelaborados o envasados. Aderezos para tacos preelaborados o envasados. Salsas de pepinillos. Aderezos comunes para ensalada. Otros alimentos Palomitas de maz y pretzels con sal. Es posible que los productos que se enumeran ms arriba no constituyan una lista completa de los alimentos y las bebidas que Personnel officer. Consulte a un nutricionista para obtener ms informacin. Dnde buscar ms informacin  National Heart, Lung, and Blood Institute (Instituto Nacional del [place], los Pulmones y Risk manager): PopSteam.is  American Heart Association (Asociacin Estadounidense del Corazn): www.heart.org  Academy of Nutrition and Dietetics (Academia de Nutricin y Pension scheme manager): www.eatright.org  National Kidney Foundation (Fundacin Nacional del Rin): www.kidney.org Resumen  El plan de alimentacin DASH ha demostrado bajar la presin arterial elevada (hipertensin). Tambin puede reducir [employer] de diabetes tipo 2, enfermedad cardaca y accidente cerebrovascular.  Cuando siga el  plan de alimentacin DASH, trate de comer ms frutas frescas y verduras, cereales integrales, carnes magras, lcteos descremados y grasas cardiosaludables.  Con el plan de alimentacin DASH, deber limitar el consumo de sal (sodio) a 2,300 mg por da. Si tiene hipertensin, es posible que necesite reducir la ingesta de sodio a 1,500 mg por da.  Trabaje con su mdico o nutricionista para ajustar su plan alimentario a sus necesidades calricas personales. Esta informacin no tiene Theme park manager el consejo del mdico. Asegrese de hacerle al mdico cualquier pregunta que tenga. Document Revised: 06/28/2019 Document Reviewed: 06/28/2019 Elsevier Patient Education  2021 [employer].

## 2020-11-10 NOTE — Assessment & Plan Note (Signed)
Continue losartan 100 mg daily.  Follow up at next appointment

## 2020-11-14 ENCOUNTER — Ambulatory Visit: Payer: Self-pay | Admitting: Otolaryngology

## 2020-12-03 ENCOUNTER — Other Ambulatory Visit: Payer: Self-pay | Admitting: Obstetrics and Gynecology

## 2020-12-03 DIAGNOSIS — Z1231 Encounter for screening mammogram for malignant neoplasm of breast: Secondary | ICD-10-CM

## 2020-12-25 ENCOUNTER — Other Ambulatory Visit: Payer: Self-pay | Admitting: Critical Care Medicine

## 2020-12-25 DIAGNOSIS — I10 Essential (primary) hypertension: Secondary | ICD-10-CM

## 2020-12-25 MED ORDER — LOSARTAN POTASSIUM 100 MG PO TABS: ORAL_TABLET | Freq: Every day | ORAL | 0 refills | Status: DC

## 2020-12-25 NOTE — Telephone Encounter (Signed)
Medication Refill - Medication: Losartin   Has the patient contacted their pharmacy? No.  She said she cannot get them on the phone (Agent: If no, request that the patient contact the pharmacy for the refill.) (Agent: If yes, when and what did the pharmacy advise?)  Preferred Pharmacy (with phone number or street name): [facility]   [address]  Agent: Please be advised that RX refills may take up to 3 business days. We ask that you follow-up with your pharmacy.

## 2021-01-09 ENCOUNTER — Ambulatory Visit: Payer: Self-pay | Admitting: Otolaryngology

## 2021-01-15 ENCOUNTER — Encounter: Payer: Self-pay | Admitting: *Deleted

## 2021-01-15 ENCOUNTER — Telehealth: Payer: Self-pay | Admitting: Critical Care Medicine

## 2021-01-15 ENCOUNTER — Ambulatory Visit: Payer: PRIVATE HEALTH INSURANCE

## 2021-01-15 NOTE — Telephone Encounter (Signed)
Copied from CRM [id number]. Topic: Appointment Scheduling - Scheduling Inquiry for Clinic >> Jan 13, 2021  2:56 PM Gaetana Michaelis A wrote: Reason for CRM: Patient has had to cancel their mammogram for 01/15/21  The patient would lie to be contacted for rescheduling when possible  The agent was unable to successfully reschedule at the time of call  Please contact further when possible

## 2021-01-15 NOTE — Telephone Encounter (Signed)
Attempted to contact pt and VM is not set up at this time,   When pt call back please give her [phone] to reschedule her mammogram.

## 2021-01-15 NOTE — Telephone Encounter (Signed)
We don't schedule mammograms upfront. Well I never have.

## 2021-01-16 ENCOUNTER — Ambulatory Visit: Payer: Self-pay | Admitting: Otolaryngology

## 2021-02-10 ENCOUNTER — Other Ambulatory Visit: Payer: Self-pay | Admitting: Obstetrics and Gynecology

## 2021-02-10 DIAGNOSIS — Z1231 Encounter for screening mammogram for malignant neoplasm of breast: Secondary | ICD-10-CM

## 2021-02-13 ENCOUNTER — Ambulatory Visit: Payer: Self-pay | Admitting: Otolaryngology

## 2021-02-19 ENCOUNTER — Ambulatory Visit: Payer: Self-pay | Admitting: Physician Assistant

## 2021-02-26 ENCOUNTER — Other Ambulatory Visit: Payer: Self-pay

## 2021-02-26 ENCOUNTER — Ambulatory Visit
Admission: RE | Admit: 2021-02-26 | Discharge: 2021-02-26 | Disposition: A | Discharge status: Home / Self Care | Payer: No Typology Code available for payment source | Source: Ambulatory Visit | Attending: Obstetrics and Gynecology | Admitting: Obstetrics and Gynecology

## 2021-02-26 ENCOUNTER — Ambulatory Visit: Payer: Self-pay | Admitting: *Deleted

## 2021-02-26 VITALS — BP 120/70

## 2021-02-26 DIAGNOSIS — Z1231 Encounter for screening mammogram for malignant neoplasm of breast: Secondary | ICD-10-CM

## 2021-02-26 DIAGNOSIS — Z1239 Encounter for other screening for malignant neoplasm of breast: Secondary | ICD-10-CM

## 2021-02-26 NOTE — Progress Notes (Signed)
Ms. Claudia Hernandez is a 70 y.o. female who presents to [facility] clinic today with no complaints.    Pap Smear: Pap smear not completed today. Last Pap smear was in 2020 at a clinic in [place] and was normal per patient. Per patient has no history of an abnormal Pap smear. Last Pap smear result is not available in Epic.   Physical exam: Breasts Breasts symmetrical. No skin abnormalities bilateral breasts. No nipple retraction bilateral breasts. No nipple discharge bilateral breasts. No lymphadenopathy. No lumps palpated bilateral breasts. No complaints of pain or tenderness on exam.      Pelvic/Bimanual Pap is not indicated today per BCCCP guidelines.   Smoking History: Patient has never smoked.   Patient Navigation: Patient education provided. Access to services provided for patient through [place] program. Spanish interpreter Claudia Hernandez from [facility] provided.   Colorectal Cancer Screening: Per patient has had colonoscopy completed on 09/24/2020 at [facility].  No complaints today.    Breast and Cervical Cancer Risk Assessment: Patient does not have family history of breast cancer, known genetic mutations, or radiation treatment to the chest before age 65. Patient does not have history of cervical dysplasia, immunocompromised, or DES exposure in-utero.  Risk Assessment     Risk Scores       02/26/2021   Last edited by: Claudia Rutherford, LPN   5-year risk: 0.8 %   Lifetime risk: 2.4 %           A: BCCCP exam without pap smear No complaints.  P: Referred patient to the [facility] for a screening mammogram on mobile unit. Appointment scheduled Thursday, February 26, 2021 at 1040.  Claudia Heidelberg, RN 02/26/2021 9:40 AM

## 2021-02-26 NOTE — Patient Instructions (Signed)
Explained breast self awareness with Claudia Hernandez. Patient did not need a Pap smear today due to last Pap smear was in 2020 per patient. Let her know that since she is over 41 and has no history of an abnormal Pap smear that she doesn't need any further Pap smear. Referred patient to the [facility] for a screening mammogram on mobile unit. Appointment scheduled Thursday, February 26, 2021 at 1040. Patient escorted to the mobile unit following BCCCP appointment for her screening mammogram. Let patient know the Breast Center will follow up with her within the next couple weeks with results of her mammogram by letter or phone. Claudia Hernandez verbalized understanding.  Abelina Ketron, Kathaleen Maser, RN 9:40 AM

## 2021-03-04 ENCOUNTER — Telehealth: Payer: Self-pay | Admitting: Critical Care Medicine

## 2021-03-04 NOTE — Telephone Encounter (Signed)
I return Pt call, schedule a financial appt for 03/13/21

## 2021-03-04 NOTE — Telephone Encounter (Signed)
Copied from CRM [id number]. Topic: Appointment Scheduling - Scheduling Inquiry for Clinic >> Mar 04, 2021 11:14 AM Randol Kern wrote: Reason for CRM: Pt wants to schedule an appt to renew her orange card with Duard Larsen contact: [phone]

## 2021-03-13 ENCOUNTER — Ambulatory Visit: Payer: No Typology Code available for payment source

## 2021-03-24 ENCOUNTER — Other Ambulatory Visit: Payer: Self-pay

## 2021-03-24 ENCOUNTER — Ambulatory Visit: Payer: Self-pay | Attending: Critical Care Medicine | Admitting: Critical Care Medicine

## 2021-03-24 ENCOUNTER — Ambulatory Visit: Payer: Self-pay

## 2021-03-24 ENCOUNTER — Encounter: Payer: Self-pay | Admitting: Critical Care Medicine

## 2021-03-24 DIAGNOSIS — E7800 Pure hypercholesterolemia, unspecified: Secondary | ICD-10-CM

## 2021-03-24 DIAGNOSIS — H6123 Impacted cerumen, bilateral: Secondary | ICD-10-CM

## 2021-03-24 DIAGNOSIS — M25561 Pain in right knee: Secondary | ICD-10-CM

## 2021-03-24 DIAGNOSIS — G8929 Other chronic pain: Secondary | ICD-10-CM

## 2021-03-24 DIAGNOSIS — M25562 Pain in left knee: Secondary | ICD-10-CM

## 2021-03-24 DIAGNOSIS — I10 Essential (primary) hypertension: Secondary | ICD-10-CM

## 2021-03-24 MED ORDER — LOSARTAN POTASSIUM 100 MG PO TABS
100.0000 mg | ORAL_TABLET | Freq: Every day | ORAL | 2 refills | Status: DC
  Filled 2021-03-24: qty 30, 30d supply, fill #0

## 2021-03-24 MED ORDER — ATORVASTATIN CALCIUM 10 MG PO TABS
10.0000 mg | ORAL_TABLET | Freq: Every day | ORAL | 1 refills | Status: DC
  Filled 2021-03-24: qty 90, 90d supply, fill #0
  Filled 2021-03-24: qty 30, 30d supply, fill #0

## 2021-03-24 NOTE — Assessment & Plan Note (Signed)
Patient mistakenly stopped her atorvastatin refills given ASAP asked patient to read new this and we will recheck lipid panel

## 2021-03-24 NOTE — Progress Notes (Signed)
Established Patient Office Visit  Subjective:  Patient ID: Claudia Hernandez, female    DOB: 1950/10/23  Age: 70 y.o. MRN: [id number] Virtual Visit via Telephone Note  I connected with Claudia Hernandez on 03/24/21 at  8:30 AM EDT by telephone and verified that I am speaking with the correct person using two identifiers.   Consent:  I discussed the limitations, risks, security and privacy concerns of performing an evaluation and management service by telephone and the availability of in person appointments. I also discussed with the patient that there may be a patient responsible charge related to this service. The patient expressed understanding and agreed to proceed.  Location of patient: Patient is at home  Location of provider: I am in the office  Persons participating in the televisit with the patient.   Spanish interpretation provided by Claudia Hernandez from [place] interpreters [phone], no one else on the call   History of Present Illness: CC: pcp fu  HPI Claudia Hernandez presents for primary care follow-up visit by way of a phone visit.  This visit was assisted by Spanish interpreter Claudia Hernandez  The patient since the last visit in June has been doing very well.  She still complains of decreased hearing and has a pending appointment with ENT.  Patient is requesting her second shingles vaccine and a flu vaccine we will need to arrange a nurse visit for this.  Patient does not monitor her blood pressure at home blood pressure was good at the last visit and she is compliant with her losartan 100 mg daily.  She did run out of her atorvastatin and did not get it refilled as she misunderstood that she has to maintain this chronically.  Patient did have a recent mammogram which was negative.  She has no other real complaints at this visit other than chronic bilateral knee pain.  She would like this assessed when she sees me face-to-face.    Past Medical  History:  Diagnosis Date   Arthritis    Asthma    hx of   Heart murmur    unsure of this, but was told she had one by MD in [place]   Hypertension     Past Surgical History:  Procedure Laterality Date   BLADDER SURGERY     CESAREAN SECTION     CHOLECYSTECTOMY     gallbladder removed     left foot surgery      Family History  Problem Relation Age of Onset   Drug abuse Neg Hx    Hypertension Neg Hx    Colon cancer Neg Hx    Esophageal cancer Neg Hx    Rectal cancer Neg Hx    Stomach cancer Neg Hx    Breast cancer Neg Hx     Social History   Socioeconomic History   Marital status: Single    Spouse name: Not on file   Number of children: 5   Years of education: Not on file   Highest education level: Bachelor's degree (e.g., BA, AB, BS)  Occupational History   Not on file  Tobacco Use   Smoking status: Never   Smokeless tobacco: Never  Vaping Use   Vaping Use: Never used  Substance and Sexual Activity   Alcohol use: Never   Drug use: Never   Sexual activity: Not Currently  Other Topics Concern   Not on file  Social History Narrative   Not on file   Social Determinants  of Health   Financial Resource Strain: Not on file  Food Insecurity: Food Insecurity Present   Worried About Programme researcher, broadcasting/film/video in the Last Year: Sometimes true   Ran Out of Food in the Last Year: Sometimes true  Transportation Needs: Unmet Transportation Needs   Lack of Transportation (Medical): Yes   Lack of Transportation (Non-Medical): Yes  Physical Activity: Not on file  Stress: Not on file  Social Connections: Not on file  Intimate Partner Violence: Not on file    Outpatient Medications Prior to Visit  Medication Sig Dispense Refill   ACETAMINOPHEN PO Take by mouth. PRN     COLLAGEN PO Take by mouth.     losartan (COZAAR) 100 MG tablet TAKE 1 TABLET (100 MG TOTAL) BY MOUTH DAILY. 90 tablet 0   meloxicam (MOBIC) 15 MG tablet TAKE 1 TABLET (15 MG TOTAL) BY MOUTH DAILY. 30 tablet 1    nystatin-triamcinolone ointment (MYCOLOG) APPLY 1 APPLICATION TOPICALLY 2 (TWO) TIMES DAILY. 30 g 0   atorvastatin (LIPITOR) 10 MG tablet TAKE 1 TABLET (10 MG TOTAL) BY MOUTH DAILY. (Patient not taking: Reported on 03/24/2021) 90 tablet 1   carbamide peroxide (DEBROX) 6.5 % OTIC solution Place 5 drops into the right ear 2 (two) times daily. (Patient not taking: No sig reported) 15 mL 2   No facility-administered medications prior to visit.    No Known Allergies  ROS Review of Systems  Constitutional:  Negative for chills, diaphoresis and fever.  HENT:  Positive for hearing loss. Negative for congestion, nosebleeds, sore throat and tinnitus.   Eyes:  Negative for photophobia and redness.  Respiratory:  Negative for cough, shortness of breath, wheezing and stridor.   Cardiovascular:  Negative for chest pain, palpitations and leg swelling.  Gastrointestinal:  Negative for abdominal pain, blood in stool, constipation, diarrhea, nausea and vomiting.  Endocrine: Negative for polydipsia.  Genitourinary:  Negative for dysuria, flank pain, frequency, hematuria and urgency.  Musculoskeletal:  Positive for joint swelling. Negative for back pain, myalgias and neck pain.       Bilat knee pain  Skin:  Negative for rash.  Allergic/Immunologic: Negative for environmental allergies.  Neurological:  Negative for dizziness, tremors, seizures, weakness and headaches.  Hematological:  Does not bruise/bleed easily.  Psychiatric/Behavioral:  Negative for suicidal ideas. The patient is not nervous/anxious.      Objective:    Physical Exam No exam this is a phone visit There were no vitals taken for this visit. Wt Readings from Last 3 Encounters:  11/10/20 131 lb (59.4 kg)  09/24/20 134 lb (60.8 kg)  09/23/20 132 lb 4.4 oz (60 kg)     Health Maintenance Due  Topic Date Due   COVID-19 Vaccine (4 - Booster) 12/19/2020    There are no preventive care reminders to display for this patient.  Lab  Results  Component Value Date   TSH 1.870 07/20/2019   Lab Results  Component Value Date   WBC 9.1 09/23/2020   HGB 13.8 09/23/2020   HCT 39.6 09/23/2020   MCV 104.8 (H) 09/23/2020   PLT 268 09/23/2020   Lab Results  Component Value Date   NA 138 09/23/2020   K 3.6 09/23/2020   CO2 24 09/23/2020   GLUCOSE 101 (H) 09/23/2020   BUN 10 09/23/2020   CREATININE 0.37 (L) 09/23/2020   BILITOT 0.4 06/11/2020   ALKPHOS 117 06/11/2020   AST 23 06/11/2020   PROT 6.8 06/11/2020   ALBUMIN 4.3 06/11/2020  CALCIUM 9.4 09/23/2020   ANIONGAP 8 09/23/2020   Lab Results  Component Value Date   CHOL 179 06/11/2020   Lab Results  Component Value Date   HDL 41 06/11/2020   Lab Results  Component Value Date   LDLCALC 115 (H) 06/11/2020   Lab Results  Component Value Date   TRIG 128 06/11/2020   Lab Results  Component Value Date   CHOLHDL 4.4 06/11/2020   No results found for: HGBA1C    Assessment & Plan:   Problem List Items Addressed This Visit       Cardiovascular and Mediastinum   Essential hypertension    Hypertension well controlled by report we will refill losartan and bring the patient in for a metabolic panel and blood counts      Relevant Medications   atorvastatin (LIPITOR) 10 MG tablet   losartan (COZAAR) 100 MG tablet   Other Relevant Orders   Comprehensive metabolic panel   Lipid panel   CBC with Differential/Platelet     Nervous and Auditory   Bilateral hearing loss due to cerumen impaction    Patient encouraged to follow-up with ENT on her hearing loss        Other   Elevated LDL cholesterol level    Patient mistakenly stopped her atorvastatin refills given ASAP asked patient to read new this and we will recheck lipid panel      Relevant Medications   atorvastatin (LIPITOR) 10 MG tablet   Other Relevant Orders   Lipid panel   Bilateral knee pain    Chronic bilateral knee pain we will assess this at the next visit face-to-face        Meds ordered this encounter  Medications   atorvastatin (LIPITOR) 10 MG tablet    Sig: Take 1 tablet (10 mg total) by mouth daily.    Dispense:  90 tablet    Refill:  1   losartan (COZAAR) 100 MG tablet    Sig: Take 1 tablet (100 mg total) by mouth daily.    Dispense:  90 tablet    Refill:  2    Follow-up: Return in about 6 weeks (around 05/05/2021).   Follow Up Instructions: Patient knows a lab and nurse visit appointment will be made so she can receive her second shingles vaccine and flu vaccine and obtain labs and an appointment with me face-to-face will occur in early December   I discussed the assessment and treatment plan with the patient. The patient was provided an opportunity to ask questions and all were answered. The patient agreed with the plan and demonstrated an understanding of the instructions.   The patient was advised to call back or seek an in-person evaluation if the symptoms worsen or if the condition fails to improve as anticipated.  I provided 25 minutes of non-face-to-face time during this encounter  including  median intraservice time , review of notes, labs, imaging, medications  and explaining diagnosis and management to the patient .        Shan Levans, MD

## 2021-03-24 NOTE — Assessment & Plan Note (Signed)
Hypertension well controlled by report we will refill losartan and bring the patient in for a metabolic panel and blood counts

## 2021-03-24 NOTE — Assessment & Plan Note (Signed)
Patient encouraged to follow-up with ENT on her hearing loss

## 2021-03-24 NOTE — Assessment & Plan Note (Signed)
Chronic bilateral knee pain we will assess this at the next visit face-to-face

## 2021-03-25 LAB — CBC WITH DIFFERENTIAL/PLATELET
Basophils Absolute: 0.1 10*3/uL (ref 0.0–0.2)
Basos: 1 %
EOS (ABSOLUTE): 0.8 10*3/uL — ABNORMAL HIGH (ref 0.0–0.4)
Eos: 11 %
Hematocrit: 39.3 % (ref 34.0–46.6)
Hemoglobin: 13.5 g/dL (ref 11.1–15.9)
Immature Grans (Abs): 0 10*3/uL (ref 0.0–0.1)
Immature Granulocytes: 0 %
Lymphocytes Absolute: 2 10*3/uL (ref 0.7–3.1)
Lymphs: 27 %
MCH: 36.5 pg — ABNORMAL HIGH (ref 26.6–33.0)
MCHC: 34.4 g/dL (ref 31.5–35.7)
MCV: 106 fL — ABNORMAL HIGH (ref 79–97)
Monocytes Absolute: 0.4 10*3/uL (ref 0.1–0.9)
Monocytes: 6 %
Neutrophils Absolute: 4.3 10*3/uL (ref 1.4–7.0)
Neutrophils: 55 %
Platelets: 279 10*3/uL (ref 150–450)
RBC: 3.7 x10E6/uL — ABNORMAL LOW (ref 3.77–5.28)
RDW: 13.2 % (ref 11.7–15.4)
WBC: 7.7 10*3/uL (ref 3.4–10.8)

## 2021-03-25 LAB — COMPREHENSIVE METABOLIC PANEL
ALT: 22 IU/L (ref 0–32)
AST: 27 IU/L (ref 0–40)
Albumin/Globulin Ratio: 1.7 (ref 1.2–2.2)
Albumin: 4.8 g/dL (ref 3.8–4.8)
Alkaline Phosphatase: 87 IU/L (ref 44–121)
BUN/Creatinine Ratio: 19 (ref 12–28)
BUN: 11 mg/dL (ref 8–27)
Bilirubin Total: 0.5 mg/dL (ref 0.0–1.2)
CO2: 24 mmol/L (ref 20–29)
Calcium: 9.5 mg/dL (ref 8.7–10.3)
Chloride: 100 mmol/L (ref 96–106)
Creatinine, Ser: 0.59 mg/dL (ref 0.57–1.00)
Globulin, Total: 2.8 g/dL (ref 1.5–4.5)
Glucose: 88 mg/dL (ref 70–99)
Potassium: 4.1 mmol/L (ref 3.5–5.2)
Sodium: 141 mmol/L (ref 134–144)
Total Protein: 7.6 g/dL (ref 6.0–8.5)
eGFR: 97 mL/min/{1.73_m2} (ref >59)

## 2021-03-25 LAB — LIPID PANEL
Chol/HDL Ratio: 4.2 ratio (ref 0.0–4.4)
Cholesterol, Total: 175 mg/dL (ref 100–199)
HDL: 42 mg/dL (ref >39)
LDL Chol Calc (NIH): 102 mg/dL — ABNORMAL HIGH (ref 0–99)
Triglycerides: 175 mg/dL — ABNORMAL HIGH (ref 0–149)
VLDL Cholesterol Cal: 31 mg/dL (ref 5–40)

## 2021-03-26 ENCOUNTER — Telehealth: Payer: Self-pay

## 2021-03-26 NOTE — Telephone Encounter (Signed)
Patient was called and informed of lab results. Patient had no questions.    Interpreter:Ivan [id number]

## 2021-05-10 NOTE — Progress Notes (Signed)
Established Patient Office Visit  Subjective:  Patient ID: Claudia Hernandez, female    DOB: 1950-12-20  Age: 70 y.o. MRN: [id number]  CC: decreased hearing, ear wax   HPI Spanish video interpreter Abigail Butts assisted with the visit [place] presents for primary care follow-up visit The patient states she is still having difficulty hearing through her ears.  She is yet to go to ENT to have the ears cleaned.  She also has a slight cough with this.  The patient's been compliant with her medications and would like a refill on her medications today for both her blood pressure and cholesterol.  Patient has no other real complaints at this time except for chronic bilateral knee pain.  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     Past Medical History:  Diagnosis Date   Arthritis    Asthma    hx of   Heart murmur    unsure of this, but was told she had one by MD in [place]   Hypertension     Past Surgical History:  Procedure Laterality Date   BLADDER SURGERY     CESAREAN SECTION     CHOLECYSTECTOMY     gallbladder removed     left foot surgery      Family History  Problem Relation Age of Onset   Drug abuse Neg Hx    Hypertension Neg Hx    Colon cancer Neg Hx    Esophageal cancer Neg Hx    Rectal cancer Neg Hx    Stomach cancer Neg Hx    Breast cancer Neg Hx     Social History   Socioeconomic History   Marital status: Single    Spouse name: Not on file   Number of children: 5   Years of education: Not on file   Highest education level: Bachelor's degree (e.g., BA, AB, BS)  Occupational History   Not on file  Tobacco Use   Smoking status: Never   Smokeless tobacco: Never  Vaping Use   Vaping Use: Never used  Substance and Sexual Activity   Alcohol use: Never   Drug use: Never   Sexual activity: Not Currently  Other Topics Concern    Not on file  Social History Narrative   Not on file   Social Determinants of Health   Financial Resource Strain: Not on file  Food Insecurity: Food Insecurity Present   Worried About [place] in the Last Year: Sometimes true   Ran Out of Food in the Last Year: Sometimes true  Transportation Needs: Unmet Transportation Needs   Lack of Transportation (Medical): Yes   Lack of Transportation (Non-Medical): Yes  Physical Activity: Not on file  Stress: Not on file  Social Connections: Not on file  Intimate Partner Violence: Not on file    Outpatient Medications Prior to Visit  Medication Sig Dispense Refill   ACETAMINOPHEN PO Take by mouth. PRN     COLLAGEN PO Take by mouth.     atorvastatin (LIPITOR) 10 MG tablet Take 1 tablet (10 mg total) by mouth daily. 90 tablet 1   losartan (COZAAR) 100 MG tablet Take 1 tablet (100 mg total) by mouth daily. 90 tablet 2   No facility-administered medications prior to visit.    No Known Allergies  ROS Review of Systems  Constitutional:  Negative for  chills, diaphoresis and fever.  HENT:  Positive for hearing loss, postnasal drip and rhinorrhea. Negative for congestion, ear discharge, ear pain, nosebleeds, sore throat and tinnitus.   Eyes:  Negative for photophobia and redness.  Respiratory:  Positive for cough and wheezing. Negative for chest tightness, shortness of breath and stridor.        Prod phlegm, white  Cardiovascular:  Negative for chest pain, palpitations and leg swelling.  Gastrointestinal:  Negative for abdominal pain, blood in stool, constipation, diarrhea, nausea and vomiting.  Endocrine: Negative for polydipsia.  Genitourinary:  Negative for dysuria, flank pain, frequency, hematuria and urgency.  Musculoskeletal:  Negative for back pain, myalgias and neck pain.  Skin:  Negative for rash.  Allergic/Immunologic: Negative for environmental allergies.  Neurological:  Negative for dizziness, tremors, seizures, weakness  and headaches.  Hematological:  Does not bruise/bleed easily.  Psychiatric/Behavioral: Negative.  Negative for dysphoric mood, hallucinations, sleep disturbance and suicidal ideas. The patient is not nervous/anxious.      Objective:    Physical Exam Vitals reviewed.  Constitutional:      Appearance: Normal appearance. She is well-developed. She is not diaphoretic.  HENT:     Head: Normocephalic and atraumatic.     Right Ear: There is impacted cerumen.     Left Ear: There is impacted cerumen.     Nose: Nose normal. No nasal deformity, septal deviation, mucosal edema or rhinorrhea.     Right Sinus: No maxillary sinus tenderness or frontal sinus tenderness.     Left Sinus: No maxillary sinus tenderness or frontal sinus tenderness.     Mouth/Throat:     Mouth: Mucous membranes are moist.     Pharynx: Oropharynx is clear. No oropharyngeal exudate.  Eyes:     General: No scleral icterus.    Conjunctiva/sclera: Conjunctivae normal.     Pupils: Pupils are equal, round, and reactive to light.  Neck:     Thyroid: No thyromegaly.     Vascular: No carotid bruit or JVD.     Trachea: Trachea normal. No tracheal tenderness or tracheal deviation.  Cardiovascular:     Rate and Rhythm: Normal rate and regular rhythm.     Chest Wall: PMI is not displaced.     Pulses: Normal pulses. No decreased pulses.     Heart sounds: Normal heart sounds, S1 normal and S2 normal. Heart sounds not distant. No murmur heard. No systolic murmur is present.  No diastolic murmur is present.    No friction rub. No gallop. No S3 or S4 sounds.  Pulmonary:     Effort: Pulmonary effort is normal. No tachypnea, accessory muscle usage or respiratory distress.     Breath sounds: Normal breath sounds. No stridor. No decreased breath sounds, wheezing, rhonchi or rales.  Chest:     Chest wall: No tenderness.  Abdominal:     General: Bowel sounds are normal. There is no distension.     Palpations: Abdomen is soft. Abdomen  is not rigid.     Tenderness: There is no abdominal tenderness. There is no guarding or rebound.  Musculoskeletal:        General: Tenderness present. Normal range of motion.     Cervical back: Normal range of motion and neck supple. No edema, erythema or rigidity. No muscular tenderness. Normal range of motion.     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     Left side of head: No submental or submandibular adenopathy.     Cervical: No cervical adenopathy.  Skin:    General: Skin is warm and dry.     Coloration: Skin is not pale.     Findings: No rash.     Nails: There is no clubbing.  Neurological:     Mental Status: She is alert and oriented to person, place, and time.     Sensory: No sensory deficit.  Psychiatric:        Mood and Affect: Mood normal.        Speech: Speech normal.        Behavior: Behavior normal.        Thought Content: Thought content normal.        Judgment: Judgment normal.    BP 122/77   Pulse 69   Resp 16   Wt 128 lb 6.4 oz (58.2 kg)   SpO2 97%   BMI 25.93 kg/m  Wt Readings from Last 3 Encounters:  05/11/21 128 lb 6.4 oz (58.2 kg)  11/10/20 131 lb (59.4 kg)  09/24/20 134 lb (60.8 kg)     Health Maintenance Due  Topic Date Due   COVID-19 Vaccine (4 - Booster) 10/14/2020   Pneumonia Vaccine 62+ Years old (2 - PPSV23 if available, else PCV20) 06/16/2021    There are no preventive care reminders to display for this patient.  Lab Results  Component Value Date   TSH 1.870 07/20/2019   Lab Results  Component Value Date   WBC 7.7 03/24/2021   HGB 13.5 03/24/2021   HCT 39.3 03/24/2021   MCV 106 (H) 03/24/2021   PLT 279 03/24/2021   Lab Results  Component Value Date   NA 141 03/24/2021   K 4.1 03/24/2021   CO2 24 03/24/2021   GLUCOSE 88 03/24/2021   BUN 11 03/24/2021   CREATININE 0.59 03/24/2021    BILITOT 0.5 03/24/2021   ALKPHOS 87 03/24/2021   AST 27 03/24/2021   ALT 22 03/24/2021   PROT 7.6 03/24/2021   ALBUMIN 4.8 03/24/2021   CALCIUM 9.5 03/24/2021   ANIONGAP 8 09/23/2020   EGFR 97 03/24/2021   Lab Results  Component Value Date   CHOL 175 03/24/2021   Lab Results  Component Value Date   HDL 42 03/24/2021   Lab Results  Component Value Date   LDLCALC 102 (H) 03/24/2021   Lab Results  Component Value Date   TRIG 175 (H) 03/24/2021   Lab Results  Component Value Date   CHOLHDL 4.2 03/24/2021   No results found for: HGBA1C    Assessment & Plan:   Problem List Items Addressed This Visit       Cardiovascular and Mediastinum   Essential hypertension    Blood pressure under excellent control with losartan continue same refills given      Relevant Medications   losartan (COZAAR) 100 MG tablet   atorvastatin (LIPITOR) 10 MG tablet     Nervous and Auditory   Bilateral hearing loss due to cerumen impaction    Both ears were cleaned with lavage per nursing and we will give the patient prescription for Debrox to follow-up        Other   Elevated LDL cholesterol level    Explained the need for statin therapy she will continue same refills given      Relevant Medications   atorvastatin (LIPITOR) 10 MG tablet   Bilateral knee pain  Knee exercises given to patient       Meds ordered this encounter  Medications   losartan (COZAAR) 100 MG tablet    Sig: Take 1 tablet (100 mg total) by mouth daily.    Dispense:  90 tablet    Refill:  2   atorvastatin (LIPITOR) 10 MG tablet    Sig: Take 1 tablet (10 mg total) by mouth daily.    Dispense:  90 tablet    Refill:  1   carbamide peroxide (DEBROX) 6.5 % OTIC solution    Sig: Place 5 drops into both ears 2 (two) times daily.    Dispense:  15 mL    Refill:  0    Follow-up: Return in about 4 months (around 09/09/2021).    Asencion Noble, MD

## 2021-05-11 ENCOUNTER — Ambulatory Visit: Payer: Self-pay | Attending: Critical Care Medicine | Admitting: Critical Care Medicine

## 2021-05-11 ENCOUNTER — Ambulatory Visit: Payer: No Typology Code available for payment source | Admitting: Critical Care Medicine

## 2021-05-11 ENCOUNTER — Other Ambulatory Visit: Payer: Self-pay

## 2021-05-11 ENCOUNTER — Encounter: Payer: Self-pay | Admitting: Critical Care Medicine

## 2021-05-11 VITALS — BP 122/77 | HR 69 | Resp 16 | Wt 128.4 lb

## 2021-05-11 DIAGNOSIS — M25562 Pain in left knee: Secondary | ICD-10-CM

## 2021-05-11 DIAGNOSIS — E7800 Pure hypercholesterolemia, unspecified: Secondary | ICD-10-CM

## 2021-05-11 DIAGNOSIS — I10 Essential (primary) hypertension: Secondary | ICD-10-CM

## 2021-05-11 DIAGNOSIS — M25561 Pain in right knee: Secondary | ICD-10-CM

## 2021-05-11 DIAGNOSIS — H6123 Impacted cerumen, bilateral: Secondary | ICD-10-CM

## 2021-05-11 DIAGNOSIS — G8929 Other chronic pain: Secondary | ICD-10-CM

## 2021-05-11 MED ORDER — LOSARTAN POTASSIUM 100 MG PO TABS
100.0000 mg | ORAL_TABLET | Freq: Every day | ORAL | 2 refills | Status: DC
  Filled 2021-05-11: qty 90, 90d supply, fill #0

## 2021-05-11 MED ORDER — DEBROX 6.5 % OT SOLN
5.0000 [drp] | Freq: Two times a day (BID) | OTIC | 0 refills | Status: DC
  Filled 2021-05-11: qty 15, 30d supply, fill #0

## 2021-05-11 MED ORDER — ATORVASTATIN CALCIUM 10 MG PO TABS
10.0000 mg | ORAL_TABLET | Freq: Every day | ORAL | 1 refills | Status: DC
  Filled 2021-05-11: qty 90, 90d supply, fill #0

## 2021-05-11 NOTE — Assessment & Plan Note (Signed)
Explained the need for statin therapy she will continue same refills given

## 2021-05-11 NOTE — Patient Instructions (Addendum)
Medication refills sent to our pharmacy for both blood pressure and cholesterol  We cleaned both of your ears out today and please pick up eardrops from our pharmacy to continue to apply into the ear canals once daily  Return to see Dr. Delford Field 4 months  Reabastecimientos de medicamentos enviados a nuestra farmacia tanto para la presin arterial como para el colesterol.  Limpiamos ambos odos hoy y Statistician las gotas para los odos de [place] farmacia para Educational psychologist aplicndolas en los canales auditivos una vez al da.

## 2021-05-11 NOTE — Assessment & Plan Note (Signed)
Blood pressure under excellent control with losartan continue same refills given

## 2021-05-11 NOTE — Assessment & Plan Note (Signed)
Both ears were cleaned with lavage per nursing and we will give the patient prescription for Debrox to follow-up

## 2021-05-11 NOTE — Assessment & Plan Note (Signed)
Knee exercises given to patient

## 2021-05-13 ENCOUNTER — Other Ambulatory Visit: Payer: Self-pay

## 2021-05-14 ENCOUNTER — Other Ambulatory Visit: Payer: Self-pay

## 2021-05-14 ENCOUNTER — Ambulatory Visit: Payer: No Typology Code available for payment source | Admitting: Physician Assistant

## 2021-06-10 ENCOUNTER — Ambulatory Visit: Payer: No Typology Code available for payment source | Admitting: Family Medicine

## 2021-06-15 ENCOUNTER — Ambulatory Visit: Payer: No Typology Code available for payment source | Admitting: Critical Care Medicine

## 2021-08-04 ENCOUNTER — Encounter: Payer: Self-pay | Admitting: Critical Care Medicine

## 2021-08-04 ENCOUNTER — Ambulatory Visit: Payer: Self-pay | Attending: Physician Assistant | Admitting: Critical Care Medicine

## 2021-08-04 ENCOUNTER — Other Ambulatory Visit: Payer: Self-pay

## 2021-08-04 VITALS — BP 121/79 | HR 63 | Wt 129.8 lb

## 2021-08-04 DIAGNOSIS — M25561 Pain in right knee: Secondary | ICD-10-CM

## 2021-08-04 DIAGNOSIS — G8929 Other chronic pain: Secondary | ICD-10-CM

## 2021-08-04 DIAGNOSIS — H60313 Diffuse otitis externa, bilateral: Secondary | ICD-10-CM | POA: Insufficient documentation

## 2021-08-04 DIAGNOSIS — H6123 Impacted cerumen, bilateral: Secondary | ICD-10-CM

## 2021-08-04 DIAGNOSIS — E7800 Pure hypercholesterolemia, unspecified: Secondary | ICD-10-CM

## 2021-08-04 DIAGNOSIS — R739 Hyperglycemia, unspecified: Secondary | ICD-10-CM

## 2021-08-04 DIAGNOSIS — I10 Essential (primary) hypertension: Secondary | ICD-10-CM

## 2021-08-04 DIAGNOSIS — M25562 Pain in left knee: Secondary | ICD-10-CM

## 2021-08-04 HISTORY — DX: Diffuse otitis externa, bilateral: H60.313

## 2021-08-04 MED ORDER — LOSARTAN POTASSIUM 100 MG PO TABS
100.0000 mg | ORAL_TABLET | Freq: Every day | ORAL | 2 refills | Status: DC
  Filled 2021-08-04: qty 90, 90d supply, fill #0
  Filled 2021-08-04: qty 30, 30d supply, fill #0
  Filled 2021-11-05 (×2): qty 90, 90d supply, fill #1

## 2021-08-04 MED ORDER — ATORVASTATIN CALCIUM 10 MG PO TABS
10.0000 mg | ORAL_TABLET | Freq: Every day | ORAL | 1 refills | Status: DC
  Filled 2021-08-04 (×2): qty 30, 30d supply, fill #0

## 2021-08-04 MED ORDER — DEBROX 6.5 % OT SOLN
5.0000 [drp] | Freq: Two times a day (BID) | OTIC | 0 refills | Status: DC
  Filled 2021-08-04: qty 15, 30d supply, fill #0

## 2021-08-04 MED ORDER — CIPROFLOXACIN-DEXAMETHASONE 0.3-0.1 % OT SUSP
4.0000 [drp] | Freq: Two times a day (BID) | OTIC | 0 refills | Status: DC
  Filled 2021-08-04: qty 7.5, 19d supply, fill #0

## 2021-08-04 NOTE — Patient Instructions (Addendum)
Please obtain the orange card, once this is obtained we will refer you to orthopedics for your knee  A pneumonia vaccine was given  We washed out both ears.  Pick up the Debrox ear wax removal kit from our pharmacy to continue to apply drops to the ears and start ciprodex ear drops to both ears twice daily  A referral to ears nose throat was made Dr Melida Quitter  No change in medications  Return Dr Joya Gaskins 3 months  Labs today: cholesterol level and blood sugar test  Obtenga la tarjeta naranja, una vez que la [place], lo derivaremos a ortopedia para su rodilla.  Se administr una vacuna contra la neumona.  Lavamos ambas orejas.  Recoja el kit de eliminacin de cera del odo Debrox de nuestra farmacia para continuar aplicando gotas en los odos y comience a Midwife gotas para los odos de ciprodex en ambos odos dos veces al da.  Se hizo una referencia a odos nariz garganta Dr Melida Quitter Sin cambios en los medicamentos  Volver Dr. Joya Gaskins 3 meses  Laboratorios hoy: nivel de colesterol y prueba de azcar en la [place]    And s

## 2021-08-04 NOTE — Assessment & Plan Note (Signed)
Recheck lipid levels we will also check hemoglobin A1c and glucose

## 2021-08-04 NOTE — Assessment & Plan Note (Signed)
Blood pressure well controlled continue losartan 100 mg daily

## 2021-08-04 NOTE — Progress Notes (Signed)
Established Patient Office Visit  Subjective:  Patient ID: Claudia Hernandez, female    DOB: December 12, 1950  Age: 71 y.o. MRN: [id number]  CC:  Chief Complaint  Patient presents with   Ear Fullness   Hearing Problem    HPI Claudia Hernandez Claudia Hernandez presents for primary care follow-up and still having difficulty with fullness in the ears and difficulty hearing.  We try to lavage ears out in December unsuccessfully and gave her a course of Debrox but she did not ever fill this prescription.  She comes in today with similar complaints as in December.  She is also due a Prevnar 20 vaccine and she did agree to receive this.  This visit was assisted by Spanish and video interpreter [name] [phone]  Patient's been compliant with her blood pressure medicine and cholesterol medicine on arrival blood pressure is 121/79.  She is also here to see our financial counselor to get the orange card Patient is still having difficulty with left knee pain  Past Medical History:  Diagnosis Date   Arthritis    Asthma    hx of   Heart murmur    unsure of this, but was told she had one by MD in [place]   Hypertension     Past Surgical History:  Procedure Laterality Date   BLADDER SURGERY     CESAREAN SECTION     CHOLECYSTECTOMY     gallbladder removed     left foot surgery      Family History  Problem Relation Age of Onset   Drug abuse Neg Hx    Hypertension Neg Hx    Colon cancer Neg Hx    Esophageal cancer Neg Hx    Rectal cancer Neg Hx    Stomach cancer Neg Hx    Breast cancer Neg Hx     Social History   Socioeconomic History   Marital status: Single    Spouse name: Not on file   Number of children: 5   Years of education: Not on file   Highest education level: Bachelor's degree (e.g., BA, AB, BS)  Occupational History   Not on file  Tobacco Use   Smoking status: Never   Smokeless tobacco: Never  Vaping Use   Vaping Use: Never used  Substance and Sexual Activity    Alcohol use: Never   Drug use: Never   Sexual activity: Not Currently  Other Topics Concern   Not on file  Social History Narrative   Not on file   Social Determinants of Health   Financial Resource Strain: Not on file  Food Insecurity: Food Insecurity Present   Worried About [place] in the Last Year: Sometimes true   Ran Out of Food in the Last Year: Sometimes true  Transportation Needs: Unmet Transportation Needs   Lack of Transportation (Medical): Yes   Lack of Transportation (Non-Medical): Yes  Physical Activity: Not on file  Stress: Not on file  Social Connections: Not on file  Intimate Partner Violence: Not on file    Outpatient Medications Prior to Visit  Medication Sig Dispense Refill   atorvastatin (LIPITOR) 10 MG tablet Take 1 tablet (10 mg total) by mouth daily. 90 tablet 1   losartan (COZAAR) 100 MG tablet Take 1 tablet (100 mg total) by mouth daily. 90 tablet 2   ACETAMINOPHEN PO Take by mouth. PRN (Patient not taking: Reported on 08/04/2021)     COLLAGEN PO Take by mouth.  carbamide peroxide (DEBROX) 6.5 % OTIC solution Place 5 drops into both ears 2 (two) times daily. 15 mL 0   No facility-administered medications prior to visit.    No Known Allergies  ROS Review of Systems  Constitutional: Negative.   HENT:  Positive for ear pain and hearing loss. Negative for postnasal drip, rhinorrhea, sinus pressure, sore throat, trouble swallowing and voice change.   Eyes: Negative.   Respiratory: Negative.  Negative for apnea, cough, choking, chest tightness, shortness of breath, wheezing and stridor.   Cardiovascular: Negative.  Negative for chest pain, palpitations and leg swelling.  Gastrointestinal: Negative.  Negative for abdominal distention, abdominal pain, nausea and vomiting.  Genitourinary: Negative.   Musculoskeletal:  Negative for arthralgias and myalgias.       Left knee pain  Skin: Negative.  Negative for rash.  Allergic/Immunologic:  Negative.  Negative for environmental allergies and food allergies.  Neurological: Negative.  Negative for dizziness, syncope, weakness and headaches.  Hematological: Negative.  Negative for adenopathy. Does not bruise/bleed easily.  Psychiatric/Behavioral: Negative.  Negative for agitation and sleep disturbance. The patient is not nervous/anxious.      Objective:    Physical Exam Vitals reviewed.  Constitutional:      Appearance: Normal appearance. She is well-developed. She is not diaphoretic.  HENT:     Head: Normocephalic and atraumatic.     Right Ear: External ear normal. There is impacted cerumen.     Left Ear: External ear normal. There is impacted cerumen.     Ears:     Comments: Both external canals are erythematous consistent with external otitis    Nose: Nose normal. No nasal deformity, septal deviation, mucosal edema or rhinorrhea.     Right Sinus: No maxillary sinus tenderness or frontal sinus tenderness.     Left Sinus: No maxillary sinus tenderness or frontal sinus tenderness.     Mouth/Throat:     Mouth: Mucous membranes are moist.     Pharynx: Oropharynx is clear. No oropharyngeal exudate.  Eyes:     General: No scleral icterus.    Conjunctiva/sclera: Conjunctivae normal.     Pupils: Pupils are equal, round, and reactive to light.  Neck:     Thyroid: No thyromegaly.     Vascular: No carotid bruit or JVD.     Trachea: Trachea normal. No tracheal tenderness or tracheal deviation.  Cardiovascular:     Rate and Rhythm: Normal rate and regular rhythm.     Chest Wall: PMI is not displaced.     Pulses: Normal pulses. No decreased pulses.     Heart sounds: Normal heart sounds, S1 normal and S2 normal. Heart sounds not distant. No murmur heard. No systolic murmur is present.  No diastolic murmur is present.    No friction rub. No gallop. No S3 or S4 sounds.  Pulmonary:     Effort: Pulmonary effort is normal. No tachypnea, accessory muscle usage or respiratory  distress.     Breath sounds: Normal breath sounds. No stridor. No decreased breath sounds, wheezing, rhonchi or rales.  Chest:     Chest wall: No tenderness.  Abdominal:     General: Bowel sounds are normal. There is no distension.     Palpations: Abdomen is soft. Abdomen is not rigid.     Tenderness: There is no abdominal tenderness. There is no guarding or rebound.  Musculoskeletal:        General: Tenderness present. Normal range of motion.     Cervical back: Normal  range of motion and neck supple. No edema, erythema or rigidity. No muscular tenderness. Normal range of motion.     Comments: Left knee is tender laterally with some crepitation with range of motion  Lymphadenopathy:     Head:     Right side of head: No submental or submandibular adenopathy.     Left side of head: No submental or submandibular adenopathy.     Cervical: No cervical adenopathy.  Skin:    General: Skin is warm and dry.     Coloration: Skin is not pale.     Findings: No rash.     Nails: There is no clubbing.  Neurological:     Mental Status: She is alert and oriented to person, place, and time.     Sensory: No sensory deficit.  Psychiatric:        Mood and Affect: Mood normal.        Speech: Speech normal.        Behavior: Behavior normal.        Thought Content: Thought content normal.    BP 121/79    Pulse 63    Wt 129 lb 12.8 oz (58.9 kg)    SpO2 97%    BMI 26.22 kg/m  Wt Readings from Last 3 Encounters:  08/04/21 129 lb 12.8 oz (58.9 kg)  05/11/21 128 lb 6.4 oz (58.2 kg)  11/10/20 131 lb (59.4 kg)     Health Maintenance Due  Topic Date Due   COVID-19 Vaccine (4 - Booster) 10/14/2020   Pneumonia Vaccine 24+ Years old (2 - PPSV23 if available, else PCV20) 06/16/2021    There are no preventive care reminders to display for this patient.  Lab Results  Component Value Date   TSH 1.870 07/20/2019   Lab Results  Component Value Date   WBC 7.7 03/24/2021   HGB 13.5 03/24/2021   HCT  39.3 03/24/2021   MCV 106 (H) 03/24/2021   PLT 279 03/24/2021   Lab Results  Component Value Date   NA 141 03/24/2021   K 4.1 03/24/2021   CO2 24 03/24/2021   GLUCOSE 88 03/24/2021   BUN 11 03/24/2021   CREATININE 0.59 03/24/2021   BILITOT 0.5 03/24/2021   ALKPHOS 87 03/24/2021   AST 27 03/24/2021   ALT 22 03/24/2021   PROT 7.6 03/24/2021   ALBUMIN 4.8 03/24/2021   CALCIUM 9.5 03/24/2021   ANIONGAP 8 09/23/2020   EGFR 97 03/24/2021   Lab Results  Component Value Date   CHOL 175 03/24/2021   Lab Results  Component Value Date   HDL 42 03/24/2021   Lab Results  Component Value Date   LDLCALC 102 (H) 03/24/2021   Lab Results  Component Value Date   TRIG 175 (H) 03/24/2021   Lab Results  Component Value Date   CHOLHDL 4.2 03/24/2021   No results found for: HGBA1C    Assessment & Plan:   Problem List Items Addressed This Visit       Cardiovascular and Mediastinum   Essential hypertension    Blood pressure well controlled continue losartan 100 mg daily      Relevant Medications   losartan (COZAAR) 100 MG tablet   atorvastatin (LIPITOR) 10 MG tablet     Nervous and Auditory   Bilateral hearing loss due to cerumen impaction    Persistent hearing loss and cerumen impaction  We attempted to lavage both ears I was able to get some of the wax out of the  right ear but unable to remove the impaction which is sitting on top within the panic membranes in the left ear  Patient was in great deal of pain and could not tolerate the procedure in our primary care office  She will need a referral to otolaryngology for the earwax removal  I will give her Ciprodex eardrops and Debrox eardrops in the interim  She does not have insurance but she agrees to try to come up with the co-pay to go to see the otolaryngologist on a self-pay basis      Relevant Orders   Ambulatory referral to ENT   Chronic diffuse otitis externa of both ears    As per ear impaction  assessment      Relevant Orders   Ambulatory referral to ENT     Other   Elevated LDL cholesterol level - Primary    Recheck lipid levels we will also check hemoglobin A1c and glucose      Relevant Medications   atorvastatin (LIPITOR) 10 MG tablet   Other Relevant Orders   Lipid panel   Bilateral knee pain    Patient given the knee exercises again we will refer her to orthopedics when she gets the orange card      Other Visit Diagnoses     Hyperglycemia       Relevant Orders   Hemoglobin A1c   Glucose, Random       Meds ordered this encounter  Medications   losartan (COZAAR) 100 MG tablet    Sig: Take 1 tablet (100 mg total) by mouth daily.    Dispense:  90 tablet    Refill:  2   atorvastatin (LIPITOR) 10 MG tablet    Sig: Take 1 tablet (10 mg total) by mouth daily.    Dispense:  90 tablet    Refill:  1   carbamide peroxide (DEBROX) 6.5 % OTIC solution    Sig: Place 5 drops into both ears 2 (two) times daily.    Dispense:  15 mL    Refill:  0   ciprofloxacin-dexamethasone (CIPRODEX) OTIC suspension    Sig: Place 4 drops into both ears 2 (two) times daily.    Dispense:  7.5 mL    Refill:  0    Follow-up: Return in about 3 months (around 11/01/2021).    Asencion Noble, MD

## 2021-08-04 NOTE — Assessment & Plan Note (Signed)
Patient given the knee exercises again we will refer her to orthopedics when she gets the orange card

## 2021-08-04 NOTE — Assessment & Plan Note (Signed)
As per ear impaction assessment

## 2021-08-04 NOTE — Assessment & Plan Note (Signed)
Persistent hearing loss and cerumen impaction  We attempted to lavage both ears I was able to get some of the wax out of the right ear but unable to remove the impaction which is sitting on top within the panic membranes in the left ear  Patient was in great deal of pain and could not tolerate the procedure in our primary care office  She will need a referral to otolaryngology for the earwax removal  I will give her Ciprodex eardrops and Debrox eardrops in the interim  She does not have insurance but she agrees to try to come up with the co-pay to go to see the otolaryngologist on a self-pay basis

## 2021-08-05 LAB — LIPID PANEL
Chol/HDL Ratio: 3.6 ratio (ref 0.0–4.4)
Cholesterol, Total: 172 mg/dL (ref 100–199)
HDL: 48 mg/dL (ref >39)
LDL Chol Calc (NIH): 100 mg/dL — ABNORMAL HIGH (ref 0–99)
Triglycerides: 136 mg/dL (ref 0–149)
VLDL Cholesterol Cal: 24 mg/dL (ref 5–40)

## 2021-08-05 LAB — GLUCOSE, RANDOM: Glucose: 96 mg/dL (ref 70–99)

## 2021-08-05 LAB — HEMOGLOBIN A1C
Est. average glucose Bld gHb Est-mCnc: 103 mg/dL
Hgb A1c MFr Bld: 5.2 % (ref 4.8–5.6)

## 2021-08-06 ENCOUNTER — Telehealth: Payer: Self-pay

## 2021-08-06 NOTE — Telephone Encounter (Signed)
-----   Message from Storm Frisk, MD sent at 08/05/2021  5:54 AM EST ----- ?Let pt know cholesterol is slightly elevated,  no diabetes ? ?Stay on cholesterol medication as prescribed and follow low fat healthy diet ? ?

## 2021-08-06 NOTE — Telephone Encounter (Signed)
Pt was called and is aware of results, DOB was confirmed.  ? ?Interpreter ID# [phone] ?

## 2021-09-15 ENCOUNTER — Telehealth: Payer: Self-pay | Admitting: Critical Care Medicine

## 2021-09-15 NOTE — Telephone Encounter (Signed)
Copied from CRM [id number]. Topic: Referral - Question ?>> Sep 14, 2021  3:06 PM Pawlus, Claudia Hernandez wrote: ?Reason for CRM: Pt called in wanting to know if Hernandez referral could be sent in to an Ophthalmologist that accepts her insurance. Please advise. ?

## 2021-10-05 ENCOUNTER — Telehealth: Payer: Self-pay | Admitting: Critical Care Medicine

## 2021-10-05 NOTE — Telephone Encounter (Signed)
Pt is calling to schedule an appt to  renew her orange card.  ?CB- [phone] ?

## 2021-10-06 ENCOUNTER — Telehealth: Payer: Self-pay | Admitting: Critical Care Medicine

## 2021-10-06 NOTE — Telephone Encounter (Signed)
Patient was called and informed that she will need to come to the office to get an application and place it in the mail. ?

## 2021-10-06 NOTE — Telephone Encounter (Signed)
Called patient to inform her about the Financial Assistance Application nut did not get and answer and was unable to leave a a voice message. ?

## 2021-10-07 ENCOUNTER — Emergency Department (HOSPITAL_COMMUNITY): Payer: No Typology Code available for payment source

## 2021-10-07 ENCOUNTER — Ambulatory Visit: Payer: Self-pay | Admitting: *Deleted

## 2021-10-07 ENCOUNTER — Encounter (HOSPITAL_COMMUNITY): Payer: Self-pay

## 2021-10-07 ENCOUNTER — Emergency Department (HOSPITAL_COMMUNITY)
Admission: EM | Admit: 2021-10-07 | Discharge: 2021-10-08 | Disposition: A | Discharge status: Home / Self Care | Payer: No Typology Code available for payment source | Attending: Emergency Medicine | Admitting: Emergency Medicine

## 2021-10-07 DIAGNOSIS — R1110 Vomiting, unspecified: Secondary | ICD-10-CM | POA: Insufficient documentation

## 2021-10-07 DIAGNOSIS — R072 Precordial pain: Secondary | ICD-10-CM | POA: Insufficient documentation

## 2021-10-07 DIAGNOSIS — R079 Chest pain, unspecified: Secondary | ICD-10-CM

## 2021-10-07 DIAGNOSIS — R7989 Other specified abnormal findings of blood chemistry: Secondary | ICD-10-CM | POA: Insufficient documentation

## 2021-10-07 DIAGNOSIS — R0602 Shortness of breath: Secondary | ICD-10-CM | POA: Insufficient documentation

## 2021-10-07 DIAGNOSIS — I10 Essential (primary) hypertension: Secondary | ICD-10-CM | POA: Insufficient documentation

## 2021-10-07 DIAGNOSIS — R1011 Right upper quadrant pain: Secondary | ICD-10-CM | POA: Insufficient documentation

## 2021-10-07 DIAGNOSIS — Z79899 Other long term (current) drug therapy: Secondary | ICD-10-CM | POA: Insufficient documentation

## 2021-10-07 LAB — COMPREHENSIVE METABOLIC PANEL
ALT: 115 U/L — ABNORMAL HIGH (ref 0–44)
AST: 161 U/L — ABNORMAL HIGH (ref 15–41)
Albumin: 4.2 g/dL (ref 3.5–5.0)
Alkaline Phosphatase: 101 U/L (ref 38–126)
Anion gap: 8 (ref 5–15)
BUN: 14 mg/dL (ref 8–23)
CO2: 24 mmol/L (ref 22–32)
Calcium: 9.6 mg/dL (ref 8.9–10.3)
Chloride: 107 mmol/L (ref 98–111)
Creatinine, Ser: 0.57 mg/dL (ref 0.44–1.00)
GFR, Estimated: >60 mL/min (ref >60)
Glucose, Bld: 103 mg/dL — ABNORMAL HIGH (ref 70–99)
Potassium: 4 mmol/L (ref 3.5–5.1)
Sodium: 139 mmol/L (ref 135–145)
Total Bilirubin: 0.9 mg/dL (ref 0.3–1.2)
Total Protein: 7.4 g/dL (ref 6.5–8.1)

## 2021-10-07 LAB — URINALYSIS, ROUTINE W REFLEX MICROSCOPIC
Bilirubin Urine: NEGATIVE
Glucose, UA: NEGATIVE mg/dL
Hgb urine dipstick: NEGATIVE
Ketones, ur: NEGATIVE mg/dL
Leukocytes,Ua: NEGATIVE
Nitrite: NEGATIVE
Protein, ur: NEGATIVE mg/dL
Specific Gravity, Urine: 1.005 (ref 1.005–1.030)
pH: 8 (ref 5.0–8.0)

## 2021-10-07 LAB — CBC
HCT: 41.7 % (ref 36.0–46.0)
Hemoglobin: 14.5 g/dL (ref 12.0–15.0)
MCH: 36.3 pg — ABNORMAL HIGH (ref 26.0–34.0)
MCHC: 34.8 g/dL (ref 30.0–36.0)
MCV: 104.3 fL — ABNORMAL HIGH (ref 80.0–100.0)
Platelets: 252 10*3/uL (ref 150–400)
RBC: 4 MIL/uL (ref 3.87–5.11)
RDW: 12.5 % (ref 11.5–15.5)
WBC: 10.9 10*3/uL — ABNORMAL HIGH (ref 4.0–10.5)
nRBC: 0 % (ref 0.0–0.2)

## 2021-10-07 LAB — TROPONIN I (HIGH SENSITIVITY)
Troponin I (High Sensitivity): 3 ng/L (ref <18)
Troponin I (High Sensitivity): 3 ng/L (ref <18)

## 2021-10-07 LAB — LIPASE, BLOOD: Lipase: 39 U/L (ref 11–51)

## 2021-10-07 MED ORDER — ONDANSETRON HCL 4 MG/2ML IJ SOLN
4.0000 mg | Freq: Once | INTRAMUSCULAR | Status: AC
  Administered 2021-10-07: 4 mg via INTRAVENOUS
  Filled 2021-10-07: qty 2

## 2021-10-07 MED ORDER — ESOMEPRAZOLE MAGNESIUM 40 MG PO CPDR: 40.0000 mg | DELAYED_RELEASE_CAPSULE | Freq: Every day | ORAL | 0 refills | Status: DC

## 2021-10-07 MED ORDER — SODIUM CHLORIDE 0.9 % IV BOLUS
1000.0000 mL | Freq: Once | INTRAVENOUS | Status: AC
Start: 2021-10-07 — End: 2021-10-08
  Administered 2021-10-07: 1000 mL via INTRAVENOUS

## 2021-10-07 MED ORDER — DICYCLOMINE HCL 20 MG PO TABS: 20.0000 mg | ORAL_TABLET | Freq: Two times a day (BID) | ORAL | 0 refills | Status: DC

## 2021-10-07 MED ORDER — MORPHINE SULFATE (PF) 4 MG/ML IV SOLN
4.0000 mg | Freq: Once | INTRAVENOUS | Status: AC
  Administered 2021-10-07: 4 mg via INTRAVENOUS
  Filled 2021-10-07: qty 1

## 2021-10-07 MED ORDER — FAMOTIDINE IN NACL 20-0.9 MG/50ML-% IV SOLN
20.0000 mg | Freq: Once | INTRAVENOUS | Status: AC
  Administered 2021-10-07: 20 mg via INTRAVENOUS
  Filled 2021-10-07: qty 50

## 2021-10-07 MED ORDER — ONDANSETRON HCL 4 MG PO TABS: 4.0000 mg | ORAL_TABLET | Freq: Four times a day (QID) | ORAL | 0 refills | Status: DC

## 2021-10-07 MED ORDER — KETOROLAC TROMETHAMINE 30 MG/ML IJ SOLN
30.0000 mg | Freq: Once | INTRAMUSCULAR | Status: AC
  Administered 2021-10-07: 30 mg via INTRAVENOUS
  Filled 2021-10-07: qty 1

## 2021-10-07 MED ORDER — HYDROMORPHONE HCL 1 MG/ML IJ SOLN
1.0000 mg | Freq: Once | INTRAMUSCULAR | Status: DC
  Filled 2021-10-07: qty 1

## 2021-10-07 MED ORDER — IOHEXOL 300 MG/ML  SOLN
100.0000 mL | Freq: Once | INTRAMUSCULAR | Status: AC | PRN
  Administered 2021-10-07: 100 mL via INTRAVENOUS

## 2021-10-07 NOTE — ED Provider Triage Note (Signed)
Emergency Medicine Provider Triage Evaluation Note ? ?Claudia Hernandez , a 71 y.o. female  was evaluated in triage.  Pt complains of chest pain, back pain, nausea, vomiting.  Patient states that the pain began this morning at approximately 830.  Patient suffered back pain at the same time.  States that the chest pain was rated as high as a 10 out of 10 in pain.  Patient denies taking medications.  Patient began to have cold sweats at the time of the chest pain and had nausea and did vomit 4 times.  Patient does have history of asthma.  Endorses shortness of breath during the episodes.  Denies loss of consciousness. ? ?Review of Systems  ?Positive: Chest pain, nausea, vomiting, shortness of breath, back pain ?Negative: Syncope ? ?Physical Exam  ?BP 134/85   Pulse 70   Temp 98.5 ?F (36.9 ?C) (Oral)   Resp 17   SpO2 97%  ?Gen:   Awake, no distress   ?Resp:  Normal effort  ?MSK:   Moves extremities without difficulty  ?Other:   ? ?Medical Decision Making  ?Medically screening exam initiated at 2:41 PM.  Appropriate orders placed.  [facility] Robb Matar was informed that the remainder of the evaluation will be completed by another provider, this initial triage assessment does not replace that evaluation, and the importance of remaining in the ED until their evaluation is complete. ? ?Interpreter services used throughout encounter ?  ?Darrick Grinder, PA-C ?10/07/21 1444 ? ?

## 2021-10-07 NOTE — ED Triage Notes (Signed)
Pt c/o midsternal CP that started at 0800. Pt reports that she had multiple episodes of emesis after that. Pt endorses SOB.  ?

## 2021-10-07 NOTE — ED Provider Notes (Signed)
?[facility] EMERGENCY DEPARTMENT ?Provider Note ? ? ?CSN: [id number] ?Arrival date & time: 10/07/21  1408 ? ?  ? ?History ? ?Chief Complaint  ?Patient presents with  ? Chest Pain  ? Emesis  ? ? ?Claudia Hernandez is a 71 y.o. female hx of hypertension here presenting with chest pain and abdominal pain.  Patient states that this morning she had some substernal chest pain.  She states that she also is associated with some shortness of breath and vomiting.  Patient also has some right-sided abdominal pain and back pain as well.  Patient states that she had a cholecystectomy already.  Denies any fevers.  ? ?The history is provided by the patient.  ? ?  ? ?Home Medications ?Prior to Admission medications   ?Medication Sig Start Date End Date Taking? Authorizing Provider  ?ACETAMINOPHEN PO Take by mouth. PRN ?Patient not taking: Reported on 08/04/2021    [provider]  ?atorvastatin (LIPITOR) 10 MG tablet Take 1 tablet (10 mg total) by mouth daily. 08/04/21 08/04/22  Storm Frisk, MD  ?carbamide peroxide (DEBROX) 6.5 % OTIC solution Place 5 drops into both ears 2 (two) times daily. 08/04/21   Storm Frisk, MD  ?ciprofloxacin-dexamethasone (CIPRODEX) OTIC suspension Place 4 drops into both ears 2 (two) times daily. 08/04/21   Storm Frisk, MD  ?COLLAGEN PO Take by mouth.    [provider]  ?losartan (COZAAR) 100 MG tablet Take 1 tablet (100 mg total) by mouth daily. 08/04/21 08/04/22  Storm Frisk, MD  ?   ? ?Allergies    ?Patient has no known allergies.   ? ?Review of Systems   ?Review of Systems  ?Cardiovascular:  Positive for chest pain.  ?Gastrointestinal:  Positive for vomiting.  ?All other systems reviewed and are negative. ? ?Physical Exam ?Updated Vital Signs ?BP (!) 146/82   Pulse 71   Temp 98 ?F (36.7 ?C) (Oral)   Resp 18   Ht 4' 11.06" (1.5 m)   Wt 62 kg   SpO2 99%   BMI 27.56 kg/m?  ?Physical Exam ?Vitals and nursing note reviewed.   ?Constitutional:   ?   Appearance: She is well-developed.  ?HENT:  ?   Head: Normocephalic.  ?Eyes:  ?   Extraocular Movements: Extraocular movements intact.  ?   Pupils: Pupils are equal, round, and reactive to light.  ?Cardiovascular:  ?   Rate and Rhythm: Normal rate and regular rhythm.  ?   Heart sounds: Normal heart sounds.  ?Pulmonary:  ?   Effort: Pulmonary effort is normal.  ?   Breath sounds: Normal breath sounds.  ?Abdominal:  ?   General: Bowel sounds are normal.  ?   Comments: Right upper quadrant tenderness  ?Musculoskeletal:     ?   General: Normal range of motion.  ?   Cervical back: Normal range of motion and neck supple.  ?Skin: ?   General: Skin is warm.  ?Neurological:  ?   General: No focal deficit present.  ?   Mental Status: She is alert and oriented to person, place, and time.  ?Psychiatric:     ?   Mood and Affect: Mood normal.     ?   Behavior: Behavior normal.  ? ? ?ED Results / Procedures / Treatments   ?Labs ?(all labs ordered are listed, but only abnormal results are displayed) ?Labs Reviewed  ?CBC - Abnormal; Notable for the following components:  ?  Result Value  ? WBC 10.9 (*)   ? MCV 104.3 (*)   ? MCH 36.3 (*)   ? All other components within normal limits  ?COMPREHENSIVE METABOLIC PANEL - Abnormal; Notable for the following components:  ? Glucose, Bld 103 (*)   ? AST 161 (*)   ? ALT 115 (*)   ? All other components within normal limits  ?URINALYSIS, ROUTINE W REFLEX MICROSCOPIC - Abnormal; Notable for the following components:  ? Color, Urine STRAW (*)   ? All other components within normal limits  ?LIPASE, BLOOD  ?HEPATITIS PANEL, ACUTE  ?TROPONIN I (HIGH SENSITIVITY)  ?TROPONIN I (HIGH SENSITIVITY)  ? ? ?EKG ?EKG Interpretation ? ?Date/Time:  Wednesday Oct 07 2021 14:28:30 EDT ?Ventricular Rate:  69 ?PR Interval:  154 ?QRS Duration: 84 ?QT Interval:  448 ?QTC Calculation: 480 ?R Axis:   9 ?Text Interpretation: Normal sinus rhythm Normal ECG When compared with ECG of 23-Sep-2020  17:08, No significant change since last tracing Confirmed by Richardean Canal [phone]) on 10/07/2021 8:59:52 PM ? ?Radiology ?DG Chest 1 View ? ?Result Date: 10/07/2021 ?CLINICAL DATA:  Chest pain EXAM: CHEST  1 VIEW COMPARISON:  04/12/2020 FINDINGS: Cardiac size is within normal limits. There are no signs of pulmonary edema or focal pulmonary consolidation. There is no pleural effusion or pneumothorax. IMPRESSION: No active disease. Electronically Signed   By: Ernie Avena M.D.   On: 10/07/2021 15:17   ? ?Procedures ?Procedures  ? ? ?Medications Ordered in ED ?Medications  ?sodium chloride 0.9 % bolus 1,000 mL (has no administration in time range)  ?ondansetron (ZOFRAN) injection 4 mg (has no administration in time range)  ?morphine (PF) 4 MG/ML injection 4 mg (has no administration in time range)  ? ? ?ED Course/ Medical Decision Making/ A&P ?  ?                        ?Medical Decision Making ?Claudia Hernandez is a 71 y.o. female here presenting with abdominal pain and also chest pain.  Chest pain likely reflux from vomiting, low suspicion for ACS and I doubt PE.  Abdominal pain can be from SBO versus hepatitis versus pancreatitis.  Plan to get CBC and CMP and lipase and UA and CT ab/pel.  ? ?11:46 PM ?LFTs elevated. Hepatitis panel sent. CT showed hiatal hernia. Pain improved. Will dc home with pepcid, nexium, GI follow up.  ? ?Problems Addressed: ?Chest pain, unspecified type: acute illness or injury ?Elevated LFTs: acute illness or injury ?RUQ pain: acute illness or injury ? ?Amount and/or Complexity of Data Reviewed ?Labs: ordered. Decision-making details documented in ED Course. ?Radiology: ordered and independent interpretation performed. Decision-making details documented in ED Course. ?ECG/medicine tests: ordered and independent interpretation performed. Decision-making details documented in ED Course. ? ?Risk ?Prescription drug management. ? ? ? ?Final Clinical Impression(s) / ED  Diagnoses ?Final diagnoses:  ?None  ? ? ?Rx / DC Orders ?ED Discharge Orders   ? ? None  ? ?  ? ? ?  ?Charlynne Pander, MD ?10/07/21 2347 ? ?

## 2021-10-07 NOTE — Discharge Instructions (Signed)
Your liver function test is elevated. Avoid taking too much tylenol. You can take motrin for pain  ? ?Take zofran for nausea  ? ?Take nexium daily.  ? ?You need to see GI doctor to repeat liver function test and possibly get endoscopy  ? ?Take bentyl for cramps  ? ?Return to ER if you have worse abdominal pain, chest pain, vomiting  ?

## 2021-10-07 NOTE — Telephone Encounter (Signed)
?  Chief Complaint: Pt having "strong chest pain" between her breasts and shooting into her back.  Also vomited 3 times since this morning.    ?Symptoms: above ?Frequency: Chest pain now.   Vomited this morning ?Pertinent Negatives: Patient denies cardiac history ?Disposition: [x] ED /[] Urgent Care (no appt availability in office) / [] Appointment(In office/virtual)/ []  [place] [facility]/ [] Home Care/ [] Refused Recommended Disposition /[] [place] [facility]/ []  Follow-up with PCP ?Additional Notes: Instructed to call 911.   They will use [facility].    Message sent to [facility] high priority for Dr. Wright's information.   I also sent a Teams message to [facility] flow coordinator. ?

## 2021-10-07 NOTE — Telephone Encounter (Signed)
Reason for Disposition ? [1] Chest pain lasts > 5 minutes AND [2] age > 31 ?   Vomiting too ? ?Answer Assessment - Initial Assessment Questions ?1. VOMITING SEVERITY: "How many times have you vomited in the past 24 hours?"  ?   - MILD:  1 - 2 times/day ?   - MODERATE: 3 - 5 times/day, decreased oral intake without significant weight loss or symptoms of dehydration ?   - SEVERE: 6 or more times/day, vomits everything or nearly everything, with significant weight loss, symptoms of dehydration  ?    Son calling in.  Claudia Hernandez.  ?She started vomiting this morning.   3 times.   ?2. ONSET: "When did the vomiting begin?"  ?    This morning ? ?She is having strong chest pain.   Son called in using Spanish interpreter.   ?3. FLUIDS: "What fluids or food have you vomited up today?" "Have you been able to keep any fluids down?" ?    *No Answer* ?4. ABDOMINAL PAIN: "Are your having any abdominal pain?" If yes : "How bad is it and what does it feel like?" (e.g., crampy, dull, intermittent, constant)  ?    *No Answer* ?5. DIARRHEA: "Is there any diarrhea?" If Yes, ask: "How many times today?"  ?    *No Answer* ?6. CONTACTS: "Is there anyone else in the family with the same symptoms?"  ?    *No Answer* ?7. CAUSE: "What do you think is causing your vomiting?" ?    *No Answer* ?8. HYDRATION STATUS: "Any signs of dehydration?" (e.g., dry mouth [not only dry lips], too weak to stand) "When did you last urinate?" ?    *No Answer* ?9. OTHER SYMPTOMS: "Do you have any other symptoms?" (e.g., fever, headache, vertigo, vomiting blood or coffee grounds, recent head injury) ?    *No Answer* ?10. PREGNANCY: "Is there any chance you are pregnant?" "When was your last menstrual period?" ?      *No Answer* ? ?Answer Assessment - Initial Assessment Questions ?1. LOCATION: "Where does it hurt?"   ?    She is vomiting that starting this morning.   Son Claudia Hernandez calling in using Spanish interpreter.  Claudia Hernandez #[id number].   She is also having  strong chest pain.    The pain is between her breasts and the pain is shooting into her back.    I instructed him to call 911 at this point which he was agreeable to doing.  No further questions.   They are going to [facility].  ?2. RADIATION: "Does the pain go anywhere else?" (e.g., into neck, jaw, arms, back) ?    *No Answer* ?3. ONSET: "When did the chest pain begin?" (Minutes, hours or days)  ?    *No Answer* ?4. PATTERN "Does the pain come and go, or has it been constant since it started?"  "Does it get worse with exertion?"  ?    *No Answer* ?5. DURATION: "How long does it last" (e.g., seconds, minutes, hours) ?    *No Answer* ?6. SEVERITY: "How bad is the pain?"  (e.g., Scale 1-10; mild, moderate, or severe) ?   - MILD (1-3): doesn't interfere with normal activities  ?   - MODERATE (4-7): interferes with normal activities or awakens from sleep ?   - SEVERE (8-10): excruciating pain, unable to do any normal activities   ?    *No Answer* ?7. CARDIAC RISK FACTORS: "Do you have any history of heart  problems or risk factors for heart disease?" (e.g., angina, prior heart attack; diabetes, high blood pressure, high cholesterol, smoker, or strong family history of heart disease) ?    *No Answer* ?8. PULMONARY RISK FACTORS: "Do you have any history of lung disease?"  (e.g., blood clots in lung, asthma, emphysema, birth control pills) ?    *No Answer* ?9. CAUSE: "What do you think is causing the chest pain?" ?    *No Answer* ?10. OTHER SYMPTOMS: "Do you have any other symptoms?" (e.g., dizziness, nausea, vomiting, sweating, fever, difficulty breathing, cough) ?      *No Answer* ?11. PREGNANCY: "Is there any chance you are pregnant?" "When was your last menstrual period?" ?      *No Answer* ? ?Protocols used: Vomiting-A-AH, Chest Pain-A-AH ? ?

## 2021-10-08 ENCOUNTER — Ambulatory Visit: Payer: No Typology Code available for payment source | Admitting: Critical Care Medicine

## 2021-10-08 LAB — HEPATITIS PANEL, ACUTE
HCV Ab: NONREACTIVE
Hep A IgM: NONREACTIVE
Hep B C IgM: NONREACTIVE
Hepatitis B Surface Ag: NONREACTIVE

## 2021-10-27 ENCOUNTER — Other Ambulatory Visit: Payer: Self-pay

## 2021-11-05 ENCOUNTER — Other Ambulatory Visit: Payer: Self-pay

## 2021-11-05 ENCOUNTER — Ambulatory Visit: Payer: Self-pay | Attending: Physician Assistant | Admitting: Physician Assistant

## 2021-11-05 ENCOUNTER — Encounter: Payer: Self-pay | Admitting: Physician Assistant

## 2021-11-05 ENCOUNTER — Telehealth: Payer: Self-pay | Admitting: Critical Care Medicine

## 2021-11-05 VITALS — BP 113/79 | HR 71 | Wt 135.4 lb

## 2021-11-05 DIAGNOSIS — R7989 Other specified abnormal findings of blood chemistry: Secondary | ICD-10-CM

## 2021-11-05 DIAGNOSIS — Z789 Other specified health status: Secondary | ICD-10-CM

## 2021-11-05 DIAGNOSIS — J9801 Acute bronchospasm: Secondary | ICD-10-CM

## 2021-11-05 DIAGNOSIS — Z09 Encounter for follow-up examination after completed treatment for conditions other than malignant neoplasm: Secondary | ICD-10-CM

## 2021-11-05 DIAGNOSIS — H6123 Impacted cerumen, bilateral: Secondary | ICD-10-CM

## 2021-11-05 MED ORDER — ALBUTEROL SULFATE HFA 108 (90 BASE) MCG/ACT IN AERS
2.0000 | INHALATION_SPRAY | Freq: Four times a day (QID) | RESPIRATORY_TRACT | 2 refills | Status: DC | PRN
  Filled 2021-11-05: qty 18, 25d supply, fill #0

## 2021-11-05 MED ORDER — MONTELUKAST SODIUM 10 MG PO TABS
10.0000 mg | ORAL_TABLET | Freq: Every day | ORAL | 3 refills | Status: DC
Start: 2021-11-05 — End: 2022-02-18
  Filled 2021-11-05: qty 30, 30d supply, fill #0

## 2021-11-05 MED ORDER — DEBROX 6.5 % OT SOLN
5.0000 [drp] | Freq: Once | OTIC | 0 refills | Status: AC
  Filled 2021-11-05: qty 15, 60d supply, fill #0

## 2021-11-05 NOTE — Progress Notes (Signed)
Patient ID: Claudia Hernandez, female   DOB: 08-20-1950, 71 y.o.   MRN: [id number]     Claudia Hernandez, is a 71 y.o. female  MCN:[id number]  OQH:[id number]  DOB - 06-29-1950  Chief Complaint  Patient presents with   Hospitalization Follow-up       Subjective:   Claudia Hernandez is a 71 y.o. female here today for a follow up visit  After ED visit 10/07/2021 for chest and abdominal pain with increased LFT.  She says that it ended up being a stomach virus that went through her whole house.  All of her symptoms from that have resolved.    She c/o cerumen impaction and wheezing.  She has been using her sons albuterol inhaler which helps.    From ED note: Medical Decision Making [facility] Robb Matar is a 71 y.o. female here presenting with abdominal pain and also chest pain.  Chest pain likely reflux from vomiting, low suspicion for ACS and I doubt PE.  Abdominal pain can be from SBO versus hepatitis versus pancreatitis.  Plan to get CBC and CMP and lipase and UA and CT ab/pel.    11:46 PM LFTs elevated. Hepatitis panel sent. CT showed hiatal hernia. Pain improved. Will dc home with pepcid, nexium, GI follow up.    Problems Addressed: Chest pain, unspecified type: acute illness or injury Elevated LFTs: acute illness or injury RUQ pain: acute illness or injury   Amount and/or Complexity of Data Reviewed Labs: ordered. Decision-making details documented in ED Course. Radiology: ordered and independent interpretation performed. Decision-making details documented in ED Course. ECG/medicine tests: ordered and independent interpretation performed. Decision-making details documented in ED Course.  No problems updated.  ALLERGIES: No Known Allergies  PAST MEDICAL HISTORY: Past Medical History:  Diagnosis Date   Arthritis    Asthma    hx of   Heart murmur    unsure of this, but was told she had one by MD in [place]   Hypertension     MEDICATIONS AT  HOME: Prior to Admission medications   Medication Sig Start Date End Date Taking? Authorizing Provider  albuterol (VENTOLIN HFA) 108 (90 Base) MCG/ACT inhaler Inhale 2 puffs into the lungs every 6 (six) hours as needed for wheezing or shortness of breath. 11/05/21  Yes Uriel Dowding, Marzella Schlein, PA-C  carbamide peroxide (DEBROX) 6.5 % OTIC solution Place 5 drops into both ears once for 1 dose. The night before your ear irrigation and lab appt 11/05/21 11/05/21 Yes Anders Simmonds, PA-C  ciprofloxacin-dexamethasone (CIPRODEX) OTIC suspension Place 4 drops into both ears 2 (two) times daily. 08/04/21  Yes Storm Frisk, MD  COLLAGEN PO Take by mouth.   Yes [provider]  dicyclomine (BENTYL) 20 MG tablet Take 1 tablet (20 mg total) by mouth 2 (two) times daily. 10/07/21  Yes Charlynne Pander, MD  esomeprazole (NEXIUM) 40 MG capsule Take 1 capsule (40 mg total) by mouth daily. 10/07/21  Yes Charlynne Pander, MD  losartan (COZAAR) 100 MG tablet Take 1 tablet (100 mg total) by mouth daily. 08/04/21 08/04/22 Yes Storm Frisk, MD  montelukast (SINGULAIR) 10 MG tablet Take 1 tablet (10 mg total) by mouth at bedtime. For wheezing 11/05/21  Yes Faithann Natal M, PA-C  ondansetron (ZOFRAN) 4 MG tablet Take 1 tablet (4 mg total) by mouth every 6 (six) hours. 10/07/21  Yes Charlynne Pander, MD  ACETAMINOPHEN PO Take by mouth. PRN Patient not taking: Reported on 08/04/2021  [provider]  atorvastatin (LIPITOR) 10 MG tablet Take 1 tablet (10 mg total) by mouth daily. Patient not taking: Reported on 11/05/2021 08/04/21 08/04/22  Storm Frisk, MD    ROS: Neg cardiac Neg GI Neg GU Neg MS Neg psych Neg neuro  Objective:   Vitals:   11/05/21 1638  BP: 113/79  Pulse: 71  SpO2: 95%  Weight: 135 lb 6.4 oz (61.4 kg)   Exam General appearance : Awake, alert, not in any distress. Speech Clear. Not toxic looking HEENT: Atraumatic and Normocephalic Neck: Supple, no JVD. No cervical  lymphadenopathy.  Chest: Good air entry bilaterally, CTAB.  No rales/rhonchi. Mild expiratory wheezing CVS: S1 S2 regular, no murmurs.  Extremities: B/L Lower Ext shows no edema, both legs are warm to touch Neurology: Awake alert, and oriented X 3, CN II-XII intact, Non focal Skin: No Rash  Data Review Lab Results  Component Value Date   HGBA1C 5.2 08/04/2021    Assessment & Plan   1. Elevated LFTs Likely resolved - Comprehensive metabolic panel; Future  2. Bronchospasm - montelukast (SINGULAIR) 10 MG tablet; Take 1 tablet (10 mg total) by mouth at bedtime. For wheezing  Dispense: 30 tablet; Refill: 3 - albuterol (VENTOLIN HFA) 108 (90 Base) MCG/ACT inhaler; Inhale 2 puffs into the lungs every 6 (six) hours as needed for wheezing or shortness of breath.  Dispense: 18 g; Refill: 2  3. Bilateral impacted cerumen - carbamide peroxide (DEBROX) 6.5 % OTIC solution; Place 5 drops into both ears once for 1 dose. The night before your ear irrigation and lab appt  Dispense: 15 mL; Refill: 0  4. Language barrier [employer] "Jari Favre" interpreters used and additional time performing visit was required.  5. Encounter for examination following treatment at hospital  Return for 1 week for labs and ear irrigation (nurse visit).  The patient was given clear instructions to go to ER or return to medical center if symptoms don't improve, worsen or new problems develop. The patient verbalized understanding. The patient was told to call to get lab results if they haven't heard anything in the next week.      Georgian Co, PA-C [facility] [place], [place] [phone]   11/05/2021, 4:55 PM

## 2021-11-05 NOTE — Patient Instructions (Signed)
Use ear drops the night before your next appt

## 2021-11-13 ENCOUNTER — Ambulatory Visit: Payer: No Typology Code available for payment source

## 2021-12-03 ENCOUNTER — Encounter: Payer: Self-pay | Admitting: Emergency Medicine

## 2021-12-10 ENCOUNTER — Telehealth: Payer: Self-pay

## 2021-12-10 NOTE — Telephone Encounter (Signed)
Copied from CRM [id number]. Topic: General - Other >> Dec 10, 2021  4:43 PM Ja-Kwan M wrote: Reason for CRM: Pt stated she submitted the paperwork for financial assistance but she has not heard back from anyone. Pt requests return call. Cb# [phone] Pt requests Spanish interpreter

## 2022-01-21 ENCOUNTER — Encounter (HOSPITAL_COMMUNITY): Payer: Self-pay | Admitting: *Deleted

## 2022-01-21 ENCOUNTER — Ambulatory Visit: Payer: Self-pay | Admitting: *Deleted

## 2022-01-21 ENCOUNTER — Ambulatory Visit (HOSPITAL_COMMUNITY)
Admission: EM | Admit: 2022-01-21 | Discharge: 2022-01-21 | Disposition: A | Discharge status: Home / Self Care | Payer: No Typology Code available for payment source | Attending: Family Medicine | Admitting: Family Medicine

## 2022-01-21 ENCOUNTER — Other Ambulatory Visit: Payer: Self-pay

## 2022-01-21 DIAGNOSIS — R197 Diarrhea, unspecified: Secondary | ICD-10-CM | POA: Insufficient documentation

## 2022-01-21 DIAGNOSIS — R1033 Periumbilical pain: Secondary | ICD-10-CM | POA: Insufficient documentation

## 2022-01-21 LAB — POCT URINALYSIS DIPSTICK, ED / UC
Bilirubin Urine: NEGATIVE
Glucose, UA: NEGATIVE mg/dL
Ketones, ur: NEGATIVE mg/dL
Leukocytes,Ua: NEGATIVE
Nitrite: NEGATIVE
Protein, ur: NEGATIVE mg/dL
Specific Gravity, Urine: 1.01 (ref 1.005–1.030)
Urobilinogen, UA: 0.2 mg/dL (ref 0.0–1.0)
pH: 5.5 (ref 5.0–8.0)

## 2022-01-21 LAB — CBC
HCT: 40.6 % (ref 36.0–46.0)
Hemoglobin: 14 g/dL (ref 12.0–15.0)
MCH: 36.2 pg — ABNORMAL HIGH (ref 26.0–34.0)
MCHC: 34.5 g/dL (ref 30.0–36.0)
MCV: 104.9 fL — ABNORMAL HIGH (ref 80.0–100.0)
Platelets: 246 10*3/uL (ref 150–400)
RBC: 3.87 MIL/uL (ref 3.87–5.11)
RDW: 14 % (ref 11.5–15.5)
WBC: 5.9 10*3/uL (ref 4.0–10.5)
nRBC: 0 % (ref 0.0–0.2)

## 2022-01-21 LAB — COMPREHENSIVE METABOLIC PANEL
ALT: 56 U/L — ABNORMAL HIGH (ref 0–44)
AST: 56 U/L — ABNORMAL HIGH (ref 15–41)
Albumin: 4.1 g/dL (ref 3.5–5.0)
Alkaline Phosphatase: 73 U/L (ref 38–126)
Anion gap: 7 (ref 5–15)
BUN: 10 mg/dL (ref 8–23)
CO2: 24 mmol/L (ref 22–32)
Calcium: 8.8 mg/dL — ABNORMAL LOW (ref 8.9–10.3)
Chloride: 106 mmol/L (ref 98–111)
Creatinine, Ser: 0.58 mg/dL (ref 0.44–1.00)
GFR, Estimated: >60 mL/min (ref >60)
Glucose, Bld: 88 mg/dL (ref 70–99)
Potassium: 3.5 mmol/L (ref 3.5–5.1)
Sodium: 137 mmol/L (ref 135–145)
Total Bilirubin: 0.5 mg/dL (ref 0.3–1.2)
Total Protein: 7.1 g/dL (ref 6.5–8.1)

## 2022-01-21 MED ORDER — DIPHENOXYLATE-ATROPINE 2.5-0.025 MG PO TABS
1.0000 | ORAL_TABLET | Freq: Four times a day (QID) | ORAL | 0 refills | Status: DC | PRN
  Filled 2022-01-21: qty 8, 2d supply, fill #0

## 2022-01-21 NOTE — Telephone Encounter (Signed)
Reason for Disposition  [1] MILD-MODERATE pain AND [2] constant AND [3] present > 2 hours  Answer Assessment - Initial Assessment Questions 1. LOCATION: "Where does it hurt?"      Pt calling in c/o stomach pain since Monday?   It hurt and had vomiting and diarrhea   I went to pharmacy and got something.   I got better then the pain got worse.Tues. it was better then Wed. Got worse and today it is worse. 2. RADIATION: "Does the pain shoot anywhere else?" (e.g., chest, back)     All my stomach hurts. 3. ONSET: "When did the pain begin?" (e.g., minutes, hours or days ago)      Monday Not vomiting now but have diarrhea.  Only helped for a day the medicine. 4. SUDDEN: "Gradual or sudden onset?"     *No Answer* 5. PATTERN "Does the pain come and go, or is it constant?"    - If it comes and goes: "How long does it last?" "Do you have pain now?"     (Note: Comes and goes means the pain is intermittent. It goes away completely between bouts.)    - If constant: "Is it getting better, staying the same, or getting worse?"      (Note: Constant means the pain never goes away completely; most serious pain is constant and gets worse.)      Constant 6. SEVERITY: "How bad is the pain?"  (e.g., Scale 1-10; mild, moderate, or severe)    - MILD (1-3): Doesn't interfere with normal activities, abdomen soft and not tender to touch.     - MODERATE (4-7): Interferes with normal activities or awakens from sleep, abdomen tender to touch.     - SEVERE (8-10): Excruciating pain, doubled over, unable to do any normal activities.       Moderate pain 7. RECURRENT SYMPTOM: "Have you ever had this type of stomach pain before?" If Yes, ask: "When was the last time?" and "What happened that time?"      Not asked 8. CAUSE: "What do you think is causing the stomach pain?"     Not asked 9. RELIEVING/AGGRAVATING FACTORS: "What makes it better or worse?" (e.g., antacids, bending or twisting motion, bowel movement)     Not  asked 10. OTHER SYMPTOMS: "Do you have any other symptoms?" (e.g., back pain, diarrhea, fever, urination pain, vomiting)       Stomach pain and diarrhea now.   No vomiting.   I went to hospital 2 months ago with same thing.   They checked me.   They gave me morphine and I almost died.   I don't want to go to the hospital again because of that.    She did not know what an urgent care was so I explained it to it. I gave her the address and information for the [facility] at [facility].    She was agreeable to going there since there are no appts available for several weeks at [facility] and the [facility] is not running today.  11. PREGNANCY: "Is there any chance you are pregnant?" "When was your last menstrual period?"       N/A due to age  Protocols used: Abdominal Pain - [facility]

## 2022-01-21 NOTE — Telephone Encounter (Addendum)
  Chief Complaint: Pain in stomach with diarrhea since Monday Symptoms: Stomach pain and diarrhea.   No longer vomiting but did the first few days. Frequency: Since Monday Pertinent Negatives: Patient denies vomiting now.   OTC medications did not help Disposition: [] ED /[x] Urgent Care (no appt availability in office) / [] Appointment(In office/virtual)/ []  [place] [facility]/ [] Home Care/ [] Refused Recommended Disposition /[] [place] [facility]/ []  Follow-up with PCP Additional Notes: Pt refuses to go to the ED due to a bad experience.   She was agreeable to going to the urgent care.   She has an appt. 02/10/2022 at [facility].   She called in with Spanish interpreter [phone]

## 2022-01-21 NOTE — Discharge Instructions (Addendum)
Take Lomotil tablets--1 tablet 4 times daily as needed for diarrhea.  This medication could potentially make you sleepy(Tome Ud 1 tableta 4 veces al dia si tiene diarrhea; puede causar sueno)  Take in plenty of fluids, mainly clear liquids and bland things.(Tome Ud suficiente liquidos, por favor; liquidos claros y blandas)  We have drawn blood tests and have ordered stool tests.  Staff will notify you if anything is positive and needs treatment.(Las enfermeras le hablarian si hay algo que necesita tratamiento) Please also call your primary care tomorrow to let them know that you are having trouble(por favor, hable Ud a su doctor general manana)

## 2022-01-21 NOTE — ED Provider Notes (Signed)
[facility]    CSN: [id number] Arrival date & time: 01/21/22  1623      History   Chief Complaint Chief Complaint  Patient presents with   Abdominal Pain    HPI Claudia Hernandez is a 71 y.o. female.    Abdominal Pain  Here for vomiting and diarrhea and abdominal pain.  On August 14 she had epigastric and periumbilical pain and vomiting and diarrhea.  She got better taking Imodium at home, and was improved by August 16.  Then overnight last night and into today she is experience diarrhea again about 6 or 7 times.  She does have some tenesmus sometimes.  In the periumbilical pain has returned.  She states she also now is hurting in her back.  She is no longer vomiting.  She is able to drink fluids today without vomiting it  No blood in the stool.  No fever or chills or cough or cold symptoms.  She takes losartan for blood pressure  Past Medical History:  Diagnosis Date   Arthritis    Asthma    hx of   Heart murmur    unsure of this, but was told she had one by MD in [place]   Hypertension     Patient Active Problem List   Diagnosis Date Noted   Chronic diffuse otitis externa of both ears 08/04/2021   Bilateral knee pain 03/24/2021   Elevated LDL cholesterol level 06/17/2020   Bilateral hearing loss due to cerumen impaction 06/16/2020   Toe pain, right 05/13/2020   Essential hypertension 05/13/2020    Past Surgical History:  Procedure Laterality Date   BLADDER SURGERY     CESAREAN SECTION     CHOLECYSTECTOMY     gallbladder removed     left foot surgery      OB History   No obstetric history on file.      Home Medications    Prior to Admission medications   Medication Sig Start Date End Date Taking? Authorizing Provider  diphenoxylate-atropine (LOMOTIL) 2.5-0.025 MG tablet Take 1 tablet by mouth 4 (four) times daily as needed for diarrhea or loose stools. 01/21/22  Yes Zenia Resides, MD  losartan (COZAAR) 100 MG tablet  Take 1 tablet (100 mg total) by mouth daily. 08/04/21 08/04/22 Yes Storm Frisk, MD  ACETAMINOPHEN PO Take by mouth. PRN Patient not taking: Reported on 08/04/2021    [provider]  albuterol (VENTOLIN HFA) 108 (90 Base) MCG/ACT inhaler Inhale 2 puffs into the lungs every 6 (six) hours as needed for wheezing or shortness of breath. 11/05/21   Anders Simmonds, PA-C  atorvastatin (LIPITOR) 10 MG tablet Take 1 tablet (10 mg total) by mouth daily. 08/04/21 08/04/22  Storm Frisk, MD  COLLAGEN PO Take by mouth.    [provider]  dicyclomine (BENTYL) 20 MG tablet Take 1 tablet (20 mg total) by mouth 2 (two) times daily. 10/07/21   Charlynne Pander, MD  esomeprazole (NEXIUM) 40 MG capsule Take 1 capsule (40 mg total) by mouth daily. 10/07/21   Charlynne Pander, MD  montelukast (SINGULAIR) 10 MG tablet Take 1 tablet (10 mg total) by mouth at bedtime. For wheezing 11/05/21   Anders Simmonds, PA-C  ondansetron (ZOFRAN) 4 MG tablet Take 1 tablet (4 mg total) by mouth every 6 (six) hours. 10/07/21   Charlynne Pander, MD    Family History Family History  Problem Relation Age of Onset  Drug abuse Neg Hx    Hypertension Neg Hx    Colon cancer Neg Hx    Esophageal cancer Neg Hx    Rectal cancer Neg Hx    Stomach cancer Neg Hx    Breast cancer Neg Hx     Social History Social History   Tobacco Use   Smoking status: Never   Smokeless tobacco: Never  Vaping Use   Vaping Use: Never used  Substance Use Topics   Alcohol use: Not Currently   Drug use: Never     Allergies   Patient has no known allergies.   Review of Systems Review of Systems  Gastrointestinal:  Positive for abdominal pain.     Physical Exam Triage Vital Signs ED Triage Vitals [01/21/22 1640]  Enc Vitals Group     BP 116/61     Pulse Rate 71     Resp 18     Temp 98.1 F (36.7 C)     Temp Source Oral     SpO2 98 %     Weight      Height      Head Circumference      Peak Flow      Pain  Score 8     Pain Loc      Pain Edu?      Excl. in GC?    No data found.  Updated Vital Signs BP 116/61   Pulse 71   Temp 98.1 F (36.7 C) (Oral)   Resp 18   SpO2 98%   Visual Acuity Right Eye Distance:   Left Eye Distance:   Bilateral Distance:    Right Eye Near:   Left Eye Near:    Bilateral Near:     Physical Exam Vitals reviewed.  Constitutional:      General: She is not in acute distress.    Appearance: She is not ill-appearing, toxic-appearing or diaphoretic.  HENT:     Nose: Nose normal.     Mouth/Throat:     Mouth: Mucous membranes are moist.     Pharynx: No oropharyngeal exudate or posterior oropharyngeal erythema.  Eyes:     Extraocular Movements: Extraocular movements intact.     Conjunctiva/sclera: Conjunctivae normal.     Pupils: Pupils are equal, round, and reactive to light.  Cardiovascular:     Rate and Rhythm: Normal rate and regular rhythm.     Heart sounds: No murmur heard. Pulmonary:     Effort: Pulmonary effort is normal.     Breath sounds: Normal breath sounds. No stridor. No wheezing, rhonchi or rales.  Abdominal:     General: Bowel sounds are normal. There is no distension.     Palpations: Abdomen is soft. There is no mass.     Tenderness: There is abdominal tenderness (In epigastric and periumbilical area). There is no guarding.  Musculoskeletal:     Cervical back: Neck supple.     Right lower leg: No edema.     Left lower leg: No edema.  Lymphadenopathy:     Cervical: No cervical adenopathy.  Skin:    Coloration: Skin is not jaundiced or pale.  Neurological:     General: No focal deficit present.     Mental Status: She is alert and oriented to person, place, and time.  Psychiatric:        Behavior: Behavior normal.      UC Treatments / Results  Labs (all labs ordered are listed, but only abnormal results are  displayed) Labs Reviewed  POCT URINALYSIS DIPSTICK, ED / UC - Abnormal; Notable for the following components:       Result Value   Hgb urine dipstick TRACE (*)    All other components within normal limits  C DIFFICILE QUICK SCREEN W PCR REFLEX    OVA + PARASITE EXAM  GASTROINTESTINAL PANEL BY PCR, STOOL (REPLACES STOOL CULTURE)  CBC  COMPREHENSIVE METABOLIC PANEL    EKG   Radiology No results found.  Procedures Procedures (including critical care time)  Medications Ordered in UC Medications - No data to display  Initial Impression / Assessment and Plan / UC Course  I have reviewed the triage vital signs and the nursing notes.  Pertinent labs & imaging results that were available during my care of the patient were reviewed by me and considered in my medical decision making (see chart for details).     She is calm and well-appearing in the exam room.  Her vital signs are reassuring.  I am ordering stool studies, and I am going to send her in a little bit of Lomotil to try to calm down her diarrhea and cramping.  I have asked her to please contact her primary care tomorrow to let them know this is going on.  Staff will notify her of any abnormals on the labs.  CBC and chemistry panel are also drawn Final Clinical Impressions(s) / UC Diagnoses   Final diagnoses:  Diarrhea, unspecified type  Periumbilical abdominal pain     Discharge Instructions      Take Lomotil tablets--1 tablet 4 times daily as needed for diarrhea.  This medication could potentially make you sleepy(Tome Ud 1 tableta 4 veces al dia si tiene diarrhea; puede causar sueno)  Take in plenty of fluids, mainly clear liquids and bland things.(Tome Ud suficiente liquidos, por favor; liquidos claros y blandas)  We have drawn blood tests and have ordered stool tests.  Staff will notify you if anything is positive and needs treatment.(Las enfermeras le hablarian si hay algo que necesita tratamiento) Please also call your primary care tomorrow to let them know that you are having trouble(por favor, hable Ud a su doctor general  manana)      ED Prescriptions     Medication Sig Dispense Auth. Provider   diphenoxylate-atropine (LOMOTIL) 2.5-0.025 MG tablet Take 1 tablet by mouth 4 (four) times daily as needed for diarrhea or loose stools. 8 tablet Dinisha Cai, Janace Aris, MD      I have reviewed the PDMP during this encounter.   Zenia Resides, MD 01/21/22 1730

## 2022-01-21 NOTE — ED Triage Notes (Signed)
Via [employer] video interpreter [phone]:  C/O left abd pain through to the left back. States pain started 3 days ago with just the abd pain along with vomiting and diarrhea. Tues & Wed pain had resolved, but today pain returned and is now also in left flank/low back. Continues to have diarrhea. Describes abd pain as transient and back pain as constant. Denies fevers. Has been taking Imodium and OTC indigestion med.

## 2022-01-22 ENCOUNTER — Other Ambulatory Visit: Payer: Self-pay

## 2022-01-29 ENCOUNTER — Other Ambulatory Visit: Payer: Self-pay

## 2022-02-05 ENCOUNTER — Ambulatory Visit: Payer: No Typology Code available for payment source

## 2022-02-05 ENCOUNTER — Ambulatory Visit: Payer: Self-pay | Attending: Nurse Practitioner

## 2022-02-18 ENCOUNTER — Other Ambulatory Visit: Payer: Self-pay

## 2022-02-18 ENCOUNTER — Ambulatory Visit: Payer: Self-pay | Attending: Critical Care Medicine | Admitting: Critical Care Medicine

## 2022-02-18 ENCOUNTER — Encounter: Payer: Self-pay | Admitting: Critical Care Medicine

## 2022-02-18 VITALS — BP 115/75 | HR 74 | Temp 97.9°F | Ht 59.0 in | Wt 129.8 lb

## 2022-02-18 DIAGNOSIS — H903 Sensorineural hearing loss, bilateral: Secondary | ICD-10-CM

## 2022-02-18 DIAGNOSIS — M79674 Pain in right toe(s): Secondary | ICD-10-CM

## 2022-02-18 DIAGNOSIS — M19042 Primary osteoarthritis, left hand: Secondary | ICD-10-CM | POA: Insufficient documentation

## 2022-02-18 DIAGNOSIS — R748 Abnormal levels of other serum enzymes: Secondary | ICD-10-CM

## 2022-02-18 DIAGNOSIS — I10 Essential (primary) hypertension: Secondary | ICD-10-CM

## 2022-02-18 DIAGNOSIS — Z23 Encounter for immunization: Secondary | ICD-10-CM

## 2022-02-18 DIAGNOSIS — E7800 Pure hypercholesterolemia, unspecified: Secondary | ICD-10-CM

## 2022-02-18 DIAGNOSIS — H60313 Diffuse otitis externa, bilateral: Secondary | ICD-10-CM

## 2022-02-18 MED ORDER — DICLOFENAC SODIUM 1 % EX GEL
2.0000 g | Freq: Four times a day (QID) | CUTANEOUS | 1 refills | Status: DC
  Filled 2022-02-18: qty 100, 12d supply, fill #0

## 2022-02-18 MED ORDER — LOSARTAN POTASSIUM 100 MG PO TABS
100.0000 mg | ORAL_TABLET | Freq: Every day | ORAL | 2 refills | Status: DC
  Filled 2022-02-18: qty 90, 90d supply, fill #0
  Filled 2022-05-18: qty 90, 90d supply, fill #1

## 2022-02-18 NOTE — Patient Instructions (Addendum)
A referral to ear nose and throat was made for your lack of hearing  Please call gastroenterology to follow-up on your liver condition a referral had already been made  Referral back to the foot doctor was made  Use Voltaren gel for your knees ankles and fingers 4 times daily prescription sent to our pharmacy  Stay on losartan daily for blood pressure  Flu vaccine was given  Stop atorvastatin and stay off this this may be elevating your liver function  Return to Dr. Delford Field 5 months   GI office number is below for your stomach  Our office received a referral for you to schedule an appointment. If you will give our office a call at your convenience to discuss scheduling at [phone] option 1   Se le hizo una derivacin a otorrinolaringologa por su falta de audicin.  Por favor llame a gastroenterologa para dar seguimiento a su condicin heptica ya se haba realizado una derivacin  Se realiz una nueva derivacin al podlogo.  Utilice Voltaren gel para rodillas, tobillos y dedos 4 veces al da con receta enviada a nuestra farmacia.  Mantngase con losartn diariamente para la presin arterial.  Se puso la vacuna contra la gripe  Deje de tomar atorvastatina y no tome esto, esto puede estar elevando su funcin heptica.  Regresar al Dr. Delford Field 5 meses  El nmero de la oficina gastrointestinal est a continuacin para su estmago Nuestra oficina recibi una referencia para que usted programe una cita. Si puede llamar a nuestra oficina cuando le convenga para discutir la programacin al [phone] opcin 1

## 2022-02-18 NOTE — Assessment & Plan Note (Signed)
Have to hold atorvastatin due to elevated liver function test need GI follow-up

## 2022-02-18 NOTE — Assessment & Plan Note (Signed)
Otitis externa has resolved

## 2022-02-18 NOTE — Assessment & Plan Note (Signed)
Persisting foot pain on the right and bilateral ankle pain will refer back to podiatry since the patient has the orange card and will give her topical Voltaren gel to apply to both feet and ankles

## 2022-02-18 NOTE — Assessment & Plan Note (Signed)
Blood pressure under good control continue losartan

## 2022-02-18 NOTE — Assessment & Plan Note (Signed)
Sensorineural deficit left greater than right but is present in both ears referral to ENT was made she will pay cash as this group does not accept the orange card

## 2022-02-18 NOTE — Assessment & Plan Note (Signed)
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

## 2022-02-18 NOTE — Assessment & Plan Note (Signed)
Patient given the number to call for gastroenterology she needs assessment of her liver

## 2022-02-18 NOTE — Progress Notes (Signed)
Established Patient Office Visit  Subjective:  Patient ID: Claudia Hernandez, female    DOB: 07/08/1950  Age: 71 y.o. MRN: [id number]  CC:  Chief Complaint  Patient presents with   Hypertension    HPI Claudia Hernandez presents for primary care follow-up and   07/2021 still having difficulty with fullness in the ears and difficulty hearing.  We try to lavage ears out in December unsuccessfully and gave her a course of Debrox but she did not ever fill this prescription.  She comes in today with similar complaints as in December.  She is also due a Prevnar 20 vaccine and she did agree to receive this.  This visit was assisted by Spanish and video interpreter [name] [phone]  Patient's been compliant with her blood pressure medicine and cholesterol medicine on arrival blood pressure is 121/79.  She is also here to see our financial counselor to get the orange card Patient is still having difficulty with left knee pain  9/14 Patient seen in return follow-up visit was assisted by Spanish interpreter video interpreter Radonna Ricker [phone].  Patient has multiple complaints at this visit.  She continues to not hear well despite having her ears cleaned out with wax 2 weeks ago by nursing.  She also complains of bilateral hand pain and swelling and pain in the ankle and right toes of the right foot.  She yet to go to ENT.  She does have the orange card now.  ENT does not take the orange card.  She has been seen by podiatry.  Also has elevated liver function test that evolved in May of this year after normal exam prior values.  We had started her on atorvastatin this has been held next had a liver functions in August were coming down to normal.  She has a gastroenterology referral but has yet to achieve.  1 barrier is she does not answer her phone when called another barrier is sometimes does not understand the individual on the phone because of no ability to understand [place].  Patient  did agree and receive the flu vaccine.  On arrival blood pressure is good 115/75 Patient was in the urgent care a month ago for diarrhea this is now totally resolved Past Medical History:  Diagnosis Date   Arthritis    Asthma    hx of   Chronic diffuse otitis externa of both ears 08/04/2021   Heart murmur    unsure of this, but was told she had one by MD in [place]   Hypertension     Past Surgical History:  Procedure Laterality Date   BLADDER SURGERY     CESAREAN SECTION     CHOLECYSTECTOMY     gallbladder removed     left foot surgery      Family History  Problem Relation Age of Onset   Drug abuse Neg Hx    Hypertension Neg Hx    Colon cancer Neg Hx    Esophageal cancer Neg Hx    Rectal cancer Neg Hx    Stomach cancer Neg Hx    Breast cancer Neg Hx     Social History   Socioeconomic History   Marital status: Single    Spouse name: Not on file   Number of children: 5   Years of education: Not on file   Highest education level: Bachelor's degree (e.g., BA, AB, BS)  Occupational History   Not on file  Tobacco Use   Smoking status:  Never   Smokeless tobacco: Never  Vaping Use   Vaping Use: Never used  Substance and Sexual Activity   Alcohol use: Not Currently   Drug use: Never   Sexual activity: Not on file  Other Topics Concern   Not on file  Social History Narrative   Not on file   Social Determinants of Health   Financial Resource Strain: Not on file  Food Insecurity: Food Insecurity Present (02/26/2021)   Hunger Vital Sign    Worried About Running Out of Food in the Last Year: Sometimes true    Ran Out of Food in the Last Year: Sometimes true  Transportation Needs: Unmet Transportation Needs (02/26/2021)   PRAPARE - Hydrologist (Medical): Yes    Lack of Transportation (Non-Medical): Yes  Physical Activity: Not on file  Stress: Not on file  Social Connections: Not on file  Intimate Partner Violence: Not on file     Outpatient Medications Prior to Visit  Medication Sig Dispense Refill   ACETAMINOPHEN PO Take by mouth. PRN     COLLAGEN PO Take by mouth.     losartan (COZAAR) 100 MG tablet Take 1 tablet (100 mg total) by mouth daily. 90 tablet 2   albuterol (VENTOLIN HFA) 108 (90 Base) MCG/ACT inhaler Inhale 2 puffs into the lungs every 6 (six) hours as needed for wheezing or shortness of breath. (Patient not taking: Reported on 02/18/2022) 18 g 2   atorvastatin (LIPITOR) 10 MG tablet Take 1 tablet (10 mg total) by mouth daily. (Patient not taking: Reported on 02/18/2022) 90 tablet 1   dicyclomine (BENTYL) 20 MG tablet Take 1 tablet (20 mg total) by mouth 2 (two) times daily. (Patient not taking: Reported on 02/18/2022) 20 tablet 0   diphenoxylate-atropine (LOMOTIL) 2.5-0.025 MG tablet Take 1 tablet by mouth 4 (four) times daily as needed for diarrhea or loose stools. (Patient not taking: Reported on 02/18/2022) 8 tablet 0   esomeprazole (NEXIUM) 40 MG capsule Take 1 capsule (40 mg total) by mouth daily. (Patient not taking: Reported on 02/18/2022) 30 capsule 0   montelukast (SINGULAIR) 10 MG tablet Take 1 tablet (10 mg total) by mouth at bedtime. For wheezing (Patient not taking: Reported on 02/18/2022) 30 tablet 3   ondansetron (ZOFRAN) 4 MG tablet Take 1 tablet (4 mg total) by mouth every 6 (six) hours. (Patient not taking: Reported on 02/18/2022) 12 tablet 0   No facility-administered medications prior to visit.    No Known Allergies  ROS Review of Systems  Constitutional: Negative.   HENT:  Positive for hearing loss. Negative for ear pain, postnasal drip, rhinorrhea, sinus pressure, sore throat, trouble swallowing and voice change.   Eyes: Negative.   Respiratory: Negative.  Negative for apnea, cough, choking, chest tightness, shortness of breath, wheezing and stridor.   Cardiovascular: Negative.  Negative for chest pain, palpitations and leg swelling.  Gastrointestinal: Negative.  Negative for  abdominal distention, abdominal pain, nausea and vomiting.  Genitourinary: Negative.   Musculoskeletal:  Positive for joint swelling. Negative for arthralgias and myalgias.       Right foot and left foot and ankle pain bilateral with swelling  Bilateral hand pain left worse than right  Skin: Negative.  Negative for rash.  Allergic/Immunologic: Negative.  Negative for environmental allergies and food allergies.  Neurological: Negative.  Negative for dizziness, syncope, weakness and headaches.  Hematological: Negative.  Negative for adenopathy. Does not bruise/bleed easily.  Psychiatric/Behavioral: Negative.  Negative for agitation  and sleep disturbance. The patient is not nervous/anxious.       Objective:    Physical Exam Vitals reviewed.  Constitutional:      Appearance: Normal appearance. She is well-developed. She is not diaphoretic.  HENT:     Head: Normocephalic and atraumatic.     Right Ear: External ear normal. There is no impacted cerumen.     Left Ear: External ear normal. There is no impacted cerumen.     Ears:     Comments: Right canal is widely open no wax left ear canal shows some wax but I can see the eardrum at both are improved from before    Nose: Nose normal. No nasal deformity, septal deviation, mucosal edema or rhinorrhea.     Right Sinus: No maxillary sinus tenderness or frontal sinus tenderness.     Left Sinus: No maxillary sinus tenderness or frontal sinus tenderness.     Mouth/Throat:     Mouth: Mucous membranes are moist.     Pharynx: Oropharynx is clear. No oropharyngeal exudate.  Eyes:     General: No scleral icterus.    Conjunctiva/sclera: Conjunctivae normal.     Pupils: Pupils are equal, round, and reactive to light.  Neck:     Thyroid: No thyromegaly.     Vascular: No carotid bruit or JVD.     Trachea: Trachea normal. No tracheal tenderness or tracheal deviation.  Cardiovascular:     Rate and Rhythm: Normal rate and regular rhythm.     Chest  Wall: PMI is not displaced.     Pulses: Normal pulses. No decreased pulses.     Heart sounds: Normal heart sounds, S1 normal and S2 normal. Heart sounds not distant. No murmur heard.    No systolic murmur is present.     No diastolic murmur is present.     No friction rub. No gallop. No S3 or S4 sounds.  Pulmonary:     Effort: Pulmonary effort is normal. No tachypnea, accessory muscle usage or respiratory distress.     Breath sounds: Normal breath sounds. No stridor. No decreased breath sounds, wheezing, rhonchi or rales.  Chest:     Chest wall: No tenderness.  Abdominal:     General: Bowel sounds are normal. There is no distension.     Palpations: Abdomen is soft. Abdomen is not rigid.     Tenderness: There is no abdominal tenderness. There is no guarding or rebound.  Musculoskeletal:        General: Tenderness present. Normal range of motion.     Cervical back: Normal range of motion and neck supple. No edema, erythema or rigidity. No muscular tenderness. Normal range of motion.     Comments: Bilateral ankle tenderness and right great and second toe tenderness  Heberden's nodes in both distal fingers of left and right hands with discomfort  Lymphadenopathy:     Head:     Right side of head: No submental or submandibular adenopathy.     Left side of head: No submental or submandibular adenopathy.     Cervical: No cervical adenopathy.  Skin:    General: Skin is warm and dry.     Coloration: Skin is not pale.     Findings: No rash.     Nails: There is no clubbing.  Neurological:     Mental Status: She is alert and oriented to person, place, and time.     Sensory: No sensory deficit.  Psychiatric:  Mood and Affect: Mood normal.        Speech: Speech normal.        Behavior: Behavior normal.        Thought Content: Thought content normal.     BP 115/75   Pulse 74   Temp 97.9 F (36.6 C) (Oral)   Ht _0  (1.499 m)   Wt 129 lb 12.8 oz (58.9 kg)   SpO2 99%   BMI  26.22 kg/m  Wt Readings from Last 3 Encounters:  02/18/22 129 lb 12.8 oz (58.9 kg)  11/05/21 135 lb 6.4 oz (61.4 kg)  10/07/21 136 lb 11 oz (62 kg)     Health Maintenance Due  Topic Date Due   COVID-19 Vaccine (4 - Pfizer series) 10/14/2020    There are no preventive care reminders to display for this patient.  Lab Results  Component Value Date   TSH 1.870 07/20/2019   Lab Results  Component Value Date   WBC 5.9 01/21/2022   HGB 14.0 01/21/2022   HCT 40.6 01/21/2022   MCV 104.9 (H) 01/21/2022   PLT 246 01/21/2022   Lab Results  Component Value Date   NA 137 01/21/2022   K 3.5 01/21/2022   CO2 24 01/21/2022   GLUCOSE 88 01/21/2022   BUN 10 01/21/2022   CREATININE 0.58 01/21/2022   BILITOT 0.5 01/21/2022   ALKPHOS 73 01/21/2022   AST 56 (H) 01/21/2022   ALT 56 (H) 01/21/2022   PROT 7.1 01/21/2022   ALBUMIN 4.1 01/21/2022   CALCIUM 8.8 (L) 01/21/2022   ANIONGAP 7 01/21/2022   EGFR 97 03/24/2021   Lab Results  Component Value Date   CHOL 172 08/04/2021   Lab Results  Component Value Date   HDL 48 08/04/2021   Lab Results  Component Value Date   LDLCALC 100 (H) 08/04/2021   Lab Results  Component Value Date   TRIG 136 08/04/2021   Lab Results  Component Value Date   CHOLHDL 3.6 08/04/2021   Lab Results  Component Value Date   HGBA1C 5.2 08/04/2021      Assessment & Plan:   Problem List Items Addressed This Visit       Cardiovascular and Mediastinum   Essential hypertension    Blood pressure under good control continue losartan      Relevant Medications   losartan (COZAAR) 100 MG tablet     Nervous and Auditory   SND (sensory-neural deafness), asymmetrical    Sensorineural deficit left greater than right but is present in both ears referral to ENT was made she will pay cash as this group does not accept the orange card      Relevant Orders   Ambulatory referral to ENT   RESOLVED: Chronic diffuse otitis externa of both ears     Otitis externa has resolved        Musculoskeletal and Integument   Arthritis of finger of left hand    Progressive arthritis of the index and middle finger of 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  Patient given Voltaren gel to apply topically we will hold off on orthopedic hand        Other   Toe pain, right    Persisting foot pain on the right and bilateral ankle pain will refer back to podiatry since the patient has the orange card and will give her topical Voltaren gel to apply to both feet and ankles      Relevant Orders  Ambulatory referral to Podiatry   Elevated LDL cholesterol level    Have to hold atorvastatin due to elevated liver function test need GI follow-up      Elevated liver enzymes - Primary    Patient given the number to call for gastroenterology she needs assessment of her liver      Relevant Orders   Hepatic Function Panel   Other Visit Diagnoses     Influenza vaccine needed       Relevant Orders   Flu Vaccine QUAD High Dose(Fluad) (Completed)      Meds ordered this encounter  Medications   diclofenac Sodium (VOLTAREN) 1 % GEL    Sig: Apply 2 g topically 4 (four) times daily.    Dispense:  100 g    Refill:  1    To affected areas on hands and ankles   losartan (COZAAR) 100 MG tablet    Sig: Take 1 tablet (100 mg total) by mouth daily.    Dispense:  90 tablet    Refill:  2  40 minutes spent extra time needed because of language barrier  Follow-up: No follow-ups on file.    Asencion Noble, MD

## 2022-02-19 ENCOUNTER — Other Ambulatory Visit: Payer: Self-pay | Admitting: Critical Care Medicine

## 2022-02-19 ENCOUNTER — Other Ambulatory Visit: Payer: Self-pay

## 2022-02-19 ENCOUNTER — Telehealth: Payer: Self-pay

## 2022-02-19 DIAGNOSIS — Z789 Other specified health status: Secondary | ICD-10-CM | POA: Insufficient documentation

## 2022-02-19 LAB — HEPATIC FUNCTION PANEL
ALT: 17 IU/L (ref 0–32)
AST: 24 IU/L (ref 0–40)
Albumin: 4.5 g/dL (ref 3.9–4.9)
Alkaline Phosphatase: 85 IU/L (ref 44–121)
Bilirubin Total: 0.4 mg/dL (ref 0.0–1.2)
Bilirubin, Direct: <0.1 mg/dL (ref 0.00–0.40)
Total Protein: 7.2 g/dL (ref 6.0–8.5)

## 2022-02-19 MED ORDER — EZETIMIBE 10 MG PO TABS
10.0000 mg | ORAL_TABLET | Freq: Every day | ORAL | 3 refills | Status: DC
  Filled 2022-02-19: qty 90, 90d supply, fill #0
  Filled 2022-05-18: qty 90, 90d supply, fill #1

## 2022-02-19 NOTE — Progress Notes (Signed)
Let pt know liver is now normal  I sent Zetia to pharmacy for cholesterol will not harm liver, needs to keep /make GI appt.     I have STOPPED Atorvastatin and placed on her allergy list no STATINS

## 2022-02-19 NOTE — Telephone Encounter (Signed)
Pt was called and is aware of results, DOB was confirmed.   interpreter [id number]

## 2022-02-19 NOTE — Telephone Encounter (Signed)
-----   Message from Storm Frisk, MD sent at 02/19/2022  6:14 AM EDT ----- Let pt know liver is now normal  I sent Zetia to pharmacy for cholesterol will not harm liver, needs to keep /make GI appt.     I have STOPPED Atorvastatin and placed on her allergy list no STATINS

## 2022-02-22 ENCOUNTER — Other Ambulatory Visit: Payer: Self-pay

## 2022-02-22 ENCOUNTER — Telehealth: Payer: Self-pay | Admitting: Critical Care Medicine

## 2022-02-24 NOTE — Telephone Encounter (Signed)
Needs referral

## 2022-02-24 NOTE — Telephone Encounter (Signed)
Called patient and she states that she received a call stating that she need her mammogram done again,  I did see in the sytem that it was done last year and I let her know that as well. She stated that a week ago she got a call and was supposed to get some paperwork when she was here last. I didn't see anything in the last visit AVS  Im sure if she talked with you about this matter.

## 2022-02-24 NOTE — Telephone Encounter (Signed)
I ordersd another mammogram she was supposed to sign scholarship paperwork for this new mammogram

## 2022-02-25 NOTE — Telephone Encounter (Signed)
Called and patient is aware, will come and sign paperwork Monday   Interpreter id [phone]

## 2022-03-15 ENCOUNTER — Ambulatory Visit: Payer: No Typology Code available for payment source | Admitting: Podiatry

## 2022-03-15 IMAGING — MG MM DIGITAL SCREENING BILAT W/ TOMO AND CAD
4 series · 4 of 4 positions shown · non-contrast
Comparison: None.

CLINICAL DATA: Screening.

EXAM:
DIGITAL SCREENING BILATERAL MAMMOGRAM WITH TOMOSYNTHESIS AND CAD
TECHNIQUE: Bilateral screening digital craniocaudal and mediolateral oblique
mammograms were obtained. Bilateral screening digital breast
tomosynthesis was performed. The images were evaluated with
computer-aided detection.

[L CC synth-2D]
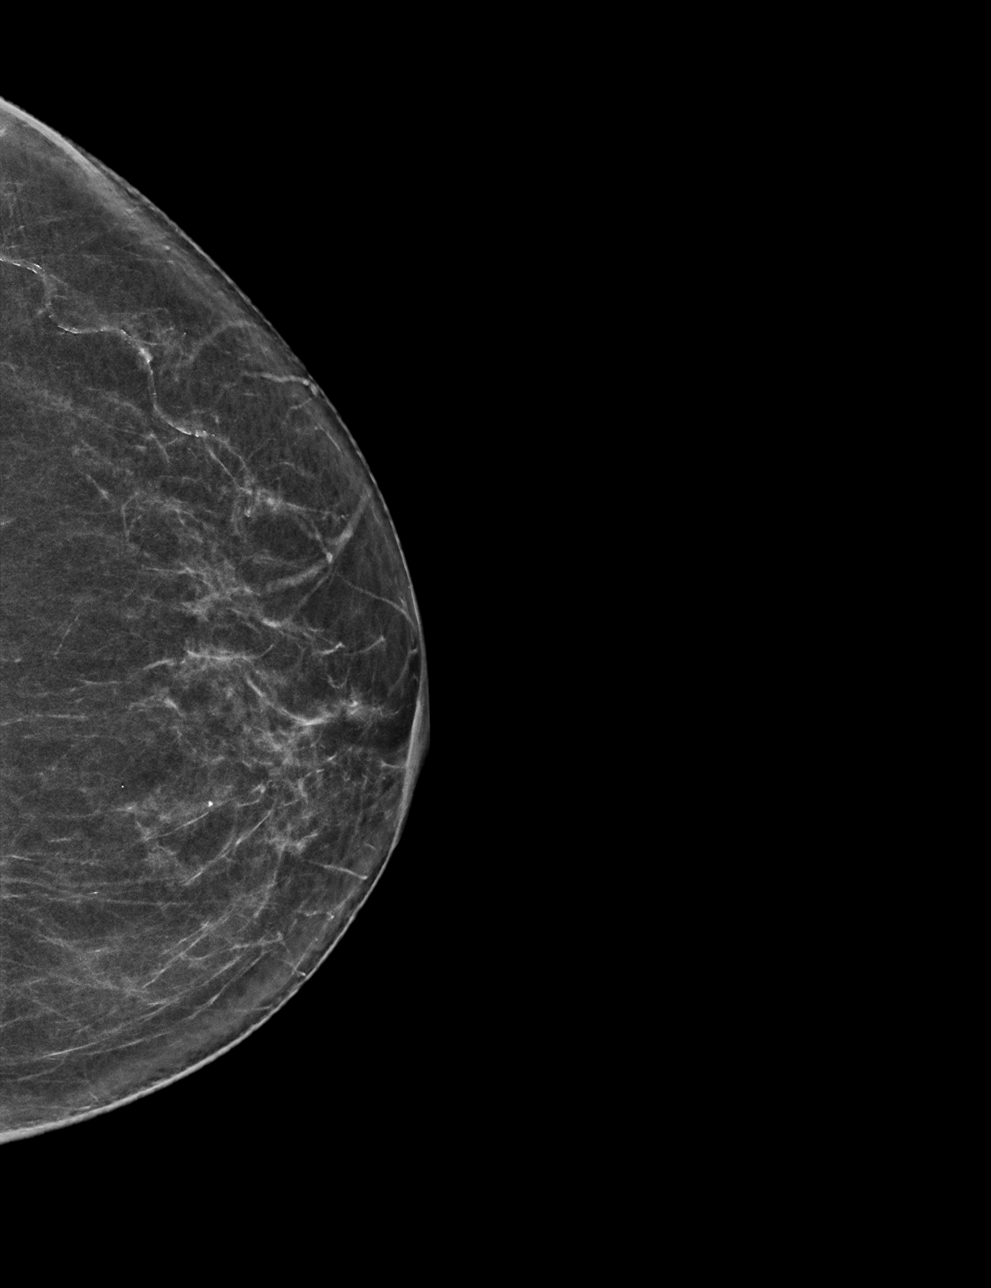

[L MLO synth-2D]
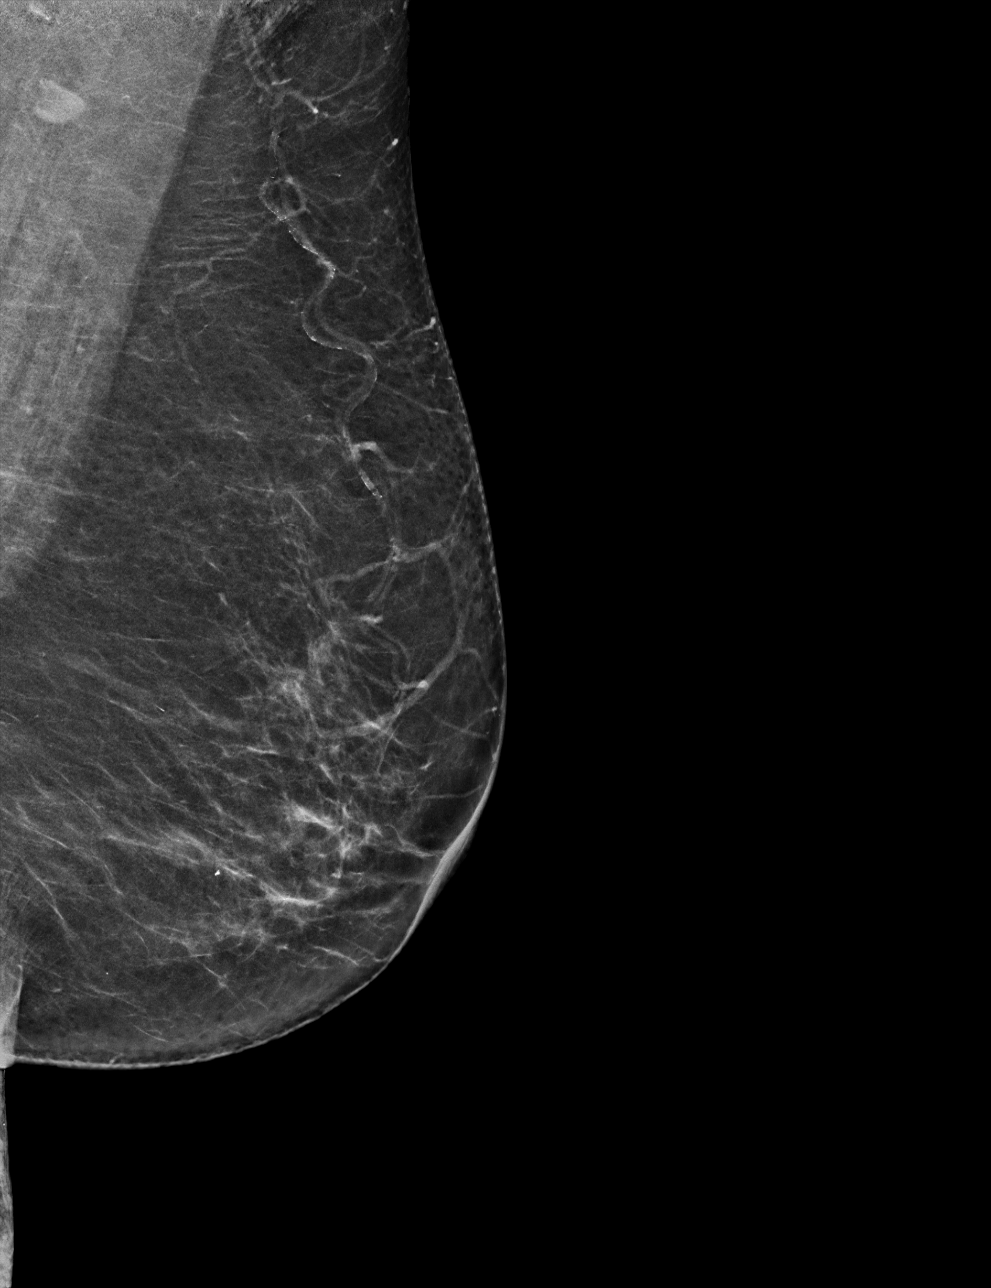

[R CC synth-2D]
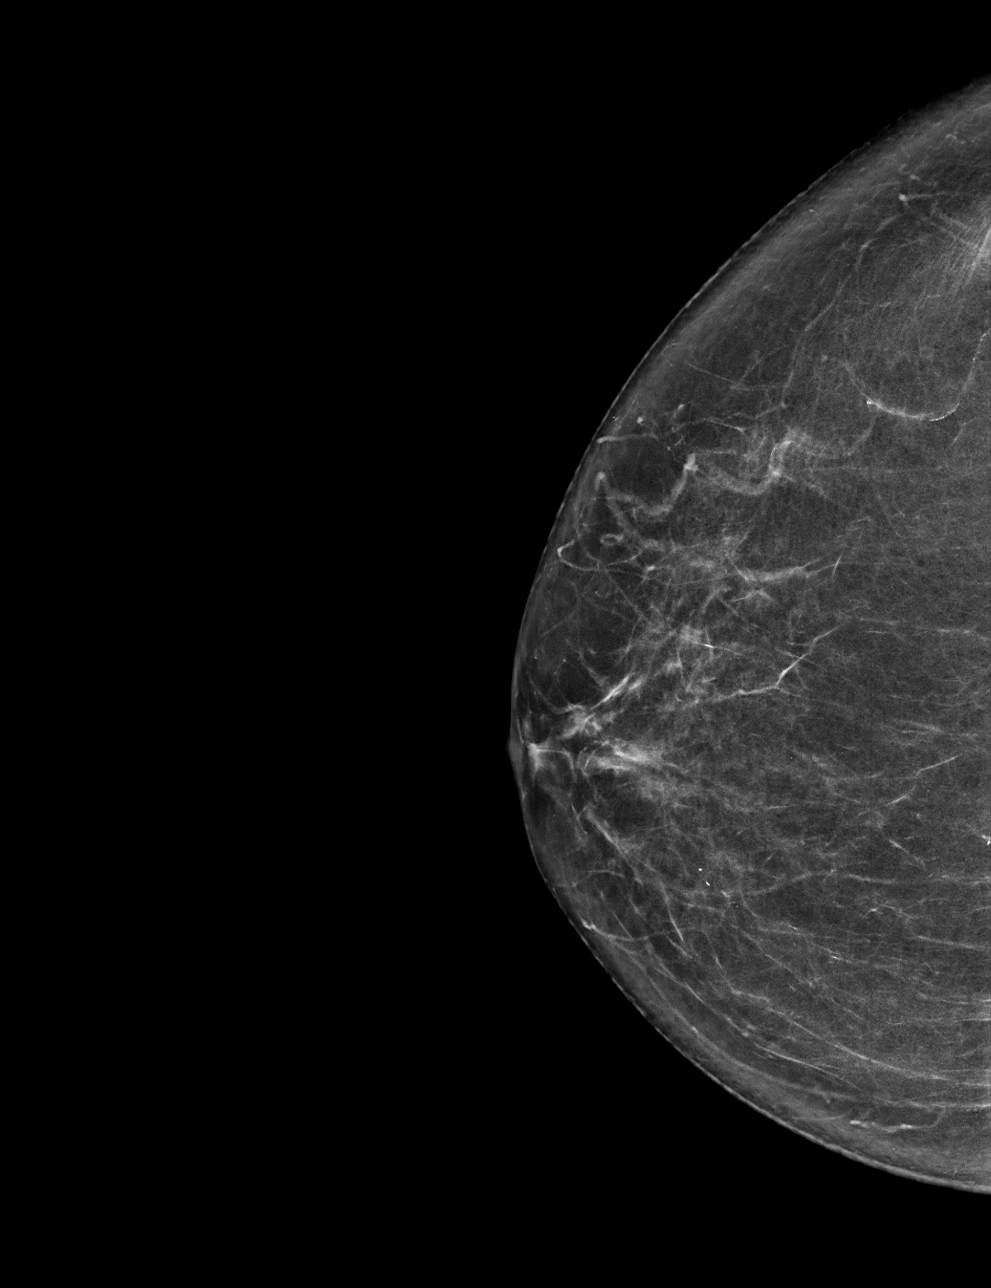

[R MLO synth-2D]
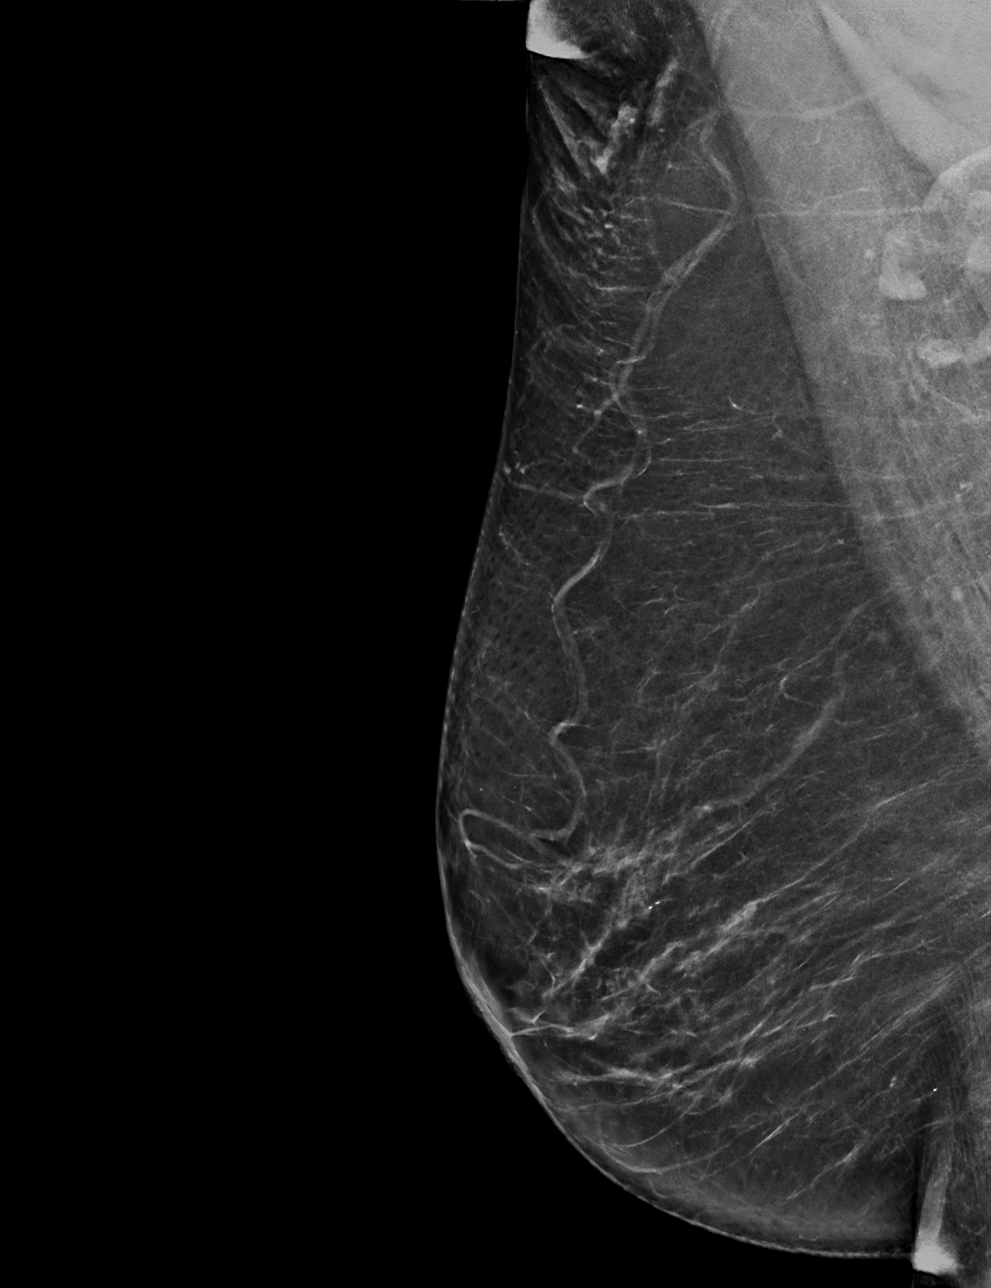

[4 of 4 positions shown; findings below may reference images not displayed]

ACR Breast Density Category b: There are scattered areas of
fibroglandular density.
FINDINGS: There are no findings suspicious for malignancy.
IMPRESSION: No mammographic evidence of malignancy. A result letter of this
screening mammogram will be mailed directly to the patient.

RECOMMENDATION:
Screening mammogram in one year. (Code:XG-X-X7B)

BI-RADS CATEGORY  1: Negative.

## 2022-03-30 ENCOUNTER — Ambulatory Visit: Payer: No Typology Code available for payment source | Admitting: Podiatry

## 2022-05-12 ENCOUNTER — Other Ambulatory Visit: Payer: Self-pay | Admitting: Critical Care Medicine

## 2022-05-12 DIAGNOSIS — I10 Essential (primary) hypertension: Secondary | ICD-10-CM

## 2022-05-12 NOTE — Telephone Encounter (Signed)
Attempted to call pharmacy - wait time 5 minutes-  Duplicate request Rx Zetia 02/19/22 #90 3RF     Cozaar 02/18/22 #90 2RF . Requested Prescriptions  Pending Prescriptions Disp Refills   ezetimibe (ZETIA) 10 MG tablet 90 tablet 3    Sig: Take 1 tablet (10 mg total) by mouth daily.     Cardiovascular:  Antilipid - Sterol Transport Inhibitors Failed - 05/12/2022  3:51 PM      Failed - Lipid Panel in normal range within the last 12 months    Cholesterol, Total  Date Value Ref Range Status  08/04/2021 172 100 - 199 mg/dL Final   LDL Chol Calc (NIH)  Date Value Ref Range Status  08/04/2021 100 (H) 0 - 99 mg/dL Final   HDL  Date Value Ref Range Status  08/04/2021 48 >39 mg/dL Final   Triglycerides  Date Value Ref Range Status  08/04/2021 136 0 - 149 mg/dL Final         Passed - AST in normal range and within 360 days    AST  Date Value Ref Range Status  02/18/2022 24 0 - 40 IU/L Final         Passed - ALT in normal range and within 360 days    ALT  Date Value Ref Range Status  02/18/2022 17 0 - 32 IU/L Final         Passed - Patient is not pregnant      Passed - Valid encounter within last 12 months    Recent Outpatient Visits           2 months ago Elevated liver enzymes   [facility] Storm Frisk, MD   6 months ago Elevated LFTs   [facility] [place], Marylene Land M, [place]   9 months ago Elevated LDL cholesterol level   [facility] Storm Frisk, MD   1 year ago Bilateral hearing loss due to cerumen impaction   [facility] Storm Frisk, MD   1 year ago Elevated LDL cholesterol level   [facility] Storm Frisk, MD       Future Appointments             In 2 months [facility] Frisk, MD  [facility]             losartan (COZAAR) 100 MG tablet 90 tablet 2    Sig: Take  1 tablet (100 mg total) by mouth daily.     Cardiovascular:  Angiotensin Receptor Blockers Passed - 05/12/2022  3:51 PM      Passed - Cr in normal range and within 180 days    Creatinine, Ser  Date Value Ref Range Status  01/21/2022 0.58 0.44 - 1.00 mg/dL Final         Passed - K in normal range and within 180 days    Potassium  Date Value Ref Range Status  01/21/2022 3.5 3.5 - 5.1 mmol/L Final         Passed - Patient is not pregnant      Passed - Last BP in normal range    BP Readings from Last 1 Encounters:  02/18/22 115/75         Passed - Valid encounter within last 6 months    Recent Outpatient Visits  2 months ago Elevated liver enzymes   [facility] Storm Frisk, MD   6 months ago Elevated LFTs   [facility] [place], Marylene Land M, [place]   9 months ago Elevated LDL cholesterol level   [facility] Storm Frisk, MD   1 year ago Bilateral hearing loss due to cerumen impaction   [facility] Storm Frisk, MD   1 year ago Elevated LDL cholesterol level   [facility] Storm Frisk, MD       Future Appointments             In 2 months Storm Frisk, MD [facility]

## 2022-05-12 NOTE — Telephone Encounter (Signed)
Medication Refill - Medication: losartan (COZAAR) 100 MG tablet, ezetimibe (ZETIA) 10 MG tablet   Has the patient contacted their pharmacy? Yes.   No, more refills.  (Agent: If no, request that the patient contact the pharmacy for the refill. If patient does not wish to contact the pharmacy document the reason why and proceed with request.)   Preferred Pharmacy (with phone number or street name):  [facility]  [address] [place] [address]  Phone: [phone] Fax: [phone]  Hours: M-F 7:30a-6:00p   Has the patient been seen for an appointment in the last year OR does the patient have an upcoming appointment? Yes.    Agent: Please be advised that RX refills may take up to 3 business days. We ask that you follow-up with your pharmacy.

## 2022-05-13 ENCOUNTER — Telehealth: Payer: Self-pay | Admitting: Emergency Medicine

## 2022-05-13 NOTE — Telephone Encounter (Signed)
She had one done last year, I want to make sure before I call that mammogram are every two year correct?

## 2022-05-13 NOTE — Telephone Encounter (Signed)
Copied from CRM [id number]. Topic: General - Inquiry >> May 12, 2022  3:44 PM De Blanch wrote: Reason for CRM: Pt stated she received a letter with a reminder for a mammogram. Pt is requesting an order be sent to have her mammogram done. Pt is requesting a call back with a Spanish interpreter.  Please advise.

## 2022-05-14 NOTE — Telephone Encounter (Signed)
No, mammograms are yearly

## 2022-05-17 NOTE — Telephone Encounter (Signed)
Called patient she has not had her mammogram this year, due to no insurance she needs to fill the mammogram scholarship forms. She is aware of this and will come up this week to sign

## 2022-05-18 ENCOUNTER — Other Ambulatory Visit: Payer: Self-pay

## 2022-07-21 ENCOUNTER — Other Ambulatory Visit: Payer: Self-pay

## 2022-07-21 ENCOUNTER — Ambulatory Visit: Payer: Self-pay | Attending: Critical Care Medicine | Admitting: Critical Care Medicine

## 2022-07-21 ENCOUNTER — Encounter: Payer: Self-pay | Admitting: Critical Care Medicine

## 2022-07-21 VITALS — BP 122/75 | HR 71 | Temp 97.9°F | Resp 16 | Wt 128.0 lb

## 2022-07-21 DIAGNOSIS — H903 Sensorineural hearing loss, bilateral: Secondary | ICD-10-CM

## 2022-07-21 DIAGNOSIS — M25532 Pain in left wrist: Secondary | ICD-10-CM

## 2022-07-21 DIAGNOSIS — H6123 Impacted cerumen, bilateral: Secondary | ICD-10-CM

## 2022-07-21 DIAGNOSIS — I10 Essential (primary) hypertension: Secondary | ICD-10-CM

## 2022-07-21 DIAGNOSIS — Z1231 Encounter for screening mammogram for malignant neoplasm of breast: Secondary | ICD-10-CM

## 2022-07-21 MED ORDER — LOSARTAN POTASSIUM 100 MG PO TABS
100.0000 mg | ORAL_TABLET | Freq: Every day | ORAL | 2 refills | Status: DC
  Filled 2022-07-21 – 2022-08-16 (×2): qty 90, 90d supply, fill #0

## 2022-07-21 MED ORDER — EZETIMIBE 10 MG PO TABS
10.0000 mg | ORAL_TABLET | Freq: Every day | ORAL | 3 refills | Status: DC
  Filled 2022-07-21 – 2022-08-16 (×2): qty 90, 90d supply, fill #0
  Filled 2023-03-01: qty 90, 90d supply, fill #1
  Filled 2023-07-08: qty 90, 90d supply, fill #2

## 2022-07-21 MED ORDER — MELOXICAM 7.5 MG PO TABS
7.5000 mg | ORAL_TABLET | Freq: Every day | ORAL | 0 refills | Status: DC
  Filled 2022-07-21 – 2023-03-01 (×2): qty 30, 30d supply, fill #0

## 2022-07-21 NOTE — Patient Instructions (Signed)
Medication refill sent to pharmacy downstairs Start meloxicam 1 daily for wrist pain and get a splint to the left wrist obtained from any over-the-counter pharmacy  Ear doctor referral will be made  Wrist doctor referral will be made  Mammogram is ordered you will send a scholarship application today    Return to Dr. Joya Gaskins 6 months

## 2022-07-21 NOTE — Progress Notes (Unsigned)
Left arms swelling and pain when she picks up objects. Only when she it hurts.     Left ear concern. Unable to hear well from left ear. No changes after ear lavage.   Mammogram

## 2022-07-21 NOTE — Progress Notes (Unsigned)
Established Patient Office Visit  Subjective:  Patient ID: Claudia Hernandez, female    DOB: [birth year]  Age: 72 y.o. MRN: [id number]  CC:  No chief complaint on file.   HPI Claudia Hernandez [id number] [name] presents for primary care follow-up and   07/2021 still having difficulty with fullness in the ears and difficulty hearing.  We try to lavage ears out in December unsuccessfully and gave her a course of Debrox but she did not ever fill this prescription.  She comes in today with similar complaints as in December.  She is also due a Prevnar 20 vaccine and she did agree to receive this.  This visit was assisted by Spanish and video interpreter [name] [phone]  Patient's been compliant with her blood pressure medicine and cholesterol medicine on arrival blood pressure is 121/79.  She is also here to see our financial counselor to get the orange card Patient is still having difficulty with left knee pain  9/14 Patient seen in return follow-up visit was assisted by Spanish interpreter video interpreter Radonna Ricker [phone].  Patient has multiple complaints at this visit.  She continues to not hear well despite having her ears cleaned out with wax 2 weeks ago by nursing.  She also complains of bilateral hand pain and swelling and pain in the ankle and right toes of the right foot.  She yet to go to ENT.  She does have the orange card now.  ENT does not take the orange card.  She has been seen by podiatry.  Also has elevated liver function test that evolved in May of this year after normal exam prior values.  We had started her on atorvastatin this has been held next had a liver functions in August were coming down to normal.  She has a gastroenterology referral but has yet to achieve.  1 barrier is she does not answer her phone when called another barrier is sometimes does not understand the individual on the phone because of no ability to understand [place].  Patient did agree and receive the flu  vaccine.  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 08/04/2021   Heart murmur    unsure of this, but was told she had one by MD in [place]   Hypertension     Past Surgical History:  Procedure Laterality Date   BLADDER SURGERY     CESAREAN SECTION     CHOLECYSTECTOMY     gallbladder removed     left foot surgery      Family History  Problem Relation Age of Onset   Drug abuse Neg Hx    Hypertension Neg Hx    Colon cancer Neg Hx    Esophageal cancer Neg Hx    Rectal cancer Neg Hx    Stomach cancer Neg Hx    Breast cancer Neg Hx     Social History   Socioeconomic History   Marital status: Single    Spouse name: Not on file   Number of children: 5   Years of education: Not on file   Highest education level: Bachelor's degree (e.g., BA, AB, BS)  Occupational History   Not on file  Tobacco Use   Smoking status: Never  Smokeless tobacco: Never  Vaping Use   Vaping Use: Never used  Substance and Sexual Activity   Alcohol use: Not Currently   Drug use: Never   Sexual activity: Not on file  Other Topics Concern   Not on file  Social History Narrative   Not on file   Social Determinants of Health   Financial Resource Strain: Not on file  Food Insecurity: Food Insecurity Present (02/26/2021)   Hunger Vital Sign    Worried About Running Out of Food in the Last Year: Sometimes true    Ran Out of Food in the Last Year: Sometimes true  Transportation Needs: Unmet Transportation Needs (02/26/2021)   PRAPARE - Hydrologist (Medical): Yes    Lack of Transportation (Non-Medical): Yes  Physical Activity: Not on file  Stress: Not on file  Social Connections: Not on file  Intimate Partner Violence: Not on file    Outpatient  Medications Prior to Visit  Medication Sig Dispense Refill   ACETAMINOPHEN PO Take by mouth. PRN     COLLAGEN PO Take by mouth.     diclofenac Sodium (VOLTAREN) 1 % GEL Apply 2 g topically 4 (four) times daily. 100 g 1   ezetimibe (ZETIA) 10 MG tablet Take 1 tablet (10 mg total) by mouth daily. 90 tablet 3   losartan (COZAAR) 100 MG tablet Take 1 tablet (100 mg total) by mouth daily. 90 tablet 2   No facility-administered medications prior to visit.    Allergies  Allergen Reactions   Statins Other (See Comments)    ELEVATED LFTs    ROS Review of Systems  Constitutional: Negative.   HENT:  Positive for hearing loss. Negative for ear pain, postnasal drip, rhinorrhea, sinus pressure, sore throat, trouble swallowing and voice change.   Eyes: Negative.   Respiratory: Negative.  Negative for apnea, cough, choking, chest tightness, shortness of breath, wheezing and stridor.   Cardiovascular: Negative.  Negative for chest pain, palpitations and leg swelling.  Gastrointestinal: Negative.  Negative for abdominal distention, abdominal pain, nausea and vomiting.  Genitourinary: Negative.   Musculoskeletal:  Positive for joint swelling. Negative for arthralgias and myalgias.       Right foot and left foot and ankle pain bilateral with swelling  Bilateral hand pain left worse than right  Skin: Negative.  Negative for rash.  Allergic/Immunologic: Negative.  Negative for environmental allergies and food allergies.  Neurological: Negative.  Negative for dizziness, syncope, weakness and headaches.  Hematological: Negative.  Negative for adenopathy. Does not bruise/bleed easily.  Psychiatric/Behavioral: Negative.  Negative for agitation and sleep disturbance. The patient is not nervous/anxious.       Objective:    Physical Exam Vitals reviewed.  Constitutional:      Appearance: Normal appearance. She is well-developed. She is not diaphoretic.  HENT:     Head: Normocephalic and atraumatic.      Right Ear: External ear normal. There is no impacted cerumen.     Left Ear: External ear normal. There is no impacted cerumen.     Ears:     Comments: Right canal is widely open no wax left ear canal shows some wax but I can see the eardrum at both are improved from before    Nose: Nose normal. No nasal deformity, septal deviation, mucosal edema or rhinorrhea.     Right Sinus: No maxillary sinus tenderness or frontal sinus tenderness.     Left Sinus: No maxillary sinus tenderness or frontal sinus  tenderness.     Mouth/Throat:     Mouth: Mucous membranes are moist.     Pharynx: Oropharynx is clear. No oropharyngeal exudate.  Eyes:     General: No scleral icterus.    Conjunctiva/sclera: Conjunctivae normal.     Pupils: Pupils are equal, round, and reactive to light.  Neck:     Thyroid: No thyromegaly.     Vascular: No carotid bruit or JVD.     Trachea: Trachea normal. No tracheal tenderness or tracheal deviation.  Cardiovascular:     Rate and Rhythm: Normal rate and regular rhythm.     Chest Wall: PMI is not displaced.     Pulses: Normal pulses. No decreased pulses.     Heart sounds: Normal heart sounds, S1 normal and S2 normal. Heart sounds not distant. No murmur heard.    No systolic murmur is present.     No diastolic murmur is present.     No friction rub. No gallop. No S3 or S4 sounds.  Pulmonary:     Effort: Pulmonary effort is normal. No tachypnea, accessory muscle usage or respiratory distress.     Breath sounds: Normal breath sounds. No stridor. No decreased breath sounds, wheezing, rhonchi or rales.  Chest:     Chest wall: No tenderness.  Abdominal:     General: Bowel sounds are normal. There is no distension.     Palpations: Abdomen is soft. Abdomen is not rigid.     Tenderness: There is no abdominal tenderness. There is no guarding or rebound.  Musculoskeletal:        General: Tenderness present. Normal range of motion.     Cervical back: Normal range of motion and  neck supple. No edema, erythema or rigidity. No muscular tenderness. Normal range of motion.     Comments: Bilateral ankle tenderness and right great and second toe tenderness  Heberden's nodes in both distal fingers of left and right hands with discomfort  Lymphadenopathy:     Head:     Right side of head: No submental or submandibular adenopathy.     Left side of head: No submental or submandibular adenopathy.     Cervical: No cervical adenopathy.  Skin:    General: Skin is warm and dry.     Coloration: Skin is not pale.     Findings: No rash.     Nails: There is no clubbing.  Neurological:     Mental Status: She is alert and oriented to person, place, and time.     Sensory: No sensory deficit.  Psychiatric:        Mood and Affect: Mood normal.        Speech: Speech normal.        Behavior: Behavior normal.        Thought Content: Thought content normal.     There were no vitals taken for this visit. Wt Readings from Last 3 Encounters:  02/18/22 129 lb 12.8 oz (58.9 kg)  11/05/21 135 lb 6.4 oz (61.4 kg)  10/07/21 136 lb 11 oz (62 kg)     Health Maintenance Due  Topic Date Due   COVID-19 Vaccine (4 - 2023-24 season) 02/05/2022    There are no preventive care reminders to display for this patient.  Lab Results  Component Value Date   TSH 1.870 07/20/2019   Lab Results  Component Value Date   WBC 5.9 01/21/2022   HGB 14.0 01/21/2022   HCT 40.6 01/21/2022   MCV 104.9 (H)  01/21/2022   PLT 246 01/21/2022   Lab Results  Component Value Date   NA 137 01/21/2022   K 3.5 01/21/2022   CO2 24 01/21/2022   GLUCOSE 88 01/21/2022   BUN 10 01/21/2022   CREATININE 0.58 01/21/2022   BILITOT 0.4 02/18/2022   ALKPHOS 85 02/18/2022   AST 24 02/18/2022   ALT 17 02/18/2022   PROT 7.2 02/18/2022   ALBUMIN 4.5 02/18/2022   CALCIUM 8.8 (L) 01/21/2022   ANIONGAP 7 01/21/2022   EGFR 97 03/24/2021   Lab Results  Component Value Date   CHOL 172 08/04/2021   Lab Results   Component Value Date   HDL 48 08/04/2021   Lab Results  Component Value Date   LDLCALC 100 (H) 08/04/2021   Lab Results  Component Value Date   TRIG 136 08/04/2021   Lab Results  Component Value Date   CHOLHDL 3.6 08/04/2021   Lab Results  Component Value Date   HGBA1C 5.2 08/04/2021      Assessment & Plan:   Problem List Items Addressed This Visit   None No orders of the defined types were placed in this encounter. 40 minutes spent extra time needed because of language barrier  Follow-up: No follow-ups on file.    Asencion Noble, MD

## 2022-07-22 DIAGNOSIS — M25532 Pain in left wrist: Secondary | ICD-10-CM | POA: Insufficient documentation

## 2022-07-22 NOTE — Assessment & Plan Note (Signed)
Continues with hearing loss and ear impaction she agrees to pay the $100 co-pay to go to ENT referral be made

## 2022-07-22 NOTE — Assessment & Plan Note (Signed)
Patient has orange card refer to orthopedics will give meloxicam and told her to take a wrist splint for now

## 2022-07-22 NOTE — Assessment & Plan Note (Signed)
Blood pressure at goal continue losartan

## 2022-07-28 ENCOUNTER — Other Ambulatory Visit: Payer: Self-pay

## 2022-07-28 ENCOUNTER — Other Ambulatory Visit: Payer: Self-pay | Admitting: *Deleted

## 2022-07-28 DIAGNOSIS — Z1231 Encounter for screening mammogram for malignant neoplasm of breast: Secondary | ICD-10-CM

## 2022-07-29 ENCOUNTER — Ambulatory Visit: Payer: Self-pay | Admitting: Orthopaedic Surgery

## 2022-07-30 ENCOUNTER — Other Ambulatory Visit: Payer: Self-pay

## 2022-08-16 ENCOUNTER — Telehealth: Payer: Self-pay | Admitting: Critical Care Medicine

## 2022-08-16 ENCOUNTER — Ambulatory Visit
Admission: RE | Admit: 2022-08-16 | Discharge: 2022-08-16 | Disposition: A | Discharge status: Home / Self Care | Payer: No Typology Code available for payment source | Source: Ambulatory Visit | Attending: Critical Care Medicine | Admitting: Critical Care Medicine

## 2022-08-16 ENCOUNTER — Other Ambulatory Visit: Payer: Self-pay

## 2022-08-16 DIAGNOSIS — Z1231 Encounter for screening mammogram for malignant neoplasm of breast: Secondary | ICD-10-CM

## 2022-08-18 NOTE — Progress Notes (Signed)
Let pt know mammogram normal recheck one year

## 2022-08-19 ENCOUNTER — Telehealth: Payer: Self-pay

## 2022-08-19 NOTE — Telephone Encounter (Signed)
Pt was called and is aware of results, DOB was confirmed.  Interpreter id #lisa [id number]

## 2022-08-19 NOTE — Telephone Encounter (Signed)
-----   Message from Elsie Stain, MD sent at 08/18/2022  3:24 PM EDT ----- Let pt know mammogram normal recheck one year

## 2022-08-26 ENCOUNTER — Ambulatory Visit: Payer: Self-pay

## 2022-08-30 ENCOUNTER — Ambulatory Visit: Payer: Self-pay | Attending: Family Medicine

## 2022-09-01 ENCOUNTER — Telehealth: Payer: Self-pay

## 2022-09-01 DIAGNOSIS — M25532 Pain in left wrist: Secondary | ICD-10-CM

## 2022-09-01 DIAGNOSIS — G8929 Other chronic pain: Secondary | ICD-10-CM

## 2022-09-01 NOTE — Telephone Encounter (Signed)
Copied from [place] [id number]. Topic: Referral - Status >> Sep 01, 2022  3:09 PM Cyndi Bender wrote: Reason for CRM: Pt stated she now has insurance and would like a referral for orthopedic. Pt requests to be contacted in [place].

## 2022-09-02 NOTE — Addendum Note (Signed)
Addended by: Asencion Noble E on: 09/02/2022 01:11 PM   Modules accepted: Orders

## 2022-09-02 NOTE — Telephone Encounter (Signed)
Referral made 

## 2022-09-02 NOTE — Telephone Encounter (Signed)
Called patient and unable to make contact or leave voicemail due to mailbox being full   Interpreter id [phone]

## 2022-09-11 ENCOUNTER — Other Ambulatory Visit: Payer: Self-pay

## 2022-09-11 ENCOUNTER — Encounter (HOSPITAL_COMMUNITY): Payer: Self-pay | Admitting: Emergency Medicine

## 2022-09-11 ENCOUNTER — Ambulatory Visit (HOSPITAL_COMMUNITY)
Admission: EM | Admit: 2022-09-11 | Discharge: 2022-09-11 | Disposition: A | Discharge status: Home / Self Care | Payer: Self-pay | Attending: Family Medicine | Admitting: Family Medicine

## 2022-09-11 DIAGNOSIS — N3000 Acute cystitis without hematuria: Secondary | ICD-10-CM | POA: Insufficient documentation

## 2022-09-11 LAB — POCT URINALYSIS DIPSTICK, ED / UC
Bilirubin Urine: NEGATIVE
Glucose, UA: NEGATIVE mg/dL
Ketones, ur: NEGATIVE mg/dL
Nitrite: POSITIVE — AB
Protein, ur: 30 mg/dL — AB
Specific Gravity, Urine: 1.015 (ref 1.005–1.030)
Urobilinogen, UA: 1 mg/dL (ref 0.0–1.0)
pH: 8.5 — ABNORMAL HIGH (ref 5.0–8.0)

## 2022-09-11 MED ORDER — NITROFURANTOIN MONOHYD MACRO 100 MG PO CAPS: 100.0000 mg | ORAL_CAPSULE | Freq: Two times a day (BID) | ORAL | 0 refills | Status: DC

## 2022-09-11 NOTE — Discharge Instructions (Addendum)
Advised to take the over-the-counter medication that helps with the burning and discomfort. Advised take the Macrobid every 12 hours till completed as this will treat and resolve the bladder infection.  Advised to ensure that you are drinking plenty of fluids throughout the day in order to flush the urinary system.  Advised follow-up PCP return to urgent care as needed.

## 2022-09-11 NOTE — ED Triage Notes (Signed)
Painful urination for 3 days.  Abdominal pain with urination has been taking phenazopyridine for 3 days, but not helping

## 2022-09-11 NOTE — ED Provider Notes (Signed)
[facility]    CSN: [id number] Arrival date & time: 09/11/22  1051      History   Chief Complaint Chief Complaint  Patient presents with   Urinary Tract Infection    HPI Claudia Hernandez is a 72 y.o. female.   72 year old female presents with frequency and dysuria.  History is being taken through son being the interpreter with this language being Spanish.  Patient indicates for the past 3 days she has been having increasing frequency, urgency, dysuria.  She also indicates having mild lower abdominal pain and discomfort.  She has been taking some OTC Urised that has helped relieve the discomfort but it has not resolved her symptoms.  She indicates she has had some mild lower back pain.  She is without fever, nausea or vomiting.  She is tolerating fluids well.   Urinary Tract Infection Associated symptoms: abdominal pain (suprapubic pain)     Past Medical History:  Diagnosis Date   Arthritis    Asthma    hx of   Chronic diffuse otitis externa of both ears 08/04/2021   Heart murmur    unsure of this, but was told she had one by MD in [place]   Hypertension     Patient Active Problem List   Diagnosis Date Noted   Wrist pain, acute, left 07/22/2022   Statin intolerance 02/19/2022   Arthritis of finger of left hand 02/18/2022   Elevated liver enzymes 02/18/2022   SND (sensory-neural deafness), asymmetrical 02/18/2022   Bilateral knee pain 03/24/2021   Elevated LDL cholesterol level 06/17/2020   Bilateral hearing loss due to cerumen impaction 06/16/2020   Toe pain, right 05/13/2020   Essential hypertension 05/13/2020    Past Surgical History:  Procedure Laterality Date   BLADDER SURGERY     CESAREAN SECTION     CHOLECYSTECTOMY     gallbladder removed     left foot surgery      OB History   No obstetric history on file.      Home Medications    Prior to Admission medications   Medication Sig Start Date End Date Taking? Authorizing  Provider  nitrofurantoin, macrocrystal-monohydrate, (MACROBID) 100 MG capsule Take 1 capsule (100 mg total) by mouth 2 (two) times daily. 09/11/22  Yes Ellsworth LennoxJames, Twila Rappa, PA-C  ACETAMINOPHEN PO Take by mouth. PRN    [provider]  COLLAGEN PO Take by mouth.    [provider]  ezetimibe (ZETIA) 10 MG tablet Take 1 tablet (10 mg total) by mouth daily. 07/21/22   Storm FriskWright, Patrick E, MD  losartan (COZAAR) 100 MG tablet Take 1 tablet (100 mg total) by mouth daily. 07/21/22   Storm FriskWright, Patrick E, MD  meloxicam (MOBIC) 7.5 MG tablet Take 1 tablet (7.5 mg total) by mouth daily. 07/21/22   Storm FriskWright, Patrick E, MD    Family History Family History  Problem Relation Age of Onset   Drug abuse Neg Hx    Hypertension Neg Hx    Colon cancer Neg Hx    Esophageal cancer Neg Hx    Rectal cancer Neg Hx    Stomach cancer Neg Hx    Breast cancer Neg Hx     Social History Social History   Tobacco Use   Smoking status: Never   Smokeless tobacco: Never  Vaping Use   Vaping Use: Never used  Substance Use Topics   Alcohol use: Not Currently   Drug use: Never     Allergies  Statins   Review of Systems Review of Systems  Gastrointestinal:  Positive for abdominal pain (suprapubic pain).     Physical Exam Triage Vital Signs ED Triage Vitals  Enc Vitals Group     BP 09/11/22 1106 122/75     Pulse Rate 09/11/22 1106 79     Resp 09/11/22 1106 20     Temp 09/11/22 1106 98.3 F (36.8 C)     Temp Source 09/11/22 1106 Oral     SpO2 09/11/22 1106 95 %     Weight --      Height --      Head Circumference --      Peak Flow --      Pain Score 09/11/22 1104 9     Pain Loc --      Pain Edu? --      Excl. in GC? --    No data found.  Updated Vital Signs BP 122/75 (BP Location: Right Arm)   Pulse 79   Temp 98.3 F (36.8 C) (Oral)   Resp 20   SpO2 95%   Visual Acuity Right Eye Distance:   Left Eye Distance:   Bilateral Distance:    Right Eye Near:   Left Eye Near:     Bilateral Near:     Physical Exam Constitutional:      Appearance: Normal appearance.  Abdominal:     General: Abdomen is flat. Bowel sounds are normal.     Palpations: Abdomen is soft.     Tenderness: There is abdominal tenderness in the suprapubic area. There is right CVA tenderness (mild) and left CVA tenderness (mild). There is no guarding or rebound.  Neurological:     Mental Status: She is alert.      UC Treatments / Results  Labs (all labs ordered are listed, but only abnormal results are displayed) Labs Reviewed  POCT URINALYSIS DIPSTICK, ED / UC - Abnormal; Notable for the following components:      Result Value   Hgb urine dipstick LARGE (*)    pH 8.5 (*)    Protein, ur 30 (*)    Nitrite POSITIVE (*)    Leukocytes,Ua MODERATE (*)    All other components within normal limits  URINE CULTURE    EKG   Radiology No results found.  Procedures Procedures (including critical care time)  Medications Ordered in UC Medications - No data to display  Initial Impression / Assessment and Plan / UC Course  I have reviewed the triage vital signs and the nursing notes.  Pertinent labs & imaging results that were available during my care of the patient were reviewed by me and considered in my medical decision making (see chart for details).    Plan: The diagnosis will be treated with the following: 1.  Acute cystitis without hematuria: A.  Macrobid every 12 hours until completed to treat infection. B.  Urine culture is pending. 2.  Advised follow-up PCP return to urgent care as needed. Final Clinical Impressions(s) / UC Diagnoses   Final diagnoses:  Acute cystitis without hematuria     Discharge Instructions      Advised to take the over-the-counter medication that helps with the burning and discomfort. Advised take the Macrobid every 12 hours till completed as this will treat and resolve the bladder infection.  Advised to ensure that you are drinking  plenty of fluids throughout the day in order to flush the urinary system.  Advised follow-up PCP return to urgent care  as needed.    ED Prescriptions     Medication Sig Dispense Auth. Provider   nitrofurantoin, macrocrystal-monohydrate, (MACROBID) 100 MG capsule Take 1 capsule (100 mg total) by mouth 2 (two) times daily. 14 capsule Ellsworth LennoxJames, Mileydi Milsap, PA-C      PDMP not reviewed this encounter.   Ellsworth LennoxJames, Shante Archambeault, PA-C 09/11/22 1125

## 2022-09-13 LAB — URINE CULTURE: Culture: 20000 — AB

## 2022-09-17 ENCOUNTER — Telehealth: Payer: Self-pay | Admitting: Critical Care Medicine

## 2022-09-17 NOTE — Telephone Encounter (Signed)
Copied from CRM [id number]. Topic: Referral - Request for Referral >> Sep 17, 2022  2:36 PM Franchot Heidelberg wrote: Has patient seen PCP for this complaint? Yes.   *If NO, is insurance requiring patient see PCP for this issue before PCP can refer them? Referral for which specialty: Ear, nose, throat Preferred provider/office: Highest recommended  Reason for referral: Says she needs her ears cleaned

## 2022-09-20 NOTE — Telephone Encounter (Signed)
I sent a referral 07/21/22 Dr Christia Reading [facility]  Arna Medici do you know what happened?

## 2022-09-21 ENCOUNTER — Ambulatory Visit (INDEPENDENT_AMBULATORY_CARE_PROVIDER_SITE_OTHER): Payer: Self-pay | Admitting: Orthopaedic Surgery

## 2022-09-21 ENCOUNTER — Encounter (HOSPITAL_COMMUNITY): Payer: Self-pay

## 2022-09-21 ENCOUNTER — Encounter: Payer: Self-pay | Admitting: Orthopaedic Surgery

## 2022-09-21 ENCOUNTER — Other Ambulatory Visit (INDEPENDENT_AMBULATORY_CARE_PROVIDER_SITE_OTHER): Payer: Self-pay

## 2022-09-21 ENCOUNTER — Ambulatory Visit (HOSPITAL_COMMUNITY)
Admission: RE | Admit: 2022-09-21 | Discharge: 2022-09-21 | Disposition: A | Discharge status: Home / Self Care | Payer: Self-pay | Source: Ambulatory Visit | Attending: Physician Assistant | Admitting: Physician Assistant

## 2022-09-21 VITALS — BP 136/78 | HR 67 | Temp 98.6°F | Resp 16

## 2022-09-21 DIAGNOSIS — H01132 Eczematous dermatitis of right lower eyelid: Secondary | ICD-10-CM

## 2022-09-21 DIAGNOSIS — M19132 Post-traumatic osteoarthritis, left wrist: Secondary | ICD-10-CM

## 2022-09-21 DIAGNOSIS — H01135 Eczematous dermatitis of left lower eyelid: Secondary | ICD-10-CM

## 2022-09-21 DIAGNOSIS — M25532 Pain in left wrist: Secondary | ICD-10-CM

## 2022-09-21 DIAGNOSIS — H01131 Eczematous dermatitis of right upper eyelid: Secondary | ICD-10-CM

## 2022-09-21 DIAGNOSIS — H01134 Eczematous dermatitis of left upper eyelid: Secondary | ICD-10-CM

## 2022-09-21 DIAGNOSIS — H1013 Acute atopic conjunctivitis, bilateral: Secondary | ICD-10-CM

## 2022-09-21 MED ORDER — OLOPATADINE HCL 0.1 % OP SOLN
1.0000 [drp] | Freq: Two times a day (BID) | OPHTHALMIC | 0 refills | Status: DC
  Filled 2022-09-21: qty 5, 25d supply, fill #0

## 2022-09-21 MED ORDER — METHYLPREDNISOLONE ACETATE 40 MG/ML IJ SUSP
40.0000 mg | INTRAMUSCULAR | Status: AC | PRN
  Administered 2022-09-21: 40 mg via INTRA_ARTICULAR

## 2022-09-21 MED ORDER — BUPIVACAINE HCL 0.5 % IJ SOLN
1.0000 mL | INTRAMUSCULAR | Status: AC | PRN
  Administered 2022-09-21: 1 mL via INTRA_ARTICULAR

## 2022-09-21 MED ORDER — HYDROCORTISONE 0.5 % EX CREA
1.0000 | TOPICAL_CREAM | Freq: Two times a day (BID) | CUTANEOUS | 0 refills | Status: DC
  Filled 2022-09-21: qty 30, 15d supply, fill #0

## 2022-09-21 MED ORDER — LIDOCAINE HCL 1 % IJ SOLN
1.0000 mL | INTRAMUSCULAR | Status: AC | PRN
  Administered 2022-09-21: 1 mL

## 2022-09-21 NOTE — ED Triage Notes (Signed)
Claudia Hernandez [id number]  Pt states that her allergies are making her eyes swollen. Takes allergy meds. Pt states that her vision is fine.

## 2022-09-21 NOTE — ED Provider Notes (Signed)
[facility]    CSN: [id number] Arrival date & time: 09/21/22  1840      History   Chief Complaint Chief Complaint  Patient presents with   Eye Problem    HPI Claudia Hernandez is a 72 y.o. female.   Patient presents today with a 1 to 2-week history of burning, redness, irritation surrounding bilateral eyes.  She is Spanish-speaking and interpreter was utilized during visit.  She denies any changes to her personal hygiene products including cosmetics.  She has had similar symptoms in the past several years ago and was given ophthalmic drops with improvement of symptoms.  She has tried over-the-counter antihistamine drops without improvement of symptoms.  She denies any ocular trauma, visual disturbance, headache, dizziness, exposure to find particulate matter or chemicals.  She does not wear glasses or contacts.  She denies any significant URI symptoms including cough, congestion.  She reports that the rash around her eyes is intensely pruritic and is preventing her from doing her normal activities.    Past Medical History:  Diagnosis Date   Arthritis    Asthma    hx of   Chronic diffuse otitis externa of both ears 08/04/2021   Heart murmur    unsure of this, but was told she had one by MD in [place]   Hypertension     Patient Active Problem List   Diagnosis Date Noted   Wrist pain, acute, left 07/22/2022   Statin intolerance 02/19/2022   Arthritis of finger of left hand 02/18/2022   Elevated liver enzymes 02/18/2022   SND (sensory-neural deafness), asymmetrical 02/18/2022   Bilateral knee pain 03/24/2021   Elevated LDL cholesterol level 06/17/2020   Bilateral hearing loss due to cerumen impaction 06/16/2020   Toe pain, right 05/13/2020   Essential hypertension 05/13/2020    Past Surgical History:  Procedure Laterality Date   BLADDER SURGERY     CESAREAN SECTION     CHOLECYSTECTOMY     gallbladder removed     left foot surgery      OB History    No obstetric history on file.      Home Medications    Prior to Admission medications   Medication Sig Start Date End Date Taking? Authorizing Provider  COLLAGEN PO Take by mouth.   Yes [provider]  ezetimibe (ZETIA) 10 MG tablet Take 1 tablet (10 mg total) by mouth daily. 07/21/22  Yes Storm Frisk, MD  hydrocortisone cream 0.5 % Apply 1 Application topically 2 (two) times daily. 09/21/22  Yes Kenzlei Runions K, PA-C  losartan (COZAAR) 100 MG tablet Take 1 tablet (100 mg total) by mouth daily. 07/21/22  Yes Storm Frisk, MD  meloxicam (MOBIC) 7.5 MG tablet Take 1 tablet (7.5 mg total) by mouth daily. 07/21/22  Yes Storm Frisk, MD  nitrofurantoin, macrocrystal-monohydrate, (MACROBID) 100 MG capsule Take 1 capsule (100 mg total) by mouth 2 (two) times daily. 09/11/22  Yes Ellsworth Lennox, PA-C  olopatadine (PATADAY) 0.1 % ophthalmic solution Place 1 drop into both eyes 2 (two) times daily. 09/21/22  Yes Zayne Marovich K, PA-C  ACETAMINOPHEN PO Take by mouth. PRN    [provider]    Family History Family History  Problem Relation Age of Onset   Drug abuse Neg Hx    Hypertension Neg Hx    Colon cancer Neg Hx    Esophageal cancer Neg Hx    Rectal cancer Neg Hx    Stomach cancer  Neg Hx    Breast cancer Neg Hx     Social History Social History   Tobacco Use   Smoking status: Never   Smokeless tobacco: Never  Vaping Use   Vaping Use: Never used  Substance Use Topics   Alcohol use: Not Currently   Drug use: Never     Allergies   Statins   Review of Systems Review of Systems  Constitutional:  Positive for activity change. Negative for appetite change, fatigue and fever.  HENT:  Negative for congestion.   Eyes:  Positive for discharge (Tearing), redness and itching. Negative for photophobia, pain and visual disturbance.  Respiratory:  Negative for cough.   Gastrointestinal:  Negative for abdominal pain, diarrhea, nausea and vomiting.  Skin:   Positive for rash.  Neurological:  Negative for dizziness, light-headedness and headaches.     Physical Exam Triage Vital Signs ED Triage Vitals  Enc Vitals Group     BP 09/21/22 1908 136/78     Pulse Rate 09/21/22 1908 67     Resp 09/21/22 1908 16     Temp 09/21/22 1908 98.6 F (37 C)     Temp Source 09/21/22 1908 Oral     SpO2 09/21/22 1908 94 %     Weight --      Height --      Head Circumference --      Peak Flow --      Pain Score 09/21/22 1907 10     Pain Loc --      Pain Edu? --      Excl. in GC? --    No data found.  Updated Vital Signs BP 136/78 (BP Location: Right Arm)   Pulse 67   Temp 98.6 F (37 C) (Oral)   Resp 16   SpO2 94%   Visual Acuity Right Eye Distance:   Left Eye Distance:   Bilateral Distance:    Right Eye Near:   Left Eye Near:    Bilateral Near:     Physical Exam Vitals reviewed.  Constitutional:      General: She is awake. She is not in acute distress.    Appearance: Normal appearance. She is well-developed. She is not ill-appearing.     Comments: Very pleasant female appears stated age in no acute distress sitting comfortably in exam room  HENT:     Head: Normocephalic and atraumatic.     Right Ear: Tympanic membrane, ear canal and external ear normal. Tympanic membrane is not erythematous or bulging.     Left Ear: Tympanic membrane, ear canal and external ear normal. Tympanic membrane is not erythematous or bulging.     Nose:     Right Sinus: No maxillary sinus tenderness or frontal sinus tenderness.     Left Sinus: No maxillary sinus tenderness or frontal sinus tenderness.     Mouth/Throat:     Pharynx: Uvula midline. No oropharyngeal exudate or posterior oropharyngeal erythema.  Eyes:     Extraocular Movements: Extraocular movements intact.     Conjunctiva/sclera:     Right eye: Right conjunctiva is injected.     Left eye: Left conjunctiva is injected.     Pupils: Pupils are equal, round, and reactive to light.      Comments: Erythematous and scaly rash noted upper and lower eyelids bilaterally.  Cardiovascular:     Rate and Rhythm: Normal rate and regular rhythm.     Heart sounds: Normal heart sounds, S1 normal and S2 normal. No  murmur heard. Pulmonary:     Effort: Pulmonary effort is normal.     Breath sounds: Normal breath sounds. No wheezing, rhonchi or rales.     Comments: Clear to auscultation bilaterally Skin:    Findings: Rash present. Rash is macular and scaling.     Comments: Erythematous macular rash with associated scale noted upper and lower eyelids.  Psychiatric:        Behavior: Behavior is cooperative.      UC Treatments / Results  Labs (all labs ordered are listed, but only abnormal results are displayed) Labs Reviewed - No data to display  EKG   Radiology XR Wrist Complete Left  Result Date: 09/21/2022 Posttraumatic changes consistent with wound old healed ulnar styloid fracture.  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   Procedures Procedures (including critical care time)  Medications Ordered in UC Medications - No data to display  Initial Impression / Assessment and Plan / UC Course  I have reviewed the triage vital signs and the nursing notes.  Pertinent labs & imaging results that were available during my care of the patient were reviewed by me and considered in my medical decision making (see chart for details).     Patient denied any actual eye symptoms and reports that most of her symptoms are related to the irritating rash around her eyes.  Fluorescein staining was deferred as she denies any recent trauma or foreign body sensation.  She was started on hydrocortisone 0.5% up to twice a day for 1 to 2 weeks to treat eczematous dermatitis of both eyelids.  Recommended she use hypoallergenic soaps and detergents.  She is to avoid use of cosmetics until symptoms resolve in case there is an allergic component from this.  Will also prescribe Pataday drops to  help with any allergic conjunctivitis symptoms.  Discussed that if her symptoms are not improving within a few days or if at any point anything worsens she needs to be seen immediately.  Recommend close follow-up with her primary care within the week.  Strict return precautions given to which she expressed understanding.  Final Clinical Impressions(s) / UC Diagnoses   Final diagnoses:  Eczematous dermatitis of upper and lower eyelids of both eyes  Allergic conjunctivitis of both eyes     Discharge Instructions      Use crema de hidrocortisona [employer] al da. Trate de evitar que esto entre en contacto con los ojos. Como comentamos, esto slo debe usarse durante 1 a 2 semanas. No utilice ningn cosmtico. Asegrese de Corliss Blacker y detergentes hipoalergnicos. Tambin me gustara que comenzara con gotas para los ojos para ayudar con los sntomas de la Programmer, multimedia. Si tiene algn sntoma que cambia o Victor, incluido un aumento del enrojecimiento o la propagacin del sarpullido, dificultad para ver, fiebre o [employer] ojos, debe ser atendido de inmediato.     ED Prescriptions     Medication Sig Dispense Auth. Provider   olopatadine (PATADAY) 0.1 % ophthalmic solution Place 1 drop into both eyes 2 (two) times daily. 5 mL Michel Hendon K, PA-C   hydrocortisone cream 0.5 % Apply 1 Application topically 2 (two) times daily. 30 g Zhana Jeangilles, Noberto Retort, PA-C      PDMP not reviewed this encounter.   Jeani Hawking, PA-C 09/21/22 1958

## 2022-09-21 NOTE — Discharge Instructions (Signed)
Use crema de hidrocortisona [employer] al da. Trate de evitar que esto entre en contacto con los ojos. Como comentamos, esto slo debe usarse durante 1 a 2 semanas. No utilice ningn cosmtico. Asegrese de Corliss Blacker y detergentes hipoalergnicos. Tambin me gustara que comenzara con gotas para los ojos para ayudar con los sntomas de la Programmer, multimedia. Si tiene algn sntoma que cambia o Charlo, incluido un aumento del enrojecimiento o la propagacin del sarpullido, dificultad para ver, fiebre o [employer] ojos, debe ser atendido de inmediato.

## 2022-09-21 NOTE — Progress Notes (Signed)
Office Visit Note   Patient: Claudia Hernandez           Date of Birth: 05/28/1951           MRN: [id number] Visit Date: 09/21/2022              Requested by: Storm Frisk, MD [address] [place],  [place] [address] PCP: Storm Frisk, MD   Assessment & Plan: Visit Diagnoses:  1. Pain in left wrist   2. 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  Based on treatment options patient elected to undergo intra-articular wrist injection.  We will also immobilize for short period of time with a wrist brace.  If symptoms persist she should follow-up.  Interpreter present today.  Follow-Up Instructions: No follow-ups on file.   Orders:  Orders Placed This Encounter  Procedures   XR Wrist Complete Left   No orders of the defined types were placed in this encounter.     Procedures: Medium Joint Inj: L radiocarpal on 09/21/2022 4:35 PM Indications: pain Details: 25 G needle, dorsal approach Medications: 1 mL lidocaine 1 %; 1 mL bupivacaine 0.5 %; 40 mg methylPREDNISolone acetate 40 MG/ML Outcome: tolerated well, no immediate complications Patient was prepped and draped in the usual sterile fashion.       Clinical Data: No additional findings.   Subjective: Chief Complaint  Patient presents with   Left Wrist - Pain    HPI  Claudia Hernandez is a 72 year old Hispanic female here with her son and interpreter for chronic left wrist pain.  She has had this for 3 months.  Denies a history of gout.  Review of Systems  Constitutional: Negative.   HENT: Negative.    Eyes: Negative.   Respiratory: Negative.    Cardiovascular: Negative.   Endocrine: Negative.   Musculoskeletal: Negative.   Neurological: Negative.   Hematological: Negative.   Psychiatric/Behavioral: Negative.    All other systems reviewed and are  negative.    Objective: Vital Signs: There were no vitals taken for this visit.  Physical Exam Vitals and nursing note reviewed.  Constitutional:      Appearance: She is well-developed.  HENT:     Head: Atraumatic.     Nose: Nose normal.  Eyes:     Extraocular Movements: Extraocular movements intact.  Cardiovascular:     Pulses: Normal pulses.  Pulmonary:     Effort: Pulmonary effort is normal.  Abdominal:     Palpations: Abdomen is soft.  Musculoskeletal:     Cervical back: Neck supple.  Skin:    General: Skin is warm.     Capillary Refill: Capillary refill takes less than 2 seconds.  Neurological:     Mental Status: She is alert. Mental status is at baseline.  Psychiatric:        Behavior: Behavior normal.        Thought Content: Thought content normal.        Judgment: Judgment normal.     Ortho Exam  Examination of the left wrist shows mild swelling throughout the wrist more so on the ulnar side.  She has pain with touch and guards to range of motion.  No neurovascular compromise.  Specialty Comments:  No specialty comments available.  Imaging: No results found.   PMFS History: Patient Active Problem List   Diagnosis Date Noted  Wrist pain, acute, left 07/22/2022   Statin intolerance 02/19/2022   Arthritis of finger of left hand 02/18/2022   Elevated liver enzymes 02/18/2022   SND (sensory-neural deafness), asymmetrical 02/18/2022   Bilateral knee pain 03/24/2021   Elevated LDL cholesterol level 06/17/2020   Bilateral hearing loss due to cerumen impaction 06/16/2020   Toe pain, right 05/13/2020   Essential hypertension 05/13/2020   Past Medical History:  Diagnosis Date   Arthritis    Asthma    hx of   Chronic diffuse otitis externa of both ears 08/04/2021   Heart murmur    unsure of this, but was told she had one by MD in [place]   Hypertension     Family History  Problem Relation Age of Onset   Drug abuse Neg Hx    Hypertension Neg Hx     Colon cancer Neg Hx    Esophageal cancer Neg Hx    Rectal cancer Neg Hx    Stomach cancer Neg Hx    Breast cancer Neg Hx     Past Surgical History:  Procedure Laterality Date   BLADDER SURGERY     CESAREAN SECTION     CHOLECYSTECTOMY     gallbladder removed     left foot surgery     Social History   Occupational History   Not on file  Tobacco Use   Smoking status: Never   Smokeless tobacco: Never  Vaping Use   Vaping Use: Never used  Substance and Sexual Activity   Alcohol use: Not Currently   Drug use: Never   Sexual activity: Not on file

## 2022-09-22 ENCOUNTER — Other Ambulatory Visit: Payer: Self-pay

## 2022-09-22 NOTE — Telephone Encounter (Signed)
She needs ent audiology cannot help her   Where else can we send her  Let her know she has to pay that , no free ent care available even with orange card

## 2022-09-23 NOTE — Telephone Encounter (Signed)
noted 

## 2022-09-24 ENCOUNTER — Telehealth: Payer: Self-pay | Admitting: Radiology

## 2022-09-24 MED ORDER — HYDROCODONE-ACETAMINOPHEN 5-325 MG PO TABS: 1.0000 | ORAL_TABLET | Freq: Four times a day (QID) | ORAL | 0 refills | Status: DC | PRN

## 2022-09-24 NOTE — Telephone Encounter (Signed)
I called patient with interpreter services. There was no answer and her voicemail is not set up.  I called patient's daughter who did not answer and voicemail did not pick up.

## 2022-09-24 NOTE — Telephone Encounter (Signed)
It's unreasonable for her to say that she would have opted for the cream had she known she could have had a post injection flare since she has the benefit of hindsight.  Additionally, there's no guarantee that the cream would have worked.  She specifically asked me which one I recommended and thought would work best and she chose the injection because she valued my opinion.  It's unfortunate she had a flare from the injection but most people don't.  I've sent norco.

## 2022-09-24 NOTE — Telephone Encounter (Signed)
Patient called triage line through interpreter very upset. She states that she got injection 09/21/2022 (wrist injection). She chose to do this over the cream as she thought the pain would go away. She had severe pain an hour after the injection. This pain lasted into the next day. She is very upset that we did not explain to her what to expect after the injection or give her a print out. She states if she knew of the secondary effects she would have, she would not have opted for the injection.  I explained to patient that occasionally one could experience pain after the numbing medication wears off and that it could take 2 days for cortisone to start to work. I also explained that medication could take up to 2 weeks to reach maximum benefit.  Pain is not as bad as it was post injection, however, she still complains of 7/10 pain in the wrist. She states that this is worse than it was prior to injection.  Patient asks that she is called with interpreter to explain why we did not tell her of the expectations post injection because she would not have had it. She is also requesting something for pain, but will have to check with her son to find out where he can pick up medication.   CB  [phone]

## 2022-09-24 NOTE — Addendum Note (Signed)
Addended by: Mayra Reel on: 09/24/2022 03:24 PM   Modules accepted: Orders

## 2022-09-28 ENCOUNTER — Telehealth (HOSPITAL_COMMUNITY): Payer: Self-pay | Admitting: *Deleted

## 2022-09-28 ENCOUNTER — Other Ambulatory Visit: Payer: Self-pay

## 2022-09-28 NOTE — Telephone Encounter (Signed)
FYI  I have tried to reach patient and her daughter x 2. No voicemail is set up for either.

## 2022-09-28 NOTE — Telephone Encounter (Signed)
pt son called asking if rx can be transferred to [facility] ON [address] contact number [phone]    Looks like pt was last seen on 09/21/2022, called and LMTCB for son so I know what rx needs to be changed over to [facility].

## 2022-10-24 IMAGING — CT CT ABD-PELV W/ CM
2 of 5 series · 16 of 46 positions shown, 18 images · IV contrast (APPLIED)
Comparison: None Available.

CLINICAL DATA: Abdomen pain emesis

EXAM:
CT ABDOMEN AND PELVIS WITH CONTRAST
TECHNIQUE: Multidetector CT imaging of the abdomen and pelvis was performed
using the standard protocol following bolus administration of
intravenous contrast.

[Series 2: abd/ pelvis 5.0 i30f 2 · axial · 0.80mm/px · z∈[-942,-562]mm · 13 of 86 slices shown, 15 images]
[im 5/86  soft-tissue]
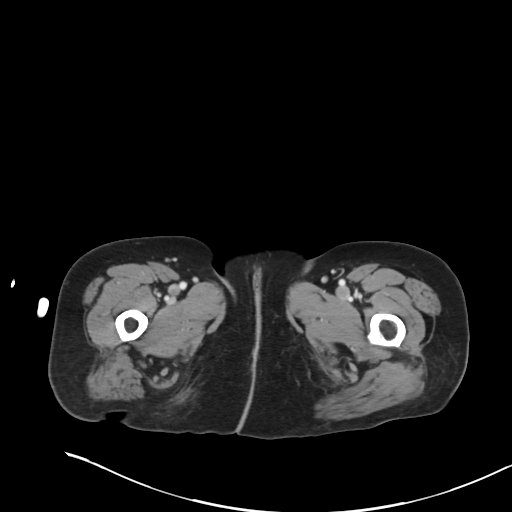
[im 5/86  bone]
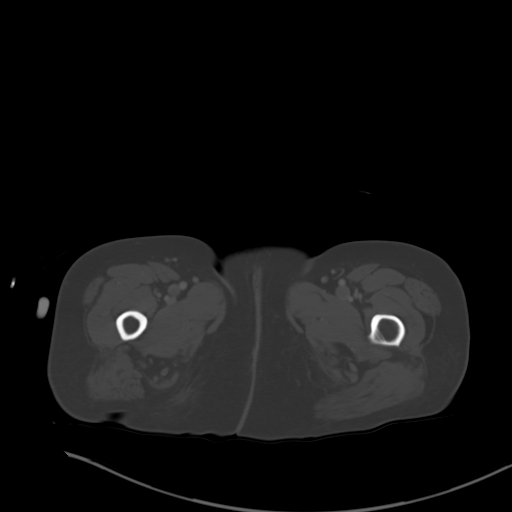
[im 10/86  soft-tissue]
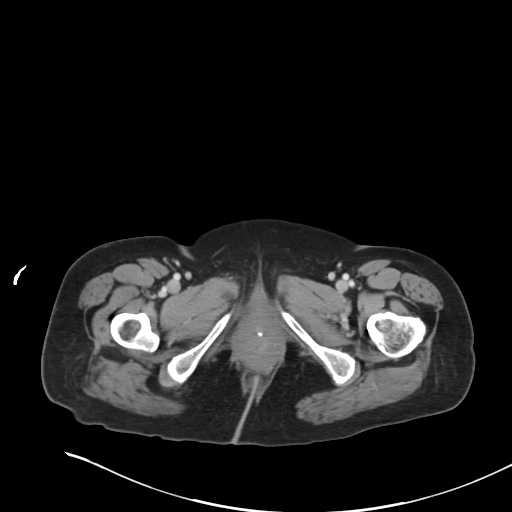
[im 19/86  soft-tissue]
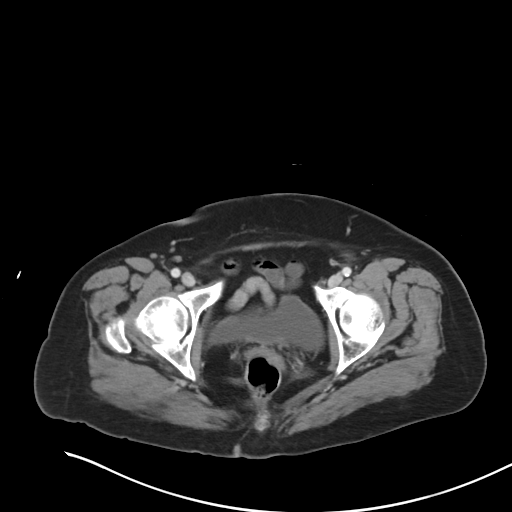
[im 24/86  soft-tissue]
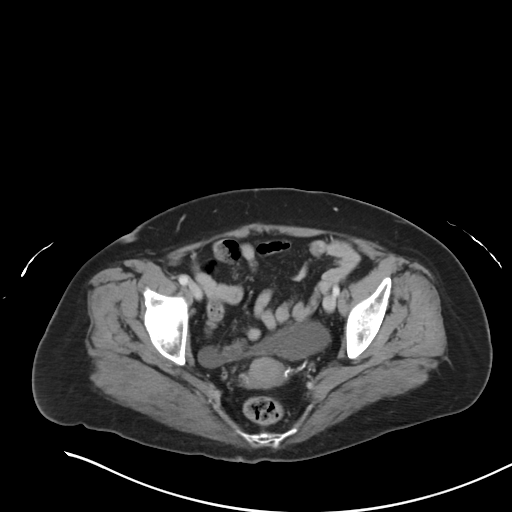
[im 29/86  soft-tissue]
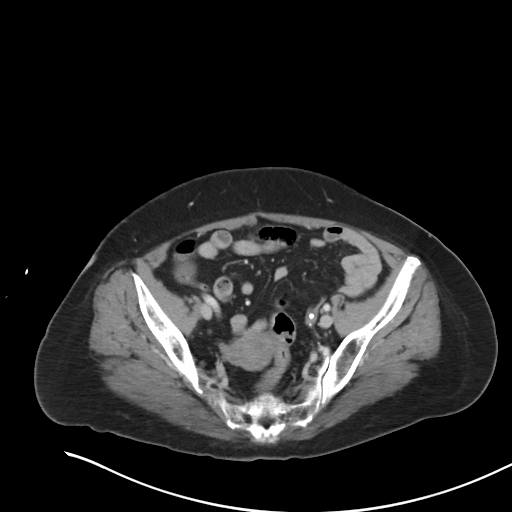
[im 38/86  soft-tissue]
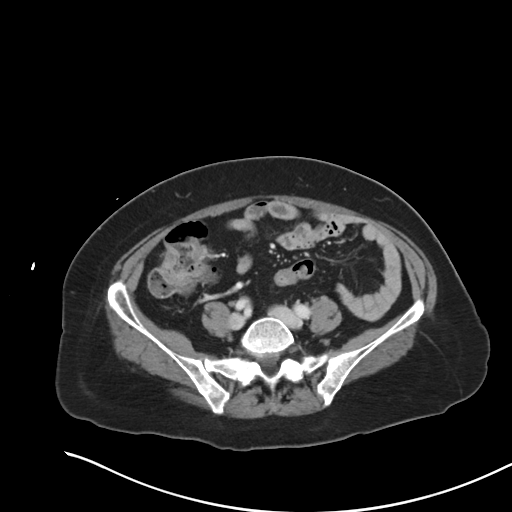
[im 43/86  soft-tissue]
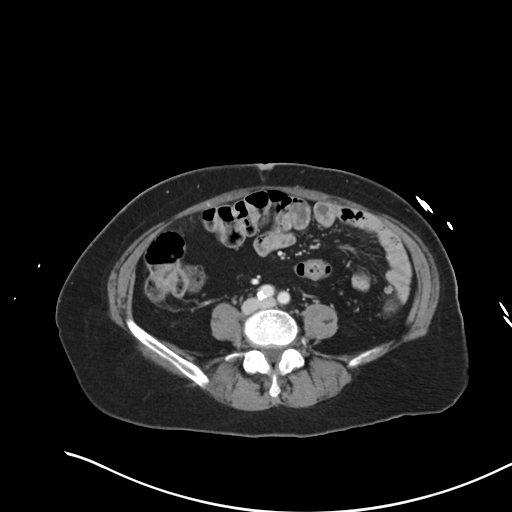
[im 48/86  soft-tissue]
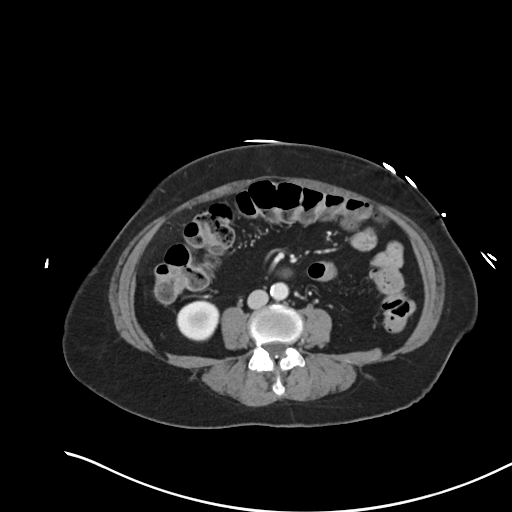
[im 57/86  soft-tissue]
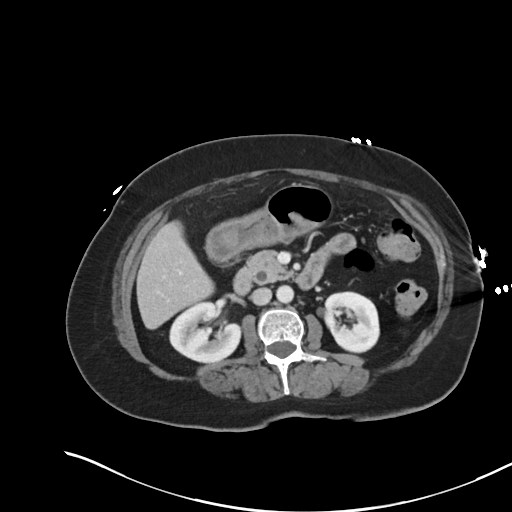
[im 57/86  bone]
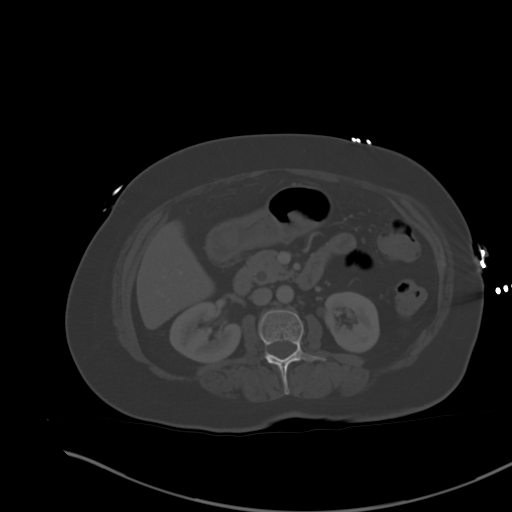
[im 62/86  soft-tissue]
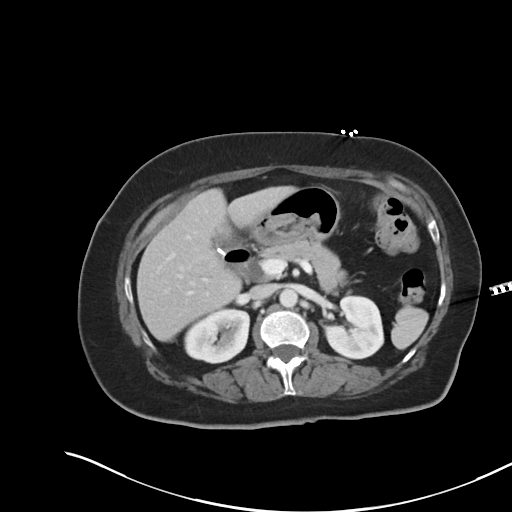
[im 67/86  soft-tissue]
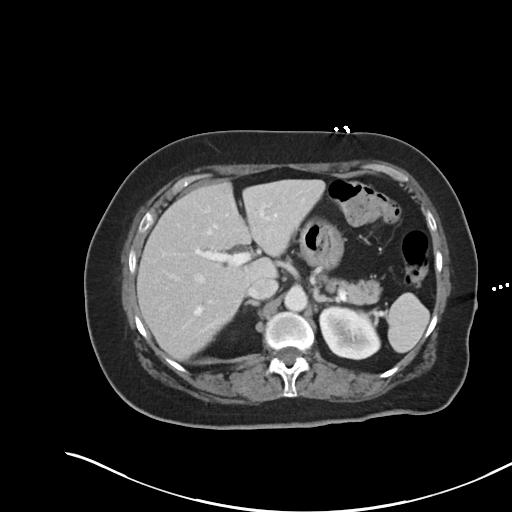
[im 76/86  soft-tissue]
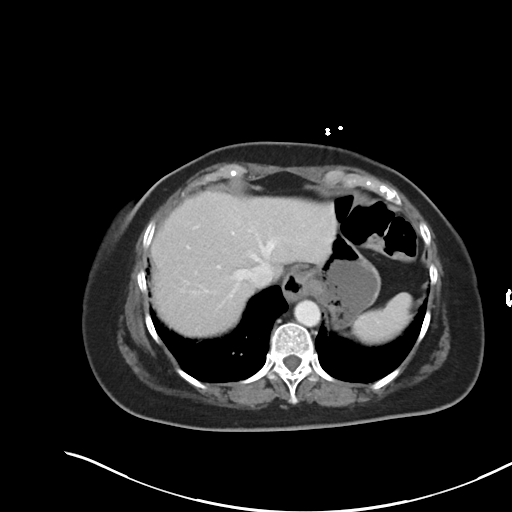
[im 81/86  soft-tissue]
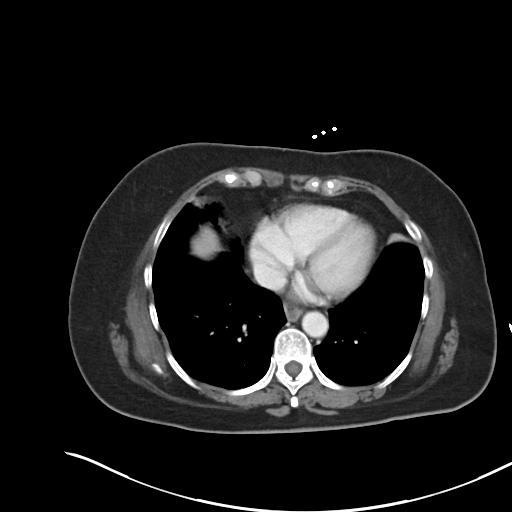

[Series 5: coronal soft tissue · coronal · 0.66mm/px · 3 of 101 slices shown]
[im 34/101  soft-tissue]
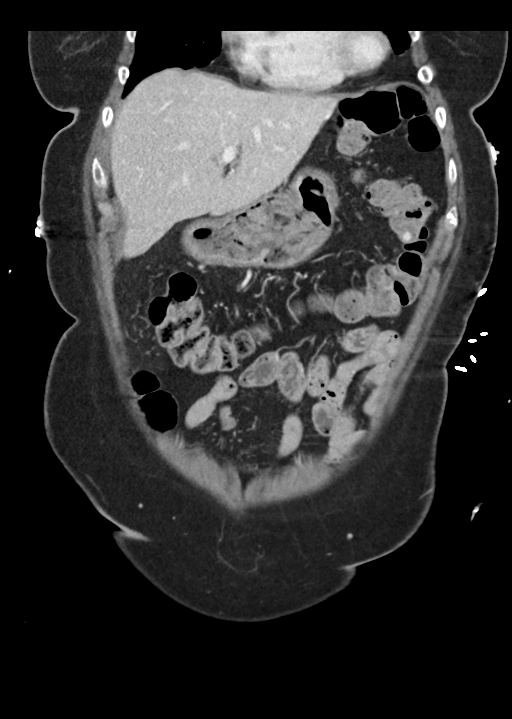
[im 45/101  soft-tissue]
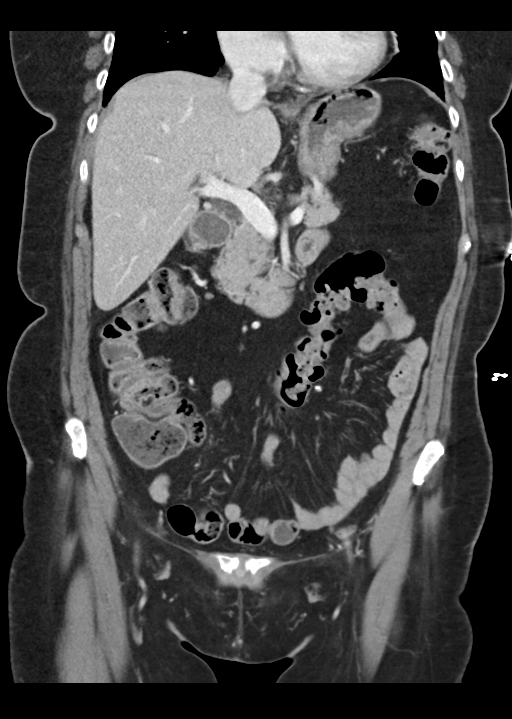
[im 56/101  soft-tissue]
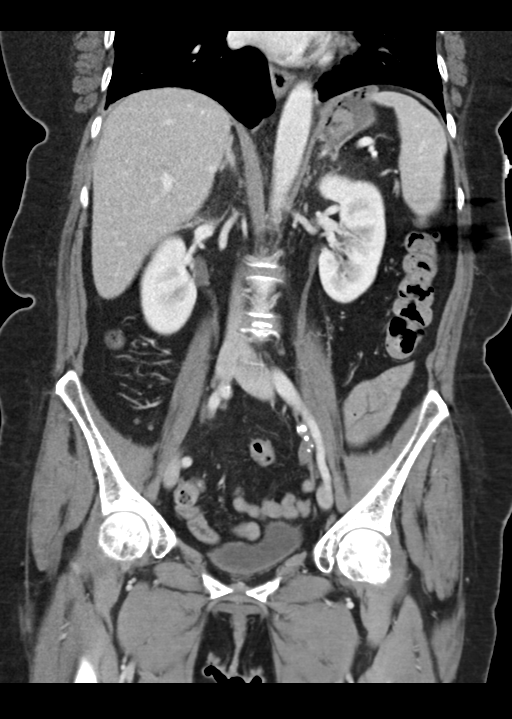

[16 of 46 positions shown; findings below may reference images not displayed]

RADIATION DOSE REDUCTION: This exam was performed according to the
departmental dose-optimization program which includes automated
exposure control, adjustment of the mA and/or kV according to
patient size and/or use of iterative reconstruction technique.

CONTRAST:  100mL OMNIPAQUE IOHEXOL 300 MG/ML  SOLN
FINDINGS: Lower chest: Lung bases demonstrate no acute consolidation or
effusion. Borderline cardiomegaly. Small distal esophageal hiatal
hernia.

Hepatobiliary: Status post cholecystectomy. No focal hepatic
abnormality. Prominent extrahepatic common bile duct measuring up to
9 mm likely due to postsurgical change.

Pancreas: Unremarkable. No pancreatic ductal dilatation or
surrounding inflammatory changes.

Spleen: Normal in size without focal abnormality.

Adrenals/Urinary Tract: Adrenal glands are normal. Subcentimeter
hypodense renal lesions too small to further characterize. No
hydronephrosis. The bladder is unremarkable

Stomach/Bowel: Stomach is within normal limits. Appendix appears
normal. No evidence of bowel wall thickening, distention, or
inflammatory changes.

Vascular/Lymphatic: Mild atherosclerosis. No aneurysm. No suspicious
lymph nodes.

Reproductive: Uterus and bilateral adnexa are unremarkable.

Other: Negative for pelvic effusion or free air

Musculoskeletal: No acute or significant osseous findings.
IMPRESSION: 1. No CT evidence for acute intra-abdominal or pelvic abnormality.
2. Small distal esophageal hiatal hernia

## 2022-11-17 ENCOUNTER — Other Ambulatory Visit: Payer: Self-pay | Admitting: Critical Care Medicine

## 2022-11-17 DIAGNOSIS — I10 Essential (primary) hypertension: Secondary | ICD-10-CM

## 2022-11-17 MED ORDER — LOSARTAN POTASSIUM 100 MG PO TABS: 100.0000 mg | ORAL_TABLET | Freq: Every day | ORAL | 0 refills | Status: DC

## 2022-11-17 NOTE — Telephone Encounter (Signed)
Medication Refill - Medication: losartan (COZAAR) 100 MG tablet   Has the patient contacted their pharmacy? No because of language barrier (Agent: If no, request that the patient contact the pharmacy for the refill. If patient does not wish to contact the pharmacy document the reason why and proceed with request.) (Agent: If yes, when and what did the pharmacy advise?)pt called directly in  Preferred Pharmacy (with phone number or street name):  [facility] [address] [place] - [address]  [address] [place] [address]  Phone: [phone] Fax: [phone]  Hours: Not open 24 hours   Has the patient been seen for an appointment in the last year OR does the patient have an upcoming appointment? yes  Agent: Please be advised that RX refills may take up to 3 business days. We ask that you follow-up with your pharmacy.

## 2022-11-17 NOTE — Telephone Encounter (Signed)
Requested Prescriptions  Pending Prescriptions Disp Refills   losartan (COZAAR) 100 MG tablet 90 tablet 0    Sig: Take 1 tablet (100 mg total) by mouth daily.     Cardiovascular:  Angiotensin Receptor Blockers Failed - 11/17/2022  5:05 PM      Failed - Cr in normal range and within 180 days    Creatinine, Ser  Date Value Ref Range Status  01/21/2022 0.58 0.44 - 1.00 mg/dL Final         Failed - K in normal range and within 180 days    Potassium  Date Value Ref Range Status  01/21/2022 3.5 3.5 - 5.1 mmol/L Final         Passed - Patient is not pregnant      Passed - Last BP in normal range    BP Readings from Last 1 Encounters:  09/21/22 136/78         Passed - Valid encounter within last 6 months    Recent Outpatient Visits           3 months ago SND (sensory-neural deafness), asymmetrical   [facility] Storm Frisk, MD   9 months ago Elevated liver enzymes   [facility] Storm Frisk, MD   1 year ago Elevated LFTs   [facility] [place], [place]   1 year ago Elevated LDL cholesterol level   [facility] Storm Frisk, MD   1 year ago Bilateral hearing loss due to cerumen impaction   [facility] Storm Frisk, MD       Future Appointments             In 2 months Storm Frisk, MD [facility]

## 2022-11-23 ENCOUNTER — Ambulatory Visit: Payer: Self-pay | Admitting: *Deleted

## 2022-11-23 NOTE — Telephone Encounter (Signed)
Interpreter Donald Pore ID # [id number] Chief Complaint: right foot pain and discoloration "black" cool to touch. Symptoms: right foot ankle pain can walk. Black in color top of foot to ankle. Started hurting 15 days ago . Bilateral hand pain and injection given per Ortho on 09/21/22 was not effective and pain is worse.  Frequency: 15 days ago  Pertinent Negatives: Patient denies fever.  Disposition: [x] ED /[] Urgent Care (no appt availability in office) / [] Appointment(In office/virtual)/ []  [place]/ [] Home Care/ [] Refused Recommended Disposition /[] [place]/ []  Follow-up with PCP Additional Notes:   Instructed patient to go to ED. Gave patient # to Ortho care to address worsening pain issues after injection given.    Reason for Disposition  Entire foot is cool or blue in comparison to other foot  Answer Assessment - Initial Assessment Questions 1. ONSET: "When did the pain start?"      15 days ago 2. LOCATION: "Where is the pain located?"      Right foot  3. PAIN: "How bad is the pain?"    (Scale 1-10; or mild, moderate, severe)  - MILD (1-3): doesn't interfere with normal activities.   - MODERATE (4-7): interferes with normal activities (e.g., work or school) or awakens from sleep, limping.   - SEVERE (8-10): excruciating pain, unable to do any normal activities, unable to walk.      Can walk but has  a lot of pain  difficulty sleeping  4. WORK OR EXERCISE: "Has there been any recent work or exercise that involved this part of the body?"      Na  5. CAUSE: "What do you think is causing the foot pain?"     Not sure  6. OTHER SYMPTOMS: "Do you have any other symptoms?" (e.g., leg pain, rash, fever, numbness)     Right foot pain , blackened area reported . 7. PREGNANCY: "Is there any chance you are pregnant?" "When was your last menstrual period?"     na  Protocols used: Foot Pain-A-AH

## 2022-11-23 NOTE — Telephone Encounter (Signed)
Noted  

## 2022-11-25 ENCOUNTER — Ambulatory Visit: Payer: No Typology Code available for payment source | Admitting: Podiatry

## 2022-11-26 ENCOUNTER — Other Ambulatory Visit: Payer: Self-pay

## 2022-11-26 ENCOUNTER — Ambulatory Visit (INDEPENDENT_AMBULATORY_CARE_PROVIDER_SITE_OTHER): Payer: No Typology Code available for payment source | Admitting: Podiatry

## 2022-11-26 ENCOUNTER — Ambulatory Visit (INDEPENDENT_AMBULATORY_CARE_PROVIDER_SITE_OTHER): Payer: Self-pay

## 2022-11-26 DIAGNOSIS — M2021 Hallux rigidus, right foot: Secondary | ICD-10-CM

## 2022-11-26 DIAGNOSIS — M7661 Achilles tendinitis, right leg: Secondary | ICD-10-CM

## 2022-11-26 DIAGNOSIS — M9261 Juvenile osteochondrosis of tarsus, right ankle: Secondary | ICD-10-CM

## 2022-11-26 DIAGNOSIS — M25471 Effusion, right ankle: Secondary | ICD-10-CM

## 2022-11-26 MED ORDER — MELOXICAM 7.5 MG PO TABS
7.5000 mg | ORAL_TABLET | Freq: Every day | ORAL | 0 refills | Status: DC
  Filled 2022-11-26: qty 30, 30d supply, fill #0

## 2022-11-26 MED ORDER — METHYLPREDNISOLONE 4 MG PO TBPK
ORAL_TABLET | ORAL | 0 refills | Status: AC
  Filled 2022-11-26: qty 21, 6d supply, fill #0

## 2022-11-26 NOTE — Progress Notes (Signed)
Subjective:  Patient ID: Claudia Hernandez, female    DOB: 1951/05/17,  MRN: [id number]  Chief Complaint  Patient presents with   Ankle Pain    Right ankle pain on going for 20 days constant pain when pressure is applied. Patient states she's also here for a surgery consult on her right foot great toe and second toe digits     72 y.o. female presents with concern for pain in the back of her right heel.  She says the pain has been going on for 20 is constant pain with pressure on the area.  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  There was plans for surgery to be done however she decided she would want to get it done due to fear of the surgery.  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 Nails are of normal length thickness and coloration.   Vascular:  Dorsalis Pedis artery and Posterior Tibial artery pedal pulses are 2/4 bilateral.  Capillary fill time < 3 sec to all digits.   Neruologic: Grossly intact via light touch bilateral. Protective threshold intact to all sites bilateral.   Musculoskeletal: Severe pain with palpation of the posterior aspect of the heel.  Osseous prominence noted at this location.  There is also osseous prominence noted about the first MPJ on the  right foot.  Decreased range of motion in the ankle with dorsiflexion and pain increased with dorsiflexion.  Limited to no range of motion of the first MPJ on the right foot  Gait: Unassisted, Nonantalgic.   No images are attached to the encounter.  Radiographs:  Date: 11/26/2022 XR the right foot Weightbearing AP/Lateral/Oblique   Findings: Haglund's deformity is noted with prominence of the posterior superior aspect of the calcaneus.  Severe arthritic changes with decreased joint space noted about the first MPJ with hypertrophic bone formation medial and lateral. Assessment:   1. Haglund's deformity of right heel   2. Achilles tendinitis of right lower extremity   3. Hallux rigidus of right foot   4. Right ankle swelling      Plan:  Patient was evaluated and treated and all questions answered.  # Insertional Achilles tendinitis this related to Haglund's deformity right heel -Discussed with patient she does have Achilles tendinitis on the right heel -I recommend we proceed with stretching exercises as well as anti-inflammatory modalities -Recommend use of heel lifts for her shoes and provided these to the patient -eRx for methylprednisolone 4 mg steroid taper pack take as directed for 6 days -eRx for meloxicam 7.5 mg take once daily for the next 30 days -Recommend icing for pain relief as  needed  # Hallux rigidus right -Severe arthritic changes noted in the first MPJ -Discussed we will readdress this problem after her next appointment if she is feeling better in regards to the right heel -Plan would be for arthrodesis of the first MPJ should she want to proceed with surgical correction  Return in about 4 weeks (around 12/24/2022) for Achilles tendinitis right, hallux rigidus right.          Corinna Gab, DPM [facility] / [facility]

## 2022-11-26 NOTE — Patient Instructions (Signed)
Achilles Tendinitis  with Rehab Achilles tendinitis is a disorder of the Achilles tendon. The Achilles tendon connects the large calf muscles (Gastrocnemius and Soleus) to the heel bone (calcaneus). This tendon is sometimes called the heel cord. It is important for pushing-off and standing on your toes and is important for walking, running, or jumping. Tendinitis is often caused by overuse and repetitive microtrauma. SYMPTOMS  Pain, tenderness, swelling, warmth, and redness may occur over the Achilles tendon even at rest.  Pain with pushing off, or flexing or extending the ankle.  Pain that is worsened after or during activity. CAUSES   Overuse sometimes seen with rapid increase in exercise programs or in sports requiring running and jumping.  Poor physical conditioning (strength and flexibility or endurance).  Running sports, especially training running down hills.  Inadequate warm-up before practice or play or failure to stretch before participation.  Injury to the tendon. PREVENTION   Warm up and stretch before practice or competition.  Allow time for adequate rest and recovery between practices and competition.  Keep up conditioning.  Keep up ankle and leg flexibility.  Improve or keep muscle strength and endurance.  Improve cardiovascular fitness.  Use proper technique.  Use proper equipment (shoes, skates).  To help prevent recurrence, taping, protective strapping, or an adhesive bandage may be recommended for several weeks after healing is complete. PROGNOSIS   Recovery may take weeks to several months to heal.  Longer recovery is expected if symptoms have been prolonged.  Recovery is usually quicker if the inflammation is due to a direct blow as compared with overuse or sudden strain. RELATED COMPLICATIONS   Healing time will be prolonged if the condition is not correctly treated. The injury must be given plenty of time to heal.  Symptoms can reoccur if  activity is resumed too soon.  Untreated, tendinitis may increase the risk of tendon rupture requiring additional time for recovery and possibly surgery. TREATMENT   The first treatment consists of rest anti-inflammatory medication, and ice to relieve the pain.  Stretching and strengthening exercises after resolution of pain will likely help reduce the risk of recurrence. Referral to a physical therapist or athletic trainer for further evaluation and treatment may be helpful.  A walking boot or cast may be recommended to rest the Achilles tendon. This can help break the cycle of inflammation and microtrauma.  Arch supports (orthotics) may be prescribed or recommended by your caregiver as an adjunct to therapy and rest.  Surgery to remove the inflamed tendon lining or degenerated tendon tissue is rarely necessary and has shown less than predictable results. MEDICATION   Nonsteroidal anti-inflammatory medications, such as aspirin and ibuprofen, may be used for pain and inflammation relief. Do not take within 7 days before surgery. Take these as directed by your caregiver. Contact your caregiver immediately if any bleeding, stomach upset, or signs of allergic reaction occur. Other minor pain relievers, such as acetaminophen, may also be used.  Pain relievers may be prescribed as necessary by your caregiver. Do not take prescription pain medication for longer than 4 to 7 days. Use only as directed and only as much as you need. HEAT AND COLD  Cold is used to relieve pain and reduce inflammation for acute and chronic Achilles tendinitis. Cold should be applied for 10 to 15 minutes every 2 to 3 hours for inflammation and pain and immediately after any activity that aggravates your symptoms. Use ice packs or an ice massage.  Heat may be used   before performing stretching and strengthening activities prescribed by your caregiver. Use a heat pack or a warm soak. SEEK MEDICAL CARE IF:  Symptoms get  worse or do not improve in 2 weeks despite treatment.  New, unexplained symptoms develop. Drugs used in treatment may produce side effects.   EXERCISES-- hold each stretch for 30 seconds and repeat 10 times.  Complete each stretch 3 times per day.   RANGE OF MOTION (ROM) AND STRETCHING EXERCISES - Achilles Tendinitis  These exercises may help you when beginning to rehabilitate your injury. Your symptoms may resolve with or without further involvement from your physician, physical therapist or athletic trainer. While completing these exercises, remember:   Restoring tissue flexibility helps normal motion to return to the joints. This allows healthier, less painful movement and activity.  An effective stretch should be held for at least 30 seconds.  A stretch should never be painful. You should only feel a gentle lengthening or release in the stretched tissue. STRETCH  Gastroc, Standing   Place hands on wall.  Extend right / left leg, keeping the front knee somewhat bent.  Slightly point your toes inward on your back foot.  Keeping your right / left heel on the floor and your knee straight, shift your weight toward the wall, not allowing your back to arch.  You should feel a gentle stretch in the right / left calf. Hold this position for __________ seconds. Repeat __________ times. Complete this stretch __________ times per day. STRETCH  Soleus, Standing   Place hands on wall.  Extend right / left leg, keeping the other knee somewhat bent.  Slightly point your toes inward on your back foot.  Keep your right / left heel on the floor, bend your back knee, and slightly shift your weight over the back leg so that you feel a gentle stretch deep in your back calf.  Hold this position for __________ seconds. Repeat __________ times. Complete this stretch __________ times per day. STRETCH  Gastrocsoleus, Standing  Note: This exercise can place a lot of stress on your foot and ankle.  Please complete this exercise only if specifically instructed by your caregiver.   Place the ball of your right / left foot on a step, keeping your other foot firmly on the same step.  Hold on to the wall or a rail for balance.  Slowly lift your other foot, allowing your body weight to press your heel down over the edge of the step.  You should feel a stretch in your right / left calf.  Hold this position for __________ seconds.  Repeat this exercise with a slight bend in your knee. Repeat __________ times. Complete this stretch __________ times per day.  STRENGTHENING EXERCISES - Achilles Tendinitis These exercises may help you when beginning to rehabilitate your injury. They may resolve your symptoms with or without further involvement from your physician, physical therapist or athletic trainer. While completing these exercises, remember:   Muscles can gain both the endurance and the strength needed for everyday activities through controlled exercises.  Complete these exercises as instructed by your physician, physical therapist or athletic trainer. Progress the resistance and repetitions only as guided.  You may experience muscle soreness or fatigue, but the pain or discomfort you are trying to eliminate should never worsen during these exercises. If this pain does worsen, stop and make certain you are following the directions exactly. If the pain is still present after adjustments, discontinue the exercise until you can discuss   the trouble with your clinician. STRENGTH - Plantar-flexors   Sit with your right / left leg extended. Holding onto both ends of a rubber exercise band/tubing, loop it around the ball of your foot. Keep a slight tension in the band.  Slowly push your toes away from you, pointing them downward.  Hold this position for __________ seconds. Return slowly, controlling the tension in the band/tubing. Repeat __________ times. Complete this exercise __________ times  per day.  STRENGTH - Plantar-flexors   Stand with your feet shoulder width apart. Steady yourself with a wall or table using as little support as needed.  Keeping your weight evenly spread over the width of your feet, rise up on your toes.*  Hold this position for __________ seconds. Repeat __________ times. Complete this exercise __________ times per day.  *If this is too easy, shift your weight toward your right / left leg until you feel challenged. Ultimately, you may be asked to do this exercise with your right / left foot only. STRENGTH  Plantar-flexors, Eccentric  Note: This exercise can place a lot of stress on your foot and ankle. Please complete this exercise only if specifically instructed by your caregiver.   Place the balls of your feet on a step. With your hands, use only enough support from a wall or rail to keep your balance.  Keep your knees straight and rise up on your toes.  Slowly shift your weight entirely to your right / left toes and pick up your opposite foot. Gently and with controlled movement, lower your weight through your right / left foot so that your heel drops below the level of the step. You will feel a slight stretch in the back of your calf at the end position.  Use the healthy leg to help rise up onto the balls of both feet, then lower weight only on the right / left leg again. Build up to 15 repetitions. Then progress to 3 consecutive sets of 15 repetitions.*  After completing the above exercise, complete the same exercise with a slight knee bend (about 30 degrees). Again, build up to 15 repetitions. Then progress to 3 consecutive sets of 15 repetitions.* Perform this exercise __________ times per day.  *When you easily complete 3 sets of 15, your physician, physical therapist or athletic trainer may advise you to add resistance by wearing a backpack filled with additional weight. STRENGTH - Plantar Flexors, Seated   Sit on a chair that allows your feet  to rest flat on the ground. If necessary, sit at the edge of the chair.  Keeping your toes firmly on the ground, lift your right / left heel as far as you can without increasing any discomfort in your ankle. Repeat __________ times. Complete this exercise __________ times a day. *If instructed by your physician, physical therapist or athletic trainer, you may add ____________________ of resistance by placing a weighted object on your right / left knee. Document Released: 12/23/2004 Document Revised: 08/16/2011 Document Reviewed: 09/05/2008 [employer] Patient Information 2014 [employer].    

## 2022-12-30 ENCOUNTER — Other Ambulatory Visit: Payer: Self-pay

## 2022-12-30 ENCOUNTER — Ambulatory Visit: Payer: No Typology Code available for payment source | Admitting: Podiatry

## 2022-12-30 DIAGNOSIS — M2021 Hallux rigidus, right foot: Secondary | ICD-10-CM

## 2022-12-30 DIAGNOSIS — M9261 Juvenile osteochondrosis of tarsus, right ankle: Secondary | ICD-10-CM

## 2022-12-30 DIAGNOSIS — M7661 Achilles tendinitis, right leg: Secondary | ICD-10-CM

## 2022-12-30 MED ORDER — MELOXICAM 7.5 MG PO TABS
7.5000 mg | ORAL_TABLET | Freq: Every day | ORAL | 3 refills | Status: DC
  Filled 2022-12-30: qty 30, 30d supply, fill #0

## 2022-12-30 NOTE — Progress Notes (Signed)
Subjective:  Patient ID: Claudia Hernandez, female    DOB: Oct 14, 1950,  MRN: [id number]  Chief Complaint  Patient presents with   Follow-up    4 week follow up achilles tendinitis right, hallux rigidus right. Pain only when she applies pressure with her hand. Pain is a lot better when she is walking. She does have a lot of swelling especially when she is walking.        72 y.o. female presents for follow-up of Achilles tendinitis of the right foot as well as Haglund's deformity.  Also with hallux rigidus on the right.  Patient says her pain is a lot better when she is walking.  She says the medications are helping and she was given a steroid Dosepak as well as meloxicam.  She is not interested in surgical intervention at this time.   Past Medical History:  Diagnosis Date   Arthritis    Asthma    hx of   Chronic diffuse otitis externa of both ears 08/04/2021   Heart murmur    unsure of this, but was told she had one by MD in [place]   Hypertension     Allergies  Allergen Reactions   Statins Other (See Comments)    ELEVATED LFTs    ROS: Negative except as per HPI above  Objective:  General: AAO x3, NAD  Dermatological: With inspection and palpation of the right and left lower extremities there are no open sores, no preulcerative lesions, no rash or signs of infection present. Nails are of normal length thickness and coloration.   Vascular:  Dorsalis Pedis artery and Posterior Tibial artery pedal pulses are 2/4 bilateral.  Capillary fill time < 3 sec to all digits.   Neruologic: Grossly intact via light touch bilateral. Protective threshold intact to all sites bilateral.   Musculoskeletal: Much decreased pain with palpation of the posterior aspect of the heel.  Osseous prominence noted at this location.  There is also osseous prominence noted about the first MPJ on the right foot.  Decreased range of motion in the ankle with dorsiflexion and pain increased with  dorsiflexion.  Limited to no range of motion of the first MPJ on the right foot  Gait: Unassisted, Nonantalgic.   No images are attached to the encounter.  Radiographs:  Date: 11/26/2022 XR the right foot Weightbearing AP/Lateral/Oblique   Findings: Haglund's deformity is noted with prominence of the posterior superior aspect of the calcaneus.  Severe arthritic changes with decreased joint space noted about the first MPJ with hypertrophic bone formation medial and lateral. Assessment:   1. Haglund's deformity of right heel   2. Achilles tendinitis of right lower extremity   3. Hallux rigidus of right foot       Plan:  Patient was evaluated and treated and all questions answered.  # Insertional Achilles tendinitis this related to Haglund's deformity right heel -Discussed with the patient that she is overall much improved from prior  -Believe that the steroid pack was effective in reducing inflammation and pain. -Patient requesting additional meloxicam which I will send. -eRx for meloxicam 7.5 mg take once daily for the next 30 days -Recommend icing for pain relief as needed  # Hallux rigidus right -Severe arthritic changes noted in the first MPJ -Discussed conservative and surgical treatment options with the patient in detail -Discussed conservatively could consider steroid injection for temporary pain relief and edema control however she does not want any steroid shots at this time she says they  have not helped in the past -Discussed surgical intervention would include first MPJ arthrodesis which would decrease the medial osseous prominence of the first metatarsal head mild bunion deformity as well as decreased arthritic pain joint pain at the first MPJ. -Discussed risk benefits alternatives and possible complications associate with this procedure as well as the expected postoperative recovery course.  Patient wants additional time to think about the surgery before agreeing to  proceed  Return if symptoms worsen or fail to improve.          Corinna Gab, DPM [facility] / [facility]

## 2023-01-03 ENCOUNTER — Other Ambulatory Visit: Payer: Self-pay

## 2023-01-19 ENCOUNTER — Ambulatory Visit: Payer: Self-pay | Admitting: Critical Care Medicine

## 2023-01-19 NOTE — Progress Notes (Deleted)
Established Patient Office Visit  Subjective:  Patient ID: Claudia Hernandez, female    DOB: 1951-02-29  Age: 72 y.o. MRN: [id number]  No chief complaint on file.     HPI Martica Otts NWGNFA Robb Matar presents for primary care follow-up and   07/2021 still having difficulty with fullness in the ears and difficulty hearing.  We try to lavage ears out in December unsuccessfully and gave her a course of Debrox but she did not ever fill this prescription.  She comes in today with similar complaints as in December.  She is also due a Prevnar 20 vaccine and she did agree to receive this.  This visit was assisted by Spanish and video interpreter [name] [phone]  Patient's been compliant with her blood pressure medicine and cholesterol medicine on arrival blood pressure is 121/79.  She is also here to see our financial counselor to get the orange card Patient is still having difficulty with left knee pain  9/14 Patient seen in return follow-up visit was assisted by Spanish interpreter video interpreter Connye Burkitt [phone].  Patient has multiple complaints at this visit.  She continues to not hear well despite having her ears cleaned out with wax 2 weeks ago by nursing.  She also complains of bilateral hand pain and swelling and pain in the ankle and right toes of the right foot.  She yet to go to ENT.  She does have the orange card now.  ENT does not take the orange card.  She has been seen by podiatry.  Also has elevated liver function test that evolved in May of this year after normal exam prior values.  We had started her on atorvastatin this has been held next had a liver functions in August were coming down to normal.  She has a gastroenterology referral but has yet to achieve.  1 barrier is she does not answer her phone when called another barrier is sometimes does not understand the individual on the phone because of no ability to understand [place].  Patient did agree and receive the flu  vaccine.  On arrival blood pressure is good 115/75 Patient was in the urgent care a month ago for diarrhea this is now totally resolved  07/21/22 This patient seen in return follow-up she continues to have decreased hearing left greater than right ear.  She was never able to achieve an otolaryngology appointment.  She still uninsured.  Another issue she hurt her left wrist picking up a heavy baby 3 weeks ago and now has left wrist pain in the plantar aspect of the wrist.  Note this visit was assisted by Spanish interpreter Sydell Axon [phone]  Patient does need colon cancer screening.  There are no other complaints.  01/20/23  Past Medical History:  Diagnosis Date   Arthritis    Asthma    hx of   Chronic diffuse otitis externa of both ears 08/04/2021   Heart murmur    unsure of this, but was told she had one by MD in [place]   Hypertension     Past Surgical History:  Procedure Laterality Date   BLADDER SURGERY     CESAREAN SECTION     CHOLECYSTECTOMY     gallbladder removed     left foot surgery      Family History  Problem Relation Age of Onset   Drug abuse Neg Hx    Hypertension Neg Hx    Colon cancer Neg Hx    Esophageal cancer Neg Hx  Rectal cancer Neg Hx    Stomach cancer Neg Hx    Breast cancer Neg Hx     Social History   Socioeconomic History   Marital status: Single    Spouse name: Not on file   Number of children: 5   Years of education: Not on file   Highest education level: Bachelor's degree (e.g., BA, AB, BS)  Occupational History   Not on file  Tobacco Use   Smoking status: Never   Smokeless tobacco: Never  Vaping Use   Vaping status: Never Used  Substance and Sexual Activity   Alcohol use: Not Currently   Drug use: Never   Sexual activity: Not on file  Other Topics Concern   Not on file  Social History Narrative   Not on file   Social Determinants of Health   Financial Resource Strain: Not on file  Food Insecurity: Food Insecurity Present  (02/26/2021)   Hunger Vital Sign    Worried About Running Out of Food in the Last Year: Sometimes true    Ran Out of Food in the Last Year: Sometimes true  Transportation Needs: Unmet Transportation Needs (02/26/2021)   PRAPARE - Administrator, Civil Service (Medical): Yes    Lack of Transportation (Non-Medical): Yes  Physical Activity: Not on file  Stress: Not on file  Social Connections: Not on file  Intimate Partner Violence: Not on file    Outpatient Medications Prior to Visit  Medication Sig Dispense Refill   ACETAMINOPHEN PO Take by mouth. PRN     COLLAGEN PO Take by mouth.     ezetimibe (ZETIA) 10 MG tablet Take 1 tablet (10 mg total) by mouth daily. 90 tablet 3   HYDROcodone-acetaminophen (NORCO) 5-325 MG tablet Take 1 tablet by mouth every 6 (six) hours as needed. 10 tablet 0   hydrocortisone cream 0.5 % Apply 1 Application topically 2 (two) times daily. 30 g 0   losartan (COZAAR) 100 MG tablet Take 1 tablet (100 mg total) by mouth daily. 90 tablet 0   meloxicam (MOBIC) 7.5 MG tablet Take 1 tablet (7.5 mg total) by mouth daily. 30 tablet 0   meloxicam (MOBIC) 7.5 MG tablet Take 1 tablet (7.5 mg total) by mouth daily. 30 tablet 0   meloxicam (MOBIC) 7.5 MG tablet Take 1 tablet (7.5 mg total) by mouth daily. 30 tablet 3   nitrofurantoin, macrocrystal-monohydrate, (MACROBID) 100 MG capsule Take 1 capsule (100 mg total) by mouth 2 (two) times daily. 14 capsule 0   olopatadine (PATADAY) 0.1 % ophthalmic solution Place 1 drop into both eyes 2 (two) times daily. 5 mL 0   No facility-administered medications prior to visit.    Allergies  Allergen Reactions   Statins Other (See Comments)    ELEVATED LFTs    ROS Review of Systems  Constitutional: Negative.   HENT:  Positive for hearing loss. Negative for ear pain, postnasal drip, rhinorrhea, sinus pressure, sore throat, trouble swallowing and voice change.   Eyes: Negative.   Respiratory: Negative.  Negative for  apnea, cough, choking, chest tightness, shortness of breath, wheezing and stridor.   Cardiovascular: Negative.  Negative for chest pain, palpitations and leg swelling.  Gastrointestinal: Negative.  Negative for abdominal distention, abdominal pain, nausea and vomiting.  Genitourinary: Negative.   Musculoskeletal:  Positive for joint swelling. Negative for arthralgias and myalgias.       Left wrist pain  Skin: Negative.  Negative for rash.  Allergic/Immunologic: Negative.  Negative for  environmental allergies and food allergies.  Neurological: Negative.  Negative for dizziness, syncope, weakness and headaches.  Hematological: Negative.  Negative for adenopathy. Does not bruise/bleed easily.  Psychiatric/Behavioral: Negative.  Negative for agitation and sleep disturbance. The patient is not nervous/anxious.       Objective:    Physical Exam Vitals reviewed.  Constitutional:      Appearance: Normal appearance. She is well-developed. She is not diaphoretic.  HENT:     Head: Normocephalic and atraumatic.     Right Ear: External ear normal. There is no impacted cerumen.     Left Ear: External ear normal. There is impacted cerumen.     Ears:     Comments: Left ear canal completely occluded again with earwax    Nose: Nose normal. No nasal deformity, septal deviation, mucosal edema or rhinorrhea.     Right Sinus: No maxillary sinus tenderness or frontal sinus tenderness.     Left Sinus: No maxillary sinus tenderness or frontal sinus tenderness.     Mouth/Throat:     Mouth: Mucous membranes are moist.     Pharynx: Oropharynx is clear. No oropharyngeal exudate.  Eyes:     General: No scleral icterus.    Conjunctiva/sclera: Conjunctivae normal.     Pupils: Pupils are equal, round, and reactive to light.  Neck:     Thyroid: No thyromegaly.     Vascular: No carotid bruit or JVD.     Trachea: Trachea normal. No tracheal tenderness or tracheal deviation.  Cardiovascular:     Rate and Rhythm:  Normal rate and regular rhythm.     Chest Wall: PMI is not displaced.     Pulses: Normal pulses. No decreased pulses.     Heart sounds: Normal heart sounds, S1 normal and S2 normal. Heart sounds not distant. No murmur heard.    No systolic murmur is present.     No diastolic murmur is present.     No friction rub. No gallop. No S3 or S4 sounds.  Pulmonary:     Effort: Pulmonary effort is normal. No tachypnea, accessory muscle usage or respiratory distress.     Breath sounds: Normal breath sounds. No stridor. No decreased breath sounds, wheezing, rhonchi or rales.  Chest:     Chest wall: No tenderness.  Abdominal:     General: Bowel sounds are normal. There is no distension.     Palpations: Abdomen is soft. Abdomen is not rigid.     Tenderness: There is no abdominal tenderness. There is no guarding or rebound.  Musculoskeletal:        General: Tenderness present. Normal range of motion.     Cervical back: Normal range of motion and neck supple. No edema, erythema or rigidity. No muscular tenderness. Normal range of motion.     Comments: Bilateral ankle tenderness and right great and second toe tenderness  Heberden's nodes in both distal fingers of left and right hands with discomfort  Lymphadenopathy:     Head:     Right side of head: No submental or submandibular adenopathy.     Left side of head: No submental or submandibular adenopathy.     Cervical: No cervical adenopathy.  Skin:    General: Skin is warm and dry.     Coloration: Skin is not pale.     Findings: No rash.     Nails: There is no clubbing.  Neurological:     Mental Status: She is alert and oriented to person, place, and time.  Sensory: No sensory deficit.  Psychiatric:        Mood and Affect: Mood normal.        Speech: Speech normal.        Behavior: Behavior normal.        Thought Content: Thought content normal.     There were no vitals taken for this visit. Wt Readings from Last 3 Encounters:   07/21/22 128 lb (58.1 kg)  02/18/22 129 lb 12.8 oz (58.9 kg)  11/05/21 135 lb 6.4 oz (61.4 kg)     Health Maintenance Due  Topic Date Due   COVID-19 Vaccine (4 - 2023-24 season) 02/05/2022   INFLUENZA VACCINE  01/06/2023    There are no preventive care reminders to display for this patient.  Lab Results  Component Value Date   TSH 1.870 07/20/2019   Lab Results  Component Value Date   WBC 5.9 01/21/2022   HGB 14.0 01/21/2022   HCT 40.6 01/21/2022   MCV 104.9 (H) 01/21/2022   PLT 246 01/21/2022   Lab Results  Component Value Date   NA 137 01/21/2022   K 3.5 01/21/2022   CO2 24 01/21/2022   GLUCOSE 88 01/21/2022   BUN 10 01/21/2022   CREATININE 0.58 01/21/2022   BILITOT 0.4 02/18/2022   ALKPHOS 85 02/18/2022   AST 24 02/18/2022   ALT 17 02/18/2022   PROT 7.2 02/18/2022   ALBUMIN 4.5 02/18/2022   CALCIUM 8.8 (L) 01/21/2022   ANIONGAP 7 01/21/2022   EGFR 97 03/24/2021   Lab Results  Component Value Date   CHOL 172 08/04/2021   Lab Results  Component Value Date   HDL 48 08/04/2021   Lab Results  Component Value Date   LDLCALC 100 (H) 08/04/2021   Lab Results  Component Value Date   TRIG 136 08/04/2021   Lab Results  Component Value Date   CHOLHDL 3.6 08/04/2021   Lab Results  Component Value Date   HGBA1C 5.2 08/04/2021      Assessment & Plan:   Problem List Items Addressed This Visit   None   No orders of the defined types were placed in this encounter. 30 minutes spent extra time needed because of language barrier Mammogram will be ordered through scholarship Follow-up: No follow-ups on file.    Shan Levans, MD

## 2023-01-20 ENCOUNTER — Ambulatory Visit: Payer: Self-pay | Admitting: Critical Care Medicine

## 2023-02-13 ENCOUNTER — Other Ambulatory Visit: Payer: Self-pay | Admitting: Critical Care Medicine

## 2023-02-13 DIAGNOSIS — I10 Essential (primary) hypertension: Secondary | ICD-10-CM

## 2023-03-01 ENCOUNTER — Other Ambulatory Visit: Payer: Self-pay

## 2023-03-07 ENCOUNTER — Ambulatory Visit: Payer: Self-pay | Admitting: Nurse Practitioner

## 2023-04-11 ENCOUNTER — Encounter: Payer: Self-pay | Admitting: Internal Medicine

## 2023-04-11 ENCOUNTER — Other Ambulatory Visit: Payer: Self-pay

## 2023-04-11 ENCOUNTER — Ambulatory Visit: Payer: Self-pay | Attending: Critical Care Medicine | Admitting: Internal Medicine

## 2023-04-11 VITALS — BP 126/77 | HR 74 | Wt 127.6 lb

## 2023-04-11 DIAGNOSIS — D7589 Other specified diseases of blood and blood-forming organs: Secondary | ICD-10-CM

## 2023-04-11 DIAGNOSIS — H6123 Impacted cerumen, bilateral: Secondary | ICD-10-CM

## 2023-04-11 DIAGNOSIS — M7661 Achilles tendinitis, right leg: Secondary | ICD-10-CM

## 2023-04-11 DIAGNOSIS — I10 Essential (primary) hypertension: Secondary | ICD-10-CM

## 2023-04-11 DIAGNOSIS — E785 Hyperlipidemia, unspecified: Secondary | ICD-10-CM

## 2023-04-11 DIAGNOSIS — M25532 Pain in left wrist: Secondary | ICD-10-CM

## 2023-04-11 MED ORDER — MELOXICAM 7.5 MG PO TABS
7.5000 mg | ORAL_TABLET | Freq: Every day | ORAL | 3 refills | Status: DC
  Filled 2023-04-11: qty 30, 30d supply, fill #0
  Filled 2023-05-26: qty 30, 30d supply, fill #1
  Filled 2023-07-08: qty 30, 30d supply, fill #2
  Filled 2023-08-04: qty 30, 30d supply, fill #3

## 2023-04-11 MED ORDER — PREDNISONE 10 MG PO TABS
10.0000 mg | ORAL_TABLET | Freq: Every day | ORAL | 0 refills | Status: DC
  Filled 2023-04-11: qty 5, 5d supply, fill #0

## 2023-04-11 MED ORDER — DEBROX 6.5 % OT SOLN
5.0000 [drp] | Freq: Every day | OTIC | 0 refills | Status: DC
  Filled 2023-04-11: qty 15, 25d supply, fill #0
  Filled 2023-04-11: qty 15, 60d supply, fill #0

## 2023-04-11 NOTE — Progress Notes (Signed)
Patient ID: Claudia Hernandez, female    DOB: 10/20/1950  MRN: [id number]  CC: Medical Management of Chronic Issues (Patient states she is also having pain in wrist and ankle(heel)) and Medication Refill   Subjective: Claudia Hernandez is a 72 y.o. female who presents for chronic ds management. Her concerns today include:  Pt with hx of sensory-neural deafness asymmetric, HTN,  AMN Language interpreter used during this encounter. #[id number]  HTN:  on Cozaar 100 mg daily and taking consistently Checks BP 2x/mth.  Gives range 100-125 SBP. No HA/dizziness/CP/SOB/  Some swelling in RT foot Reports taking cholesterol med consistently.  On Zetia.    Complains of decreased hearing.  Would like to have her ears checked.  Complains of pain in the right ankle.  She saw podiatry for this during the summer.  She was diagnosed with arthritis and Achilles tendinitis.  Placed on meloxicam and Medrol pack.  Ran out of meloxicam about 3 weeks ago.  Pain has increased again.  She points to the area over the Achilles tendon and lateral malleolus.  Also reports pain in the left wrist.  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  Had seen orthopedics Dr. Roda Shutters in April of this year for the same.  He had given a steroid injection which she states was very painful.  She endorses eating and sleeping well.  She has not had any falls.  She lives with her son.  HM:  yes to flu shot,  advised to get from our pharmacy down stairs   Patient Active Problem List   Diagnosis Date Noted   Wrist pain, acute, left 07/22/2022   Statin intolerance 02/19/2022   Arthritis of finger of left hand 02/18/2022   Elevated liver enzymes 02/18/2022   SND (sensory-neural deafness), asymmetrical 02/18/2022   Bilateral knee pain 03/24/2021   Elevated LDL cholesterol level 06/17/2020   Bilateral hearing loss due to cerumen impaction 06/16/2020   Toe pain, right 05/13/2020   Essential hypertension 05/13/2020      Current Outpatient Medications on File Prior to Visit  Medication Sig Dispense Refill   ACETAMINOPHEN PO Take by mouth. PRN     COLLAGEN PO Take by mouth.     ezetimibe (ZETIA) 10 MG tablet Take 1 tablet (10 mg total) by mouth daily. 90 tablet 3   hydrocortisone cream 0.5 % Apply 1 Application topically 2 (two) times daily. 30 g 0   losartan (COZAAR) 100 MG tablet Take 1 tablet by mouth once daily 90 tablet 0   olopatadine (PATADAY) 0.1 % ophthalmic solution Place 1 drop into both eyes 2 (two) times daily. 5 mL 0   No current facility-administered medications on file prior to visit.    Allergies  Allergen Reactions   Statins Other (See Comments)    ELEVATED LFTs    Social History   Socioeconomic History   Marital status: Single    Spouse name: Not on file   Number of children: 5   Years of education: Not on file   Highest education level: Bachelor's degree (e.g., BA, AB, BS)  Occupational History   Not on file  Tobacco Use   Smoking status: Never   Smokeless tobacco: Never  Vaping Use   Vaping status: Never Used  Substance and Sexual Activity   Alcohol use: Not Currently   Drug use: Never   Sexual activity: Not on file  Other Topics Concern   Not on file  Social History Narrative  Not on file   Social Determinants of Health   Financial Resource Strain: Not on file  Food Insecurity: Food Insecurity Present (02/26/2021)   Hunger Vital Sign    Worried About Running Out of Food in the Last Year: Sometimes true    Ran Out of Food in the Last Year: Sometimes true  Transportation Needs: Unmet Transportation Needs (02/26/2021)   PRAPARE - Administrator, Civil Service (Medical): Yes    Lack of Transportation (Non-Medical): Yes  Physical Activity: Not on file  Stress: Not on file  Social Connections: Not on file  Intimate Partner Violence: Not on file    Family History  Problem Relation Age of Onset   Drug abuse Neg Hx    Hypertension Neg Hx     Colon cancer Neg Hx    Esophageal cancer Neg Hx    Rectal cancer Neg Hx    Stomach cancer Neg Hx    Breast cancer Neg Hx     Past Surgical History:  Procedure Laterality Date   BLADDER SURGERY     CESAREAN SECTION     CHOLECYSTECTOMY     gallbladder removed     left foot surgery      ROS: Review of Systems Negative except as stated above  PHYSICAL EXAM: BP 126/77 (BP Location: Right Arm, Patient Position: Sitting, Cuff Size: Normal)   Pulse 74   Wt 127 lb 9.6 oz (57.9 kg)   SpO2 99%   BMI 25.77 kg/m   Physical Exam  General appearance - alert, well appearing, elderly Hispanic female and in no distress.  She ambulates without an assistive device and was able to get on the exam table without assistance. Mental status - normal mood, behavior, speech, dress, motor activity, and thought processes Ears -impacted hard cerumen in both ears obscuring view of the canal and tympanic membrane. Chest - clear to auscultation, no wheezes, rales or rhonchi, symmetric air entry Heart - normal rate, regular rhythm, normal S1, S2, no murmurs, rubs, clicks or gallops Extremities -no edema in the lower legs. MSK: Mild enlargement of the left wrist.  She has mild discomfort with passive range of motion of the wrist. Right foot: She has mild edema over the lateral malleolus.  Discomfort with passive range of motion of the ankle.  Mild to moderate discomfort on palpation over the lower Achilles tendon.      Latest Ref Rng & Units 02/18/2022   10:47 AM 01/21/2022    5:25 PM 10/07/2021    2:46 PM  CMP  Glucose 70 - 99 mg/dL  88  161   BUN 8 - 23 mg/dL  10  14   Creatinine 0.96 - 1.00 mg/dL  0.45  4.09   Sodium 811 - 145 mmol/L  137  139   Potassium 3.5 - 5.1 mmol/L  3.5  4.0   Chloride 98 - 111 mmol/L  106  107   CO2 22 - 32 mmol/L  24  24   Calcium 8.9 - 10.3 mg/dL  8.8  9.6   Total Protein 6.0 - 8.5 g/dL 7.2  7.1  7.4   Total Bilirubin 0.0 - 1.2 mg/dL 0.4  0.5  0.9   Alkaline Phos 44 - 121  IU/L 85  73  101   AST 0 - 40 IU/L 24  56  161   ALT 0 - 32 IU/L 17  56  115    Lipid Panel     Component Value  Date/Time   CHOL 172 08/04/2021 1633   TRIG 136 08/04/2021 1633   HDL 48 08/04/2021 1633   CHOLHDL 3.6 08/04/2021 1633   LDLCALC 100 (H) 08/04/2021 1633    CBC    Component Value Date/Time   WBC 5.9 01/21/2022 1725   RBC 3.87 01/21/2022 1725   HGB 14.0 01/21/2022 1725   HGB 13.5 03/24/2021 0930   HCT 40.6 01/21/2022 1725   HCT 39.3 03/24/2021 0930   PLT 246 01/21/2022 1725   PLT 279 03/24/2021 0930   MCV 104.9 (H) 01/21/2022 1725   MCV 106 (H) 03/24/2021 0930   MCH 36.2 (H) 01/21/2022 1725   MCHC 34.5 01/21/2022 1725   RDW 14.0 01/21/2022 1725   RDW 13.2 03/24/2021 0930   LYMPHSABS 2.0 03/24/2021 0930   MONOABS 0.5 09/23/2020 1714   EOSABS 0.8 (H) 03/24/2021 0930   BASOSABS 0.1 03/24/2021 0930    ASSESSMENT AND PLAN: 1. Essential hypertension At goal.  Continue Cozaar 100 mg daily. - CBC - Comprehensive metabolic panel  2. Achilles tendinitis, right leg Seems as though she had a flare when she ran out of the meloxicam.  Refill sent to the pharmacy.  I will also give a short course of prednisone. - meloxicam (MOBIC) 7.5 MG tablet; Take 1 tablet (7.5 mg total) by mouth daily.  Dispense: 30 tablet; Refill: 3 - predniSONE (DELTASONE) 10 MG tablet; Take 1 tablet (10 mg total) by mouth daily with breakfast.  Dispense: 5 tablet; Refill: 0  3. Wrist pain, left With known arthritis in the wrist. - meloxicam (MOBIC) 7.5 MG tablet; Take 1 tablet (7.5 mg total) by mouth daily.  Dispense: 30 tablet; Refill: 3  4. Bilateral impacted cerumen I have prescribed some Debrox and informed her to use in both ears daily for the next week.  We will have her return to see the nurse in 1 week to have the ears flushed. - carbamide peroxide (DEBROX) 6.5 % OTIC solution; Place 5 drops into both ears daily.  Dispense: 15 mL; Refill: 0  5. Hyperlipidemia, unspecified  hyperlipidemia type Continue Zetia. - Lipid panel  Advised patient to go downstairs to our pharmacy to get the flu vaccine as it would be more cost effective to get it there.  Patient was given the opportunity to ask questions.  Patient verbalized understanding of the plan and was able to repeat key elements of the plan.   This documentation was completed using Paediatric nurse.  Any transcriptional errors are unintentional.  Orders Placed This Encounter  Procedures   CBC   Comprehensive metabolic panel   Lipid panel     Requested Prescriptions   Signed Prescriptions Disp Refills   meloxicam (MOBIC) 7.5 MG tablet 30 tablet 3    Sig: Take 1 tablet (7.5 mg total) by mouth daily.   predniSONE (DELTASONE) 10 MG tablet 5 tablet 0    Sig: Take 1 tablet (10 mg total) by mouth daily with breakfast.   carbamide peroxide (DEBROX) 6.5 % OTIC solution 15 mL 0    Sig: Place 5 drops into both ears daily.    Return in about 4 months (around 08/09/2023) for Give appt with nurse in 1 wk for ears flush.  Jonah Blue, MD, FACP

## 2023-04-11 NOTE — Progress Notes (Signed)
Bonita Quin # [phone]

## 2023-04-12 ENCOUNTER — Telehealth: Payer: Self-pay

## 2023-04-12 LAB — LIPID PANEL
Chol/HDL Ratio: 4 ratio (ref 0.0–4.4)
Cholesterol, Total: 157 mg/dL (ref 100–199)
HDL: 39 mg/dL — ABNORMAL LOW (ref >39)
LDL Chol Calc (NIH): 69 mg/dL (ref 0–99)
Triglycerides: 305 mg/dL — ABNORMAL HIGH (ref 0–149)
VLDL Cholesterol Cal: 49 mg/dL — ABNORMAL HIGH (ref 5–40)

## 2023-04-12 LAB — CBC
Hematocrit: 38.8 % (ref 34.0–46.6)
Hemoglobin: 13.3 g/dL (ref 11.1–15.9)
MCH: 36.7 pg — ABNORMAL HIGH (ref 26.6–33.0)
MCHC: 34.3 g/dL (ref 31.5–35.7)
MCV: 107 fL — ABNORMAL HIGH (ref 79–97)
Platelets: 285 10*3/uL (ref 150–450)
RBC: 3.62 x10E6/uL — ABNORMAL LOW (ref 3.77–5.28)
RDW: 13.5 % (ref 11.7–15.4)
WBC: 8.5 10*3/uL (ref 3.4–10.8)

## 2023-04-12 LAB — COMPREHENSIVE METABOLIC PANEL
ALT: 21 [IU]/L (ref 0–32)
AST: 23 [IU]/L (ref 0–40)
Albumin: 4.4 g/dL (ref 3.8–4.8)
Alkaline Phosphatase: 103 [IU]/L (ref 44–121)
BUN/Creatinine Ratio: 21 (ref 12–28)
BUN: 12 mg/dL (ref 8–27)
Bilirubin Total: <0.2 mg/dL (ref 0.0–1.2)
CO2: 24 mmol/L (ref 20–29)
Calcium: 9.4 mg/dL (ref 8.7–10.3)
Chloride: 104 mmol/L (ref 96–106)
Creatinine, Ser: 0.58 mg/dL (ref 0.57–1.00)
Globulin, Total: 2.6 g/dL (ref 1.5–4.5)
Glucose: 94 mg/dL (ref 70–99)
Potassium: 4.3 mmol/L (ref 3.5–5.2)
Sodium: 143 mmol/L (ref 134–144)
Total Protein: 7 g/dL (ref 6.0–8.5)
eGFR: 97 mL/min/{1.73_m2} (ref >59)

## 2023-04-12 NOTE — Addendum Note (Signed)
Addended by: Jonah Blue B on: 04/12/2023 09:19 AM   Modules accepted: Orders

## 2023-04-12 NOTE — Telephone Encounter (Deleted)
Pt called for results- noted that the results for folate and B12 "needs to be collected" per result note that was supposed to be done with the blood taken yesterday. Pt wanting to know those results.

## 2023-04-12 NOTE — Telephone Encounter (Signed)
Please see result notes.  

## 2023-04-14 ENCOUNTER — Telehealth: Payer: Self-pay

## 2023-04-14 ENCOUNTER — Other Ambulatory Visit: Payer: Self-pay | Admitting: Internal Medicine

## 2023-04-14 DIAGNOSIS — E538 Deficiency of other specified B group vitamins: Secondary | ICD-10-CM | POA: Insufficient documentation

## 2023-04-14 LAB — B12 AND FOLATE PANEL
Folate: 16.6 ng/mL (ref >3.0)
Vitamin B-12: <50 pg/mL — ABNORMAL LOW (ref 232–1245)

## 2023-04-14 LAB — SPECIMEN STATUS REPORT

## 2023-04-14 MED ORDER — CYANOCOBALAMIN 1000 MCG/ML IJ SOLN: 1000.0000 ug | Freq: Once | INTRAMUSCULAR | Status: AC

## 2023-04-14 NOTE — Telephone Encounter (Signed)
Called but no answer. Please advise patient that previous blood drawn will be used for further testing.

## 2023-04-14 NOTE — Telephone Encounter (Signed)
Copied from CRM [id number]. Topic: General - Other >> Apr 14, 2023  3:10 PM Phill Myron wrote:  Pt Claudia Hernandez, questions if she needs to come in a get labs or if you were going to use the blood she had already given. Please advise

## 2023-04-25 ENCOUNTER — Other Ambulatory Visit: Payer: Self-pay

## 2023-04-25 ENCOUNTER — Ambulatory Visit: Payer: Self-pay | Attending: Internal Medicine

## 2023-04-25 DIAGNOSIS — E538 Deficiency of other specified B group vitamins: Secondary | ICD-10-CM

## 2023-04-25 MED ORDER — CYANOCOBALAMIN 1000 MCG/ML IJ SOLN
1000.0000 ug | Freq: Once | INTRAMUSCULAR | Status: AC
  Administered 2023-04-25: 1000 ug via INTRAMUSCULAR

## 2023-04-25 MED ORDER — INFLUENZA VIRUS VACC SPLIT PF (FLUZONE) 0.5 ML IM SUSY
0.5000 mL | PREFILLED_SYRINGE | Freq: Once | INTRAMUSCULAR | 0 refills | Status: AC
  Filled 2023-04-25: qty 0.5, 1d supply, fill #0

## 2023-04-25 NOTE — Progress Notes (Signed)
cyanocobalamin (VITAMIN B12) injection 1,000 mcg administered in left deltoid per protocols. Patient denies and pain or discomfort at injection site. Tolerated injection well no reaction.

## 2023-05-03 ENCOUNTER — Ambulatory Visit: Payer: Self-pay

## 2023-05-09 ENCOUNTER — Ambulatory Visit: Payer: Self-pay

## 2023-05-10 ENCOUNTER — Ambulatory Visit: Payer: Self-pay | Attending: Internal Medicine

## 2023-05-10 ENCOUNTER — Other Ambulatory Visit: Payer: Self-pay

## 2023-05-10 DIAGNOSIS — E538 Deficiency of other specified B group vitamins: Secondary | ICD-10-CM

## 2023-05-10 MED ORDER — CYANOCOBALAMIN 1000 MCG/ML IJ SOLN
1000.0000 ug | Freq: Once | INTRAMUSCULAR | Status: AC
  Administered 2023-05-10: 1000 ug via INTRAMUSCULAR

## 2023-05-10 NOTE — Progress Notes (Signed)
 cyanocobalamin (VITAMIN B12) injection 1,000 mcg  administered in right deltoid per protocols. Patient denies and pain or discomfort at injection site. Tolerated injection well no reaction.

## 2023-05-15 ENCOUNTER — Other Ambulatory Visit: Payer: Self-pay | Admitting: Critical Care Medicine

## 2023-05-15 DIAGNOSIS — I10 Essential (primary) hypertension: Secondary | ICD-10-CM

## 2023-05-17 ENCOUNTER — Other Ambulatory Visit: Payer: Self-pay

## 2023-05-17 MED ORDER — LOSARTAN POTASSIUM 100 MG PO TABS
100.0000 mg | ORAL_TABLET | Freq: Every day | ORAL | 1 refills | Status: DC
  Filled 2023-05-17: qty 90, 90d supply, fill #0
  Filled 2023-08-04: qty 30, 30d supply, fill #1

## 2023-05-17 NOTE — Telephone Encounter (Signed)
Requested Prescriptions  Pending Prescriptions Disp Refills   losartan (COZAAR) 100 MG tablet [Pharmacy Med Name: Losartan Potassium 100 MG Oral Tablet] 90 tablet 1    Sig: Take 1 tablet by mouth once daily     Cardiovascular:  Angiotensin Receptor Blockers Passed - 05/15/2023  7:51 AM      Passed - Cr in normal range and within 180 days    Creatinine, Ser  Date Value Ref Range Status  04/11/2023 0.58 0.57 - 1.00 mg/dL Final         Passed - K in normal range and within 180 days    Potassium  Date Value Ref Range Status  04/11/2023 4.3 3.5 - 5.2 mmol/L Final         Passed - Patient is not pregnant      Passed - Last BP in normal range    BP Readings from Last 1 Encounters:  04/11/23 126/77         Passed - Valid encounter within last 6 months    Recent Outpatient Visits           1 month ago Essential hypertension   [facility] [place]. [facility] Marcine Matar, MD   10 months ago SND (sensory-neural deafness), asymmetrical   [facility] [place]. [facility] Storm Frisk, MD   1 year ago Elevated liver enzymes   [facility] [facility] [place]. [facility] Storm Frisk, MD   1 year ago Elevated LFTs   [facility] [place]. [facility] [place], [place], [place]   1 year ago Elevated LDL cholesterol level   [facility] [place]. [facility] Storm Frisk, MD       Future Appointments             In 1 week Sharon Seller, [place] [facility] Of [place] Bridegroom. [facility]   In 2 months Laural Benes, Binnie Rail, MD [facility] - A Dept Of [place] [facility]. [facility]

## 2023-05-26 ENCOUNTER — Ambulatory Visit: Payer: Self-pay | Attending: Physician Assistant | Admitting: Physician Assistant

## 2023-05-26 ENCOUNTER — Other Ambulatory Visit: Payer: Self-pay

## 2023-05-26 ENCOUNTER — Encounter: Payer: Self-pay | Admitting: Physician Assistant

## 2023-05-26 VITALS — BP 121/76 | HR 66 | Ht 59.0 in | Wt 126.8 lb

## 2023-05-26 DIAGNOSIS — Z603 Acculturation difficulty: Secondary | ICD-10-CM

## 2023-05-26 DIAGNOSIS — R634 Abnormal weight loss: Secondary | ICD-10-CM

## 2023-05-26 DIAGNOSIS — H6123 Impacted cerumen, bilateral: Secondary | ICD-10-CM

## 2023-05-26 DIAGNOSIS — Z758 Other problems related to medical facilities and other health care: Secondary | ICD-10-CM

## 2023-05-26 DIAGNOSIS — M25832 Other specified joint disorders, left wrist: Secondary | ICD-10-CM

## 2023-05-26 DIAGNOSIS — M7661 Achilles tendinitis, right leg: Secondary | ICD-10-CM

## 2023-05-26 MED ORDER — PREDNISONE 10 MG PO TABS
10.0000 mg | ORAL_TABLET | Freq: Every day | ORAL | 0 refills | Status: DC
  Filled 2023-05-26: qty 5, 5d supply, fill #0

## 2023-05-26 NOTE — Progress Notes (Signed)
Patient ID: Claudia Hernandez, female   DOB: Mar 01, 1951, 72 y.o.   MRN: [id number]     Claudia Hernandez, is a 72 y.o. female  OZH:[id number]  GEX:[id number]  DOB - 1951-02-29  Chief Complaint  Patient presents with   Weight Loss       Subjective:   Claudia Hernandez is a 72 y.o. female here today for a follow up visit   and concerns that her wrist is not better after receiving injections.  She had an injury a long time ago.  Recent imaging showed: Posttraumatic changes consistent with wound old healed ulnar styloid  fracture.  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   She is also concerned with unintentional weight loss, but in the chart it appears to be fairly stable within a few pounds.  She has changed er det and has been eating much healthier  No problems updated.  ALLERGIES: Allergies  Allergen Reactions   Statins Other (See Comments)    ELEVATED LFTs    PAST MEDICAL HISTORY: Past Medical History:  Diagnosis Date   Arthritis    Asthma    hx of   Chronic diffuse otitis externa of both ears 08/04/2021   Heart murmur    unsure of this, but was told she had one by MD in [place]   Hypertension     MEDICATIONS AT HOME: Prior to Admission medications   Medication Sig Start Date End Date Taking? Authorizing Provider  ACETAMINOPHEN PO Take by mouth. PRN   Yes [provider]  carbamide peroxide (DEBROX) 6.5 % OTIC solution Place 5 drops into both ears daily. 04/11/23  Yes Marcine Matar, MD  COLLAGEN PO Take by mouth.   Yes [provider]  ezetimibe (ZETIA) 10 MG tablet Take 1 tablet (10 mg total) by mouth daily. 07/21/22  Yes Storm Frisk, MD  losartan (COZAAR) 100 MG tablet Take 1 tablet (100 mg total) by mouth daily. 05/17/23  Yes Marcine Matar, MD  hydrocortisone cream 0.5 % Apply 1 Application topically 2 (two) times daily. Patient not taking: Reported on 05/26/2023 09/21/22   Raspet, Noberto Retort, PA-C  losartan (COZAAR)  100 MG tablet Take 1 tablet by mouth once daily Patient not taking: Reported on 05/26/2023 05/17/23   Marcine Matar, MD  meloxicam (MOBIC) 7.5 MG tablet Take 1 tablet (7.5 mg total) by mouth daily. Patient not taking: Reported on 05/26/2023 04/11/23   Marcine Matar, MD  olopatadine (PATADAY) 0.1 % ophthalmic solution Place 1 drop into both eyes 2 (two) times daily. Patient not taking: Reported on 05/26/2023 09/21/22   Raspet, Noberto Retort, PA-C  predniSONE (DELTASONE) 10 MG tablet Take 1 tablet (10 mg total) by mouth daily with breakfast. 05/26/23   Waylon Koffler, Marzella Schlein, PA-C    ROS: Neg HEENT Neg resp Neg cardiac Neg GI Neg GU  Neg psych Neg neuro  Objective:   Vitals:   05/26/23 1103  BP: 121/76  Pulse: 66  SpO2: 98%  Weight: 126 lb 12.8 oz (57.5 kg)  Height: 4\' 11"  (1.499 m)   Exam General appearance : Awake, alert, not in any distress. Speech Clear. Not toxic looking HEENT: Atraumatic and Normocephalic Neck: Supple, no JVD. No cervical lymphadenopathy.  Chest: Good air entry bilaterally, CTAB.  No rales/rhonchi/wheezing CVS: S1 S2 regular, no murmurs.  Extremities: B/L Lower Ext shows no edema, both legs are warm to touch Neurology: Awake alert, and oriented X 3, CN II-XII intact, Non focal  Skin: No Rash  Data Review Lab Results  Component Value Date   HGBA1C 5.2 08/04/2021    Assessment & Plan   1. Bilateral impacted cerumen (Primary) - Ambulatory referral to ENT  2. Ulnar abutment syndrome of left wrist - predniSONE (DELTASONE) 10 MG tablet; Take 1 tablet (10 mg total) by mouth daily with breakfast.  Dispense: 5 tablet; Refill: 0 - Ambulatory referral to Hand Surgery  3. Language barrier [facility] interpreters used and additional time performing visit was required.   4. Bilateral hearing loss due to cerumen impaction - Ambulatory referral to ENT  5. Weight loss - Thyroid Panel With TSH; Future    Return for PCP for chronic conditions.  The patient  was given clear instructions to go to ER or return to medical center if symptoms don't improve, worsen or new problems develop. The patient verbalized understanding. The patient was told to call to get lab results if they haven't heard anything in the next week.      Claudia Co, PA-C [facility] [place], [place] [phone]   05/30/2023, 2:21 PM

## 2023-06-07 ENCOUNTER — Ambulatory Visit: Payer: Self-pay | Attending: Family Medicine

## 2023-06-07 DIAGNOSIS — R634 Abnormal weight loss: Secondary | ICD-10-CM

## 2023-06-08 LAB — THYROID PANEL WITH TSH
Free Thyroxine Index: 2.2 (ref 1.2–4.9)
T3 Uptake Ratio: 24 % (ref 24–39)
T4, Total: 9 ug/dL (ref 4.5–12.0)
TSH: 3.34 u[IU]/mL (ref 0.450–4.500)

## 2023-06-13 ENCOUNTER — Ambulatory Visit (INDEPENDENT_AMBULATORY_CARE_PROVIDER_SITE_OTHER): Payer: Self-pay | Admitting: Physician Assistant

## 2023-06-13 ENCOUNTER — Encounter (INDEPENDENT_AMBULATORY_CARE_PROVIDER_SITE_OTHER): Payer: Self-pay | Admitting: Physician Assistant

## 2023-06-13 ENCOUNTER — Telehealth: Payer: Self-pay

## 2023-06-13 DIAGNOSIS — H6123 Impacted cerumen, bilateral: Secondary | ICD-10-CM

## 2023-06-13 DIAGNOSIS — H9190 Unspecified hearing loss, unspecified ear: Secondary | ICD-10-CM

## 2023-06-13 DIAGNOSIS — H9193 Unspecified hearing loss, bilateral: Secondary | ICD-10-CM

## 2023-06-13 NOTE — Telephone Encounter (Signed)
 Pt was called and is aware of results, DOB was confirmed.   Interpreter id # [phone]

## 2023-06-13 NOTE — Telephone Encounter (Signed)
-----   Message from Georgian Co sent at 06/13/2023 12:13 PM EST ----- Thyroid levels are normal.  Thanks, Georgian Co, PA-C

## 2023-06-13 NOTE — Progress Notes (Signed)
 Dear Dr. Danton, Here is my assessment for our mutual patient, Claudia Hernandez. Thank you for allowing me the opportunity to care for your patient. Please do not hesitate to contact me should you have any other questions. Sincerely, Chyrl Cohen PA-C  Otolaryngology Clinic Note Referring provider: Dr. Danton HPI:  Claudia Hernandez is a 73 y.o. female kindly referred by Dr. Danton   The patient presents today for evaluation of cerumen impaction.  The patient notes that she has had ongoing issues with cerumen in the bilateral ears.  She is from [place] and has had her ears cleaned on a regular basis.  She notes that recently she has had decreased hearing secondary to the cerumen.  She saw her primary care provider who noted she had wax in the ears and referred her to our office.  She notes the decreased hearing started approximately 1 year ago, it is in both the ears, and equal.  She denies any pain, ringing, dizziness, head or neck trauma, no head or neck surgeries.  She has no history of recurrent ear infections as a child or an adult, no family history of hearing loss.    H&N Surgery: none   Independent Review of Additional Tests or Records:  none   PMH/Meds/All/SocHx/FamHx/ROS:   Past Medical History:  Diagnosis Date   Arthritis    Asthma    hx of   Chronic diffuse otitis externa of both ears 08/04/2021   Heart murmur    unsure of this, but was told she had one by MD in [place]   Hypertension      Past Surgical History:  Procedure Laterality Date   BLADDER SURGERY     CESAREAN SECTION     CHOLECYSTECTOMY     gallbladder removed     left foot surgery      Family History  Problem Relation Age of Onset   Drug abuse Neg Hx    Hypertension Neg Hx    Colon cancer Neg Hx    Esophageal cancer Neg Hx    Rectal cancer Neg Hx    Stomach cancer Neg Hx    Breast cancer Neg Hx      Social Connections: Not on file      Current Outpatient Medications:     ACETAMINOPHEN  PO, Take by mouth. PRN, Disp: , Rfl:    carbamide peroxide (DEBROX) 6.5 % OTIC solution, Place 5 drops into both ears daily., Disp: 15 mL, Rfl: 0   COLLAGEN PO, Take by mouth., Disp: , Rfl:    ezetimibe  (ZETIA ) 10 MG tablet, Take 1 tablet (10 mg total) by mouth daily., Disp: 90 tablet, Rfl: 3   hydrocortisone  cream 0.5 %, Apply 1 Application topically 2 (two) times daily. (Patient not taking: Reported on 05/26/2023), Disp: 30 g, Rfl: 0   losartan  (COZAAR ) 100 MG tablet, Take 1 tablet by mouth once daily (Patient not taking: Reported on 05/26/2023), Disp: 90 tablet, Rfl: 1   losartan  (COZAAR ) 100 MG tablet, Take 1 tablet (100 mg total) by mouth daily., Disp: 90 tablet, Rfl: 1   meloxicam  (MOBIC ) 7.5 MG tablet, Take 1 tablet (7.5 mg total) by mouth daily. (Patient not taking: Reported on 05/26/2023), Disp: 30 tablet, Rfl: 3   olopatadine  (PATADAY ) 0.1 % ophthalmic solution, Place 1 drop into both eyes 2 (two) times daily. (Patient not taking: Reported on 05/26/2023), Disp: 5 mL, Rfl: 0   predniSONE  (DELTASONE ) 10 MG tablet, Take 1 tablet (10 mg total) by mouth daily  with breakfast., Disp: 5 tablet, Rfl: 0  Current Facility-Administered Medications:    cyanocobalamin  (VITAMIN B12) injection 1,000 mcg, 1,000 mcg, Intramuscular, Once,    Physical Exam:   There were no vitals taken for this visit.  Pertinent Findings  CN II-XII intact  Bilateral EAC with impacted cerumen  Weber 512: equal Rinne 512: AC > BC b/l  Anterior rhinoscopy: Septum midline No lesions of oral cavity/oropharynx; dentition WNL No obviously palpable neck masses/lymphadenopathy/thyromegaly No respiratory distress or stridor  Seprately Identifiable Procedures:  Procedure: Bilateral ear microscopy and cerumen removal using microscope (CPT 69210) - Mod 50 Pre-procedure diagnosis:Cerumen impaction bilateral external ears Post-procedure diagnosis: same Indication: cerumen impaction; given patient's otologic  complaints and history as well as for improved and comprehensive examination of external ear and tympanic membrane, bilateral otologic examination using microscope was performed and impacted cerumen removed  Procedure: Patient was placed semi-recumbent. Both ear canals were examined using the microscope with findings above. Cerumen removed on left and on right using suction and currette with improvement in EAC examination and patency. Left: EAC was patent. TM was intact . Middle ear was aerated. Drainage: none Right: EAC was patent. TM was intact. Middle ear was aerated. Drainage: none Patient tolerated the procedure well.     Impression & Plans:  Claudia Hernandez is a 73 y.o. female with the following   Cerumen impaction-  Bilateral ears were disimpacted with no issues  Hearing loss-  She endorses symmetric hearing loss after cerumen removal. I would recommend audiogram, I would like to see her to discuss results after audiogram.    - f/u after audiogram    Thank you for allowing me the opportunity to care for your patient. Please do not hesitate to contact me should you have any other questions.  Sincerely, Chyrl Cohen PA-C [facility] Phone: [phone] Fax: [phone]  06/13/2023, 9:59 AM

## 2023-06-14 ENCOUNTER — Ambulatory Visit: Payer: Self-pay | Admitting: Orthopaedic Surgery

## 2023-06-16 ENCOUNTER — Telehealth (INDEPENDENT_AMBULATORY_CARE_PROVIDER_SITE_OTHER): Payer: Self-pay | Admitting: Physician Assistant

## 2023-06-16 NOTE — Progress Notes (Signed)
  [address] [place], [place] [address] [phone]  Audiological Evaluation    Name: Claudia Hernandez     DOB:   09/14/1950      MRN:   [id number]                                                                                     Service Date: 06/16/2023     Accompanied by: son   Patient comes today after Reyes Cohen, PA-C sent a referral for a hearing evaluation due to concerns with hearing loss. Patient was tested by a Spanish -speaking audiologist.  Symptoms Yes Details  Hearing loss  [x]  Both ears  Tinnitus  []    Ear pain/ Ear infections  []    Balance problems  []    Noise exposure  []    Previous ear surgeries  []    Family history  []    Amplification  []    Other  []      Otoscopy: Right ear: Clear external ear canals and notable landmarks visualized on the tympanic membrane. Left ear:  Clear external ear canals and notable landmarks visualized on the tympanic membrane.  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    The hearing test results were completed under headphones and results are deemed to be of good reliability. Test technique:  conventional     Speech Audiometry: Right ear- Speech Reception Threshold (SRT) was obtained at 40 dBHL Left ear-Speech Reception Threshold (SRT) was obtained at 40 dBHL   Word Recognition Score Tested using Spanish list ( MLV) Right ear: 92% was obtained at a presentation level of 80 dBHL with contralateral masking which is deemed as  excellent Left ear: 88% was obtained at a presentation level of 80 dBHL with contralateral masking which is deemed as  good     Impression: There is not a  significant difference in pure-tone thresholds between ears. There is not a significant difference in the word recognition score in between ears.    Recommendations: Follow up with ENT as scheduled for today. Return for a hearing evaluation if concerns with hearing changes arise or per MD recommendation. Consider a communication needs assessment after medical clearance for hearing aids is obtained.   Jolleen Seman MARIE LEROUX-MARTINEZ, AUD

## 2023-06-16 NOTE — Telephone Encounter (Signed)
 Confirmed appt & location [place]

## 2023-06-17 ENCOUNTER — Ambulatory Visit (INDEPENDENT_AMBULATORY_CARE_PROVIDER_SITE_OTHER): Payer: Self-pay | Admitting: Audiology

## 2023-06-17 ENCOUNTER — Ambulatory Visit (INDEPENDENT_AMBULATORY_CARE_PROVIDER_SITE_OTHER): Payer: Self-pay

## 2023-06-17 DIAGNOSIS — H903 Sensorineural hearing loss, bilateral: Secondary | ICD-10-CM

## 2023-06-17 DIAGNOSIS — H9193 Unspecified hearing loss, bilateral: Secondary | ICD-10-CM

## 2023-06-17 NOTE — Progress Notes (Signed)
 Dear Dr. Vicci, Here is my assessment for our mutual patient, Claudia Hernandez. Thank you for allowing me the opportunity to care for your patient. Please do not hesitate to contact me should you have any other questions. Sincerely, Chyrl Cohen PA-C  Otolaryngology Clinic Note Referring provider: Dr. Vicci HPI:  Claudia Hernandez is a 73 y.o. female kindly referred by Dr. Vicci   The patient is here to discuss her audiology results. She was last seen in the office on 06/12/2022.  Below is a recap of that encounter.  The patient presents today for evaluation of cerumen impaction.  The patient notes that she has had ongoing issues with cerumen in the bilateral ears.  She is from [place] and has had her ears cleaned on a regular basis.  She notes that recently she has had decreased hearing secondary to the cerumen.  She saw her primary care provider who noted she had wax in the ears and referred her to our office.  She notes the decreased hearing started approximately 1 year ago, it is in both the ears, and equal.  She denies any pain, ringing, dizziness, head or neck trauma, no head or neck surgeries.  She has no history of recurrent ear infections as a child or an adult, no family history of hearing loss.  Cerumen impaction-   Bilateral ears were disimpacted with no issues   Hearing loss-   She endorses symmetric hearing loss after cerumen removal. I would recommend audiogram, I would like to see her to discuss results after audiogram.   Update 06/16/2022-  She denies any significant changes since her last office visit. She notes me itchiness to left left ear, no pain.       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 sensorineural hearing loss from 250 Hz - 8000 Hz, in both ears  SNHL= Sensorineural hearing loss     PMH/Meds/All/SocHx/FamHx/ROS:   Past Medical History:  Diagnosis Date   Arthritis    Asthma    hx of   Chronic diffuse otitis externa of both ears 08/04/2021   Heart murmur    unsure of this, but was told she had one by MD in [place]   Hypertension      Past Surgical History:  Procedure Laterality Date   BLADDER SURGERY     CESAREAN SECTION     CHOLECYSTECTOMY     gallbladder removed     left foot surgery      Family History  Problem Relation Age of Onset   Drug abuse Neg Hx    Hypertension Neg Hx    Colon cancer Neg Hx    Esophageal cancer Neg Hx    Rectal cancer Neg Hx    Stomach cancer Neg Hx    Breast cancer Neg Hx      Social Connections: Not on file      Current Outpatient Medications:    ACETAMINOPHEN  PO, Take by mouth. PRN, Disp: , Rfl:    carbamide peroxide (DEBROX) 6.5 % OTIC solution, Place 5 drops into both ears daily., Disp: 15 mL, Rfl: 0   COLLAGEN PO, Take by mouth., Disp: , Rfl:    ezetimibe  (ZETIA ) 10 MG tablet, Take 1 tablet (10 mg total) by mouth daily., Disp: 90 tablet, Rfl: 3   hydrocortisone  cream 0.5 %, Apply 1 Application topically 2 (two) times daily. (Patient not  taking: Reported on 05/26/2023), Disp: 30 g, Rfl: 0   losartan  (COZAAR ) 100 MG tablet, Take 1 tablet by mouth once daily (Patient not taking: Reported on 05/26/2023), Disp: 90 tablet, Rfl: 1   losartan  (COZAAR ) 100 MG tablet, Take 1 tablet (100 mg total) by mouth daily., Disp: 90 tablet, Rfl: 1   meloxicam  (MOBIC ) 7.5 MG tablet, Take 1 tablet (7.5 mg total) by mouth daily. (Patient not taking: Reported on 05/26/2023), Disp: 30 tablet, Rfl: 3   olopatadine  (PATADAY ) 0.1 % ophthalmic solution, Place 1 drop into both eyes 2 (two) times daily. (Patient not taking: Reported on 05/26/2023), Disp: 5 mL, Rfl: 0   predniSONE  (DELTASONE ) 10 MG tablet, Take 1 tablet (10 mg total) by mouth daily with breakfast., Disp: 5 tablet, Rfl: 0  Current Facility-Administered Medications:     cyanocobalamin  (VITAMIN B12) injection 1,000 mcg, 1,000 mcg, Intramuscular, Once,    Physical Exam:   There were no vitals taken for this visit.  Pertinent Findings  CN II-XII intact Bilateral EAC clear and TM intact with well pneumatized middle ear spaces Anterior rhinoscopy: Septum midline No obviously palpable neck masses/lymphadenopathy/thyromegaly No respiratory distress or stridor  Seprately Identifiable Procedures:  None  Impression & Plans:  Claudia Hernandez is a 73 y.o. female with the following   Hearing loss-  The patient returns today for discussion of audiogram. She does have symmetric hearing loss that would benefit from hearing aids. She was given recommendations for this. She may follow up PRN.       - f/u PRN   Thank you for allowing me the opportunity to care for your patient. Please do not hesitate to contact me should you have any other questions.  Sincerely, Chyrl Cohen PA-C [facility] Phone: [phone] Fax: [phone]  06/17/2023, 10:09 AM

## 2023-06-17 NOTE — Patient Instructions (Signed)
 To help with your buildup of earwax try using sweet oil. This can be purchased over the counter at your local pharmacy. Using a sterilized ear dropper, place a few drops into your ear. Cover the ear with a cotton ball, or warm compress, for 5 to 10 minutes. Rub gently. Wipe out any excess ear wax, and the oil, with a cotton ball or a wet cloth.

## 2023-07-04 ENCOUNTER — Ambulatory Visit (INDEPENDENT_AMBULATORY_CARE_PROVIDER_SITE_OTHER): Payer: Self-pay | Admitting: Orthopedic Surgery

## 2023-07-04 ENCOUNTER — Other Ambulatory Visit (INDEPENDENT_AMBULATORY_CARE_PROVIDER_SITE_OTHER): Payer: Self-pay

## 2023-07-04 DIAGNOSIS — M25532 Pain in left wrist: Secondary | ICD-10-CM

## 2023-07-04 MED ORDER — BETAMETHASONE SOD PHOS & ACET 6 (3-3) MG/ML IJ SUSP
6.0000 mg | INTRAMUSCULAR | Status: AC | PRN
  Administered 2023-07-04: 6 mg via INTRA_ARTICULAR

## 2023-07-04 MED ORDER — LIDOCAINE HCL 1 % IJ SOLN
1.0000 mL | INTRAMUSCULAR | Status: AC | PRN
  Administered 2023-07-04: 1 mL

## 2023-07-04 NOTE — Progress Notes (Signed)
Preslea Rhodus [place] - 73 y.o. female MRN [id number]  Date of birth: Jul 11, 1950  Office Visit Note: Visit Date: 07/04/2023 PCP: Marcine Matar, MD Referred by: Anders Simmonds, PA-C  Subjective: Chief Complaint  Patient presents with   Left Wrist - Pain   HPI: Lajoyce Tamura Robb Matar is a pleasant 73 y.o. female who presents today for evaluation of ongoing left ulnar-sided wrist pain that has been present now for roughly 2 years.  She states that she does have a remote history of an injury to the wrist as a child which went with nonoperative care.  She was seen recently by my partner Dr. Roda Shutters, who referred her to me for ongoing ulnar abutment syndrome.  She denies any numbness or tingling.  Pertinent ROS were reviewed with the patient and found to be negative unless otherwise specified above in HPI.   Visit Reason: Left wrist pain Duration of symptoms: 2 year Hand dominance: right Occupation: Does not work  Diabetic: No Smoking: No Heart/Lung History: No  Blood Thinners: No  Prior Testing/EMG: No Injections (Date): 09/2022 (wrist, radiocarpal) Prior Surgery: No  Assessment & Plan: Visit Diagnoses:  1. Pain in left wrist     Plan: Extensive discussion was had with the patient today with the aid of an interpreter to help her understand the causality of her ongoing left ulnar-sided wrist pain.  I reviewed in detail her x-rays with her today as well as the bilateral grip view which does show significant ulnar abutment and degeneration at the DRUJ interface of her left wrist.  I explained that this is a chronic condition, with the ongoing abutment, this better explains why she is having significant ulnar-sided wrist pain refractory to some previous conservative care.  We discussed potential treatment options ranging from bracing, injections, anti-inflammatory medication, therapy and finally surgical options.  From a surgical standpoint, I discussed with her the  possibility for a ulnar shortening procedure and DRUJ fusion, consistent with a Sauve Kapanji style surgery.  I discussed the risks and benefits of this operation as well as the postoperative protocol.  Understanding her options, she would like to trial a repeat injection to the left wrist which I am in agreement with.  Injection was performed to the ulnar aspect of the wrist today in order to help try and alleviate some of her ulnar abutment symptoms.  I did explain that there is a certain mechanical component to this that could supersede the ability for the injection to give her complete relief.  She expressed understanding.  She will return to me in approximate 3 months time for recheck.  Follow-up: No follow-ups on file.   Meds & Orders: No orders of the defined types were placed in this encounter.   Orders Placed This Encounter  Procedures   XR Wrist Complete Left   Ambulatory referral to Occupational Therapy     Procedures: Medium Joint Inj (Left ulnar carpal region) on 07/04/2023 1:49 PM Indications: pain Details: 25 G needle, dorsal approach Medications: 1 mL lidocaine 1 %; 6 mg betamethasone acetate-betamethasone sodium phosphate 6 (3-3) MG/ML Outcome: tolerated well, no immediate complications Procedure, treatment alternatives, risks and benefits explained, specific risks discussed.          Clinical History: No specialty comments available.  She reports that she has never smoked. She has never used smokeless tobacco. No results for input(s): "HGBA1C", "LABURIC" in the last 8760 hours.  Objective:   Vital Signs: There were no vitals  taken for this visit.  Physical Exam  Gen: Well-appearing, in no acute distress; non-toxic CV: Regular Rate. Well-perfused. Warm.  Resp: Breathing unlabored on room air; no wheezing. Psych: Fluid speech in conversation; appropriate affect; normal thought process  Ortho Exam PHYSICAL EXAM:  General: Patient is well appearing and in no  distress. Cervical spine mobility is full in all directions:  Skin and Muscle: No significant skin changes are apparent to upper extremities.   Range of Motion and Palpation Tests: Mobility is full about the elbows with flexion and extension.  Forearm supination and pronation right wrist 80/80, left wrist 65/65.  Wrist flexion/extension is 75/65 right, left 60/55.  Digital flexion and extension are full.  Thumb opposition is full to the base of the small fingers bilaterally.    Notable tenderness over the foveal region and ulnar aspect of the left wrist.  Notable instability with DRUJ stress testing including notable crepitus and pain.  Neurologic, Vascular, Motor: Sensation is intact to light touch in the median/radial/ulnar distributions.   Fingers pink and well perfused.  Capillary refill is brisk.      Lab Results  Component Value Date   HGBA1C 5.2 08/04/2021     Imaging: XR Wrist Complete Left Result Date: 07/04/2023 X-rays of the left wrist, multiple views including the bilateral pronated grip view were obtained today X-rays do show significant ulnar positivity and abutment at the left wrist in comparison to the right side.  There is also notable degeneration at the DRUJ interval of the left wrist.  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   Past Medical/Family/Surgical/Social History: Medications & Allergies reviewed per EMR, new medications updated. Patient Active Problem List   Diagnosis Date Noted   Vitamin B 12 deficiency 04/14/2023   Wrist pain, acute, left 07/22/2022   Statin intolerance 02/19/2022   Arthritis of finger of left hand 02/18/2022   Elevated liver enzymes 02/18/2022   SND (sensory-neural deafness), asymmetrical 02/18/2022   Bilateral knee pain 03/24/2021   Elevated LDL cholesterol level 06/17/2020   Bilateral hearing loss due to cerumen impaction 06/16/2020   Toe pain, right 05/13/2020   Essential  hypertension 05/13/2020   Past Medical History:  Diagnosis Date   Arthritis    Asthma    hx of   Chronic diffuse otitis externa of both ears 08/04/2021   Heart murmur    unsure of this, but was told she had one by MD in [place]   Hypertension    Family History  Problem Relation Age of Onset   Drug abuse Neg Hx    Hypertension Neg Hx    Colon cancer Neg Hx    Esophageal cancer Neg Hx    Rectal cancer Neg Hx    Stomach cancer Neg Hx    Breast cancer Neg Hx    Past Surgical History:  Procedure Laterality Date   BLADDER SURGERY     CESAREAN SECTION     CHOLECYSTECTOMY     gallbladder removed     left foot surgery     Social History   Occupational History   Not on file  Tobacco Use   Smoking status: Never   Smokeless tobacco: Never  Vaping Use   Vaping status: Never Used  Substance and Sexual Activity   Alcohol use: Not Currently   Drug use: Never   Sexual activity: Not on file    Arfa Lamarca Fara Boros) Denese Killings, M.D. [facility] 1:49 PM

## 2023-07-08 ENCOUNTER — Ambulatory Visit: Payer: Self-pay | Admitting: Internal Medicine

## 2023-07-08 ENCOUNTER — Other Ambulatory Visit: Payer: Self-pay

## 2023-07-08 NOTE — Telephone Encounter (Signed)
Noted appointment on 07/16/2022

## 2023-07-08 NOTE — Telephone Encounter (Signed)
  Chief Complaint: Interpreter [phone]: Patient reports tremors in her right hand that have been going on for quite some time. Patient requesting to be scheduled  with a doctor. Patient denies any weakness or numbness. Denies any pain. Review of not patient is being followed with orthopedic however they have not seen patient for this right hand tremor.  Symptoms:  Tremors  in right hand Frequency:  Quite some time Pertinent Negatives: Patient denies weakness, numbness nor pain. Disposition: [] ED /[] Urgent Care (no appt availability in office) / [x] Appointment(In office/virtual)/ []  [facility]/ [] Home Care/ [] Refused Recommended Disposition /[] [facility]/ []  Follow-up with PCP Additional Notes:  Scheduled patient with her provider. Offered another provider or Urgent care. Patient declined because she only want to see her doctor.    Reason for Disposition  Hand or wrist pain is a chronic symptom (recurrent or ongoing AND present > 4 weeks)  Answer Assessment - Initial Assessment Questions 1. SYMPTOM: "What is the main symptom you are concerned about?" (e.g., weakness, numbness)      Right hand  Tremors in hand 2. ONSET: "When did this start?" (minutes, hours, days; while sleeping)        This has been going on for sometime. Diagnosed with tremors in the opposite hand  3. LAST NORMAL: "When was the last time you (the patient) were normal (no symptoms)?"    Unsure.   4. PATTERN "Does this come and go, or has it been constant since it started?"  "Is it present now?"      Constant  in this hand 5. CARDIAC SYMPTOMS: "Have you had any of the following symptoms: chest pain, difficulty breathing, palpitations?"      Denies. 6. NEUROLOGIC SYMPTOMS: "Have you had any of the following symptoms: headache, dizziness, vision loss, double vision, changes in speech, unsteady on your feet?"      Denies. 7. OTHER SYMPTOMS: "Do you have any other symptoms?"       Tremors in opposite  hand   for a while. Also follow with Orthopedic. 8. PREGNANCY: "Is there any chance you are pregnant?" "When was your last menstrual period?"     NA  Protocols used: Neurologic Deficit-A-AH, Hand and Wrist Pain-A-AH

## 2023-07-08 NOTE — Telephone Encounter (Signed)
Copied from CRM [id number]. Topic: Appointments - Appointment Cancel/Reschedule >> Jul 08, 2023 12:01 PM Marlow Baars wrote: The patient called in asking if she can get an earlier appt if possible. I will check and if so reschedule and make sure she is at least on the wait list. I am going to warm transfer to E2C2 NT as directed

## 2023-07-13 NOTE — Therapy (Signed)
 OUTPATIENT OCCUPATIONAL THERAPY ORTHO EVALUATION  Patient Name: Claudia Hernandez MRN: [id number] DOB:[birth year], 73 y.o., female Today's Date: 07/14/2023  PCP: Vicci Spear, MD REFERRING PROVIDER:  Arlinda Buster, MD    END OF SESSION:  OT End of Session - 07/14/23 9061     Visit Number 1    Number of Visits 8    Date for OT Re-Evaluation 08/26/23    Authorization Type N/A?    OT Start Time 0938    OT Stop Time 1020    OT Time Calculation (min) 42 min    Equipment Utilized During Treatment orthotic materials    Activity Tolerance Patient tolerated treatment well;No increased pain;Patient limited by pain    Behavior During Therapy [facility] for tasks assessed/performed             Past Medical History:  Diagnosis Date   Arthritis    Asthma    hx of   Chronic diffuse otitis externa of both ears 08/04/2021   Heart murmur    unsure of this, but was told she had one by MD in [place]   Hypertension    Past Surgical History:  Procedure Laterality Date   BLADDER SURGERY     CESAREAN SECTION     CHOLECYSTECTOMY     gallbladder removed     left foot surgery     Patient Active Problem List   Diagnosis Date Noted   Vitamin B 12 deficiency 04/14/2023   Wrist pain, acute, left 07/22/2022   Statin intolerance 02/19/2022   Arthritis of finger of left hand 02/18/2022   Elevated liver enzymes 02/18/2022   SND (sensory-neural deafness), asymmetrical 02/18/2022   Bilateral knee pain 03/24/2021   Elevated LDL cholesterol level 06/17/2020   Bilateral hearing loss due to cerumen impaction 06/16/2020   Toe pain, right 05/13/2020   Essential hypertension 05/13/2020    ONSET DATE: chronic   REFERRING DIAG: M25.532 (ICD-10-CM) - Pain in left wrist   THERAPY DIAG:  Pain in left wrist  Localized edema  Muscle weakness (generalized)  Stiffness of left wrist, not elsewhere classified  Rationale for Evaluation and Treatment: Rehabilitation  SUBJECTIVE:    SUBJECTIVE STATEMENT: She arrives with interpretor today and a younger family member both who speak with her and help explain.  She states chronic Lt wrist pain that primarily began ~ 1 year ago.   She states primary activity of pain is washing dishes, and she does not use her dishwasher.     PERTINENT HISTORY: Per MD note: ongoing left ulnar-sided wrist pain that has been present now for roughly 2 years. She states that she does have a remote history of an injury to the wrist as a child which went with nonoperative care. She was seen recently by my partner Dr. Jerri, who referred her to me for ongoing ulnar abutment syndrome.SABRASABRAX-rays do show significant ulnar positivity and abutment at the left wrist in comparison to the right side. There is also notable degeneration at the DRUJ interval of the left wrist. 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    PRECAUTIONS: None  RED FLAGS: None   WEIGHT BEARING RESTRICTIONS: Yes she was recommended to not lift anything heavy for several weeks, likely less than 10 pounds  PAIN:  Are you having pain? Yes: NPRS scale: 1-2/10 at rest, but up to 6-7/10 pain with activities  Pain location: Left wrist ulnarly Pain description: Aching and sore Aggravating factors: Motion and weightbearing  Relieving factors: Rest and heat  FALLS: Has patient fallen in last 6 months? No  PLOF: Independent  PATIENT GOALS: To improve pain in the left wrist  NEXT MD VISIT: As needed   OBJECTIVE: (All objective assessments below are from initial evaluation on: 07/14/23 unless otherwise specified.)    HAND DOMINANCE: Right   ADLs: Overall ADLs: States decreased ability to grab, hold household objects, pain and difficulty to open containers; she states washing dishes is especially painful and problematic activity for her   UPPER EXTREMITY ROM     Shoulder to Wrist AROM Right eval Left eval  Wrist flexion 55 43   Wrist extension 75 46  (Blank rows = not tested)   Hand AROM Right eval Left eval  Full Fist Ability (or Gap to Distal Palmar Crease) Full Full but tender  Thumb Opposition  (Kapandji Scale)  [facility] WFL  (Blank rows = not tested)   UPPER EXTREMITY MMT:    Eval:  NT at eval due to high pain and tenderness on palpation at the left wrist. Will be tested when appropriate.  No significant deficits noted in the right side  MMT Left TBD  Elbow flexion   Elbow extension   Forearm supination   Forearm pronation   Wrist flexion   Wrist extension   Wrist ulnar deviation   Wrist radial deviation   (Blank rows = not tested)  HAND FUNCTION: Eval: Observed weakness in affected left hand.  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  Some obvious arthritis in there as well.  She was extremely tender to touch today with palpable tendinitis directly over her ulnar wrist area, and is likely that ECU and FCU are irritated by the deformed ulnar bone structure.   TODAY'S TREATMENT:  Post-evaluation treatment:   For self-care/safety she was educated to rest her sore left arm due to likely tendinitis as well as arthritis and inflammation.  To help her do this, OT custom fabricated an ulnar gutter wrist immobilization orthosis to help immobilize and rest her wrist. It fit well with no areas of pressure, pt states a comfortable fit. Pt was educated on the wearing schedule (on as much as tolerated except  for hygiene), to avoid exposing it to sources of heat, to wipe clean as needed (do not wash, use harsh detergents), to call or come in ASAP if it is causing any irritation or is not achieving desired function. It will be checked/adjusted in upcoming sessions, as needed.   Pt states understanding all directions.     PATIENT EDUCATION: Education details: See tx section above for details  Person educated: Patient Education method: Verbal Instruction, Teach back, Handouts  Education comprehension: States and demonstrates understanding, Additional Education required    HOME EXERCISE PROGRAM: See tx section above for details    GOALS: Goals reviewed with patient? Yes   SHORT TERM GOALS: (STG required if POC>30 days) Target Date: 07/22/2023  Pt will obtain protective, custom orthotic. Goal status: 07/14/2023: MET  2.  Pt will demo/state understanding of initial HEP to improve pain levels and prerequisite motion. Goal status: INITIAL   LONG TERM GOALS: Target Date: 08/26/2023  Pt will improve grip strength in left hand from TBD lbs to at least TBD lbs for functional use at home and in IADLs. Goal status: INITIAL-TBD baseline measure next session  2.  Pt will improve A/ROM in left wrist flexion/extension by at least 10 degrees each, to have functional motion for tasks like reach and grasp.  Goal status: INITIAL  3.  Pt will improve strength in left wrist flexion/extension from apparent 3 -/5 MMT to at least 4/5 MMT to have increased functional ability to carry out selfcare and higher-level homecare tasks with less difficulty. Goal status: INITIAL  4.  Pt will improve coordination skills in left hand and arm, as seen by within functional limit score on box and blocks testing to have increased functional ability to carry out fine motor tasks (fasteners, etc.) and more complex, coordinated IADLs (meal prep, sports, etc.).  Goal status: INITIAL  5.  Pt will decrease pain at worst from  6-7/10 to 2/10 or better to have better sleep and occupational participation in daily roles. Goal status: INITIAL   ASSESSMENT:  CLINICAL IMPRESSION: Patient is a 73  y.o. female who was seen today for occupational therapy evaluation for left wrist pain which seems to come from chronic fracture as well as arthritis and tendinitis over the deformed ulnar head.  She is also been working herself fairly hard without taking rest breaks.  Due to high tenderness to palpation today, we will try a week of rest and a new custom orthosis that was fabricated for her today.  If less tender next week, we will advance towards more exercises and activities to help reduce tenderness and irritability.  She will benefit from outpatient occupational therapy to increase quality of life and use of left hand.   PERFORMANCE DEFICITS: in functional skills including ADLs, IADLs, coordination, ROM, strength, pain, fascial restrictions, muscle spasms, Gross motor control, body mechanics, endurance, decreased knowledge of precautions, and UE functional use, cognitive skills including energy/drive and problem solving, and psychosocial skills including coping strategies, habits, and routines and behaviors.   IMPAIRMENTS: are limiting patient from ADLs, IADLs, work, and leisure.   COMORBIDITIES: may have co-morbidities  that affects occupational performance. Patient will benefit from skilled OT to address above impairments and improve overall function.  MODIFICATION OR ASSISTANCE TO COMPLETE EVALUATION: No modification of tasks or assist necessary to complete an evaluation.  OT OCCUPATIONAL PROFILE AND HISTORY: Problem focused assessment: Including review of records relating to presenting problem.  CLINICAL DECISION MAKING: Moderate - several treatment options, min-mod task modification necessary  REHAB POTENTIAL: Good  EVALUATION COMPLEXITY: Low      PLAN:  OT FREQUENCY: 1-2x/week  OT DURATION: 6 weeks through  08/26/2023 as needed and up to 8 total visits  PLANNED INTERVENTIONS: 97168 OT Re-evaluation, 97535 self care/ADL training, 02889 therapeutic exercise, 97530 therapeutic activity, 97112 neuromuscular re-education, 97140 manual therapy, 97039 fluidotherapy, 97010 moist heat, 97010 cryotherapy, 97760 Orthotics management and training, 02239 Splinting (initial encounter), [id number] Subsequent splinting/medication, compression bandaging, Dry needling, coping strategies training, patient/family education, and DME and/or AE instructions  RECOMMENDED OTHER SERVICES: none now  CONSULTED AND AGREED WITH PLAN OF CARE: Patient and family member/caregiver  PLAN FOR NEXT SESSION:   Check orthosis and initial recommendations, if less tender upgrade to light stretches and activities.   Melvenia Ada, OTR/L, CHT 07/14/2023,  12:43 PM

## 2023-07-14 ENCOUNTER — Ambulatory Visit: Payer: Self-pay | Admitting: Rehabilitative and Restorative Service Providers"

## 2023-07-14 ENCOUNTER — Encounter: Payer: Self-pay | Admitting: Rehabilitative and Restorative Service Providers"

## 2023-07-14 DIAGNOSIS — R600 Localized edema: Secondary | ICD-10-CM

## 2023-07-14 DIAGNOSIS — M25532 Pain in left wrist: Secondary | ICD-10-CM

## 2023-07-14 DIAGNOSIS — M6281 Muscle weakness (generalized): Secondary | ICD-10-CM

## 2023-07-14 DIAGNOSIS — M25632 Stiffness of left wrist, not elsewhere classified: Secondary | ICD-10-CM

## 2023-07-27 ENCOUNTER — Ambulatory Visit: Payer: No Typology Code available for payment source | Admitting: Physician Assistant

## 2023-07-28 NOTE — Therapy (Signed)
OUTPATIENT OCCUPATIONAL THERAPY TREATMENT NOTE  Patient Name: Claudia Hernandez MRN: [id number] DOB:1951/03/14, 73 y.o., female Today's Date: 07/29/2023  PCP: Wilma Flavin, MD REFERRING PROVIDER:  Samuella Cota, MD    END OF SESSION:  OT End of Session - 07/29/23 4098     Visit Number 2    Number of Visits 8    Date for OT Re-Evaluation 08/26/23    Authorization Type N/A?    OT Start Time 0806    OT Stop Time 0844    OT Time Calculation (min) 38 min    Equipment Utilized During Treatment orthotic materials    Activity Tolerance Patient tolerated treatment well;No increased pain;Patient limited by pain    Behavior During Therapy [facility] for tasks assessed/performed              Past Medical History:  Diagnosis Date   Arthritis    Asthma    hx of   Chronic diffuse otitis externa of both ears 08/04/2021   Heart murmur    unsure of this, but was told she had one by MD in [place]   Hypertension    Past Surgical History:  Procedure Laterality Date   BLADDER SURGERY     CESAREAN SECTION     CHOLECYSTECTOMY     gallbladder removed     left foot surgery     Patient Active Problem List   Diagnosis Date Noted   Vitamin B 12 deficiency 04/14/2023   Wrist pain, acute, left 07/22/2022   Statin intolerance 02/19/2022   Arthritis of finger of left hand 02/18/2022   Elevated liver enzymes 02/18/2022   SND (sensory-neural deafness), asymmetrical 02/18/2022   Bilateral knee pain 03/24/2021   Elevated LDL cholesterol level 06/17/2020   Bilateral hearing loss due to cerumen impaction 06/16/2020   Toe pain, right 05/13/2020   Essential hypertension 05/13/2020    ONSET DATE: chronic   REFERRING DIAG: M25.532 (ICD-10-CM) - Pain in left wrist   THERAPY DIAG:  Pain in left wrist  Localized edema  Muscle weakness (generalized)  Stiffness of left wrist, not elsewhere classified  Rationale for Evaluation and Treatment: Rehabilitation  PERTINENT HISTORY: Per  MD note: "ongoing left ulnar-sided wrist pain that has been present now for roughly 2 years. She states that she does have a remote history of an injury to the wrist as a child which went with nonoperative care. She was seen recently by my partner Dr. Roda Shutters, who referred her to me for ongoing ulnar abutment syndrome.Marland KitchenMarland KitchenX-rays do show significant ulnar positivity and abutment at the left wrist in comparison to the right side. There is also notable degeneration at the DRUJ interval of the left wrist. 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 "  She states chronic Lt wrist pain that primarily began ~ 1 year ago.   She states primary activity of pain is washing dishes, and she does not use her dishwasher.    PRECAUTIONS: None  RED FLAGS: None   WEIGHT BEARING RESTRICTIONS: Yes she was recommended to not lift anything heavy for several weeks, likely less than 10 pounds   SUBJECTIVE:   SUBJECTIVE STATEMENT: She arrives a bit late with interpretor today and a younger family member both who speak with her and help explain.  She states unsure if she's doing better or not- she has been wearing orthosis ~70% of the time at home.      PAIN:  Are you having pain? Yes:  NPRS scale: 0/10 at rest now, still painful during the week  Pain location: Left wrist ulnarly Pain description: Aching and sore Aggravating factors: Motion and weightbearing Relieving factors: Rest and heat  PATIENT GOALS: To improve pain in the left wrist  NEXT MD VISIT: As needed   OBJECTIVE: (All objective assessments below are from initial evaluation on: 07/14/23 unless otherwise specified.)    HAND DOMINANCE: Right   ADLs: Overall ADLs: States decreased ability to grab, hold household objects, pain and difficulty to open containers; she states washing dishes is especially painful and problematic activity for her   UPPER EXTREMITY ROM     Shoulder to Wrist AROM Right eval  Left eval Lt 07/29/23  Wrist flexion 55 43 36  Wrist extension 75 46 54  (Blank rows = not tested)   Hand AROM Right eval Left eval  Full Fist Ability (or Gap to Distal Palmar Crease) Full Full but tender  Thumb Opposition  (Kapandji Scale)  [facility] WFL  (Blank rows = not tested)   UPPER EXTREMITY MMT:    Eval:  NT at eval due to high pain and tenderness on palpation at the left wrist. Will be tested when appropriate.  No significant deficits noted in the right side  MMT Left TBD  Elbow flexion   Elbow extension   Forearm supination   Forearm pronation   Wrist flexion   Wrist extension   Wrist ulnar deviation   Wrist radial deviation   (Blank rows = not tested)  HAND FUNCTION: Eval: Observed weakness in affected left hand.  Details TBD Grip strength Right: TBD lbs, Left: TBD lbs   COORDINATION: Eval: Observed coordination impairments with affected left hand and arm, details TBD as needed Box and Blocks Test: TBD Blocks today (TBD is [facility])  EDEMA:   07/29/23: 17.4cm in Lt wrist compared to 16.9cm Rt wrist    Eval: Moderately swollen in left ulnar side of wrist   OBSERVATIONS:   Eval: X-ray images seem to show chronic comminuted fracture of the ulnar styloid and ulnar head, with positive ulnar variance.  Some obvious arthritis in there as well.  She was extremely tender to touch today with palpable tendinitis directly over her ulnar wrist area, and is likely that ECU and FCU are irritated by the deformed ulnar bone structure.   TODAY'S TREATMENT:  07/29/23: She appears less tender to palpation now, has improved wrist extension but a bit tighter wrist flexion.  OT educates on modalities like heat and ice to help with pain and swelling, while she is on heat for 5 minutes.  She states that heat feels pain relieving.  OT then attempts several new stretches with her, but she only tolerates radial deviation stretch away from the painful ulnar side.  OT does this with her several  times to ensure compliance and understanding and nonpainful stretch technique.   Then, OT reviews use of her orthosis to rest is much as possible, and makes necessary adjustments to it to make it more comfortable or her.    Exercises - Seated Wrist Radial Deviation Stretch  - 4 x daily - 3 reps - 15 hold   PATIENT EDUCATION: Education details: See tx section above for details  Person educated: Patient Education method: Verbal Instruction, Teach back, Handouts  Education comprehension: States and demonstrates understanding, Additional Education required    HOME EXERCISE PROGRAM: Access Code: [email or web address] URL: [email or web address] Date: 07/29/2023 Prepared by: Fannie Knee   GOALS: Goals reviewed with patient? Yes  SHORT TERM GOALS: (STG required if POC>30 days) Target Date: 07/22/2023  Pt will obtain protective, custom orthotic. Goal status: 07/14/2023: MET   2.  Pt will demo/state understanding of initial HEP to improve pain levels and prerequisite motion. Goal status: 07/29/23: MET   LONG TERM GOALS: Target Date: 08/26/2023  Pt will improve grip strength in left hand from TBD lbs to at least TBD lbs for functional use at home and in IADLs. Goal status: INITIAL-TBD baseline measure next session  2.  Pt will improve A/ROM in left wrist flexion/extension by at least 10 degrees each, to have functional motion for tasks like reach and grasp.  Goal status: INITIAL  3.  Pt will improve strength in left wrist flexion/extension from apparent 3 -/5 MMT to at least 4/5 MMT to have increased functional ability to carry out selfcare and higher-level homecare tasks with less difficulty. Goal status: INITIAL  4.  Pt will improve coordination skills in left hand and arm, as seen by within functional limit score on box and blocks testing to have increased functional ability to carry out fine motor tasks (fasteners, etc.) and more complex, coordinated IADLs (meal prep,  sports, etc.).  Goal status: INITIAL  5.  Pt will decrease pain at worst from 6-7/10 to 2/10 or better to have better sleep and occupational participation in daily roles. Goal status: INITIAL   ASSESSMENT:  CLINICAL IMPRESSION: 07/29/23: Perhaps due to age or memory concerns or not native [place] speaker, she has some difficulty understanding the concept of resting her sore arm while it is inflamed and painful and tender.  OT discusses this with her for self-care today and she seems to understand better.  She only tolerated 1 gentle stretch today without pain so this was encouraged.  The main idea is to rest allow healing, and get into gentle stretches to soothe painful, arthritic, inflamed joints and tendons.    PLAN:  OT FREQUENCY: 1-2x/week  OT DURATION: 6 weeks through 08/26/2023 as needed and up to 8 total visits  PLANNED INTERVENTIONS: 97168 OT Re-evaluation, 97535 self care/ADL training, 84696 therapeutic exercise, 97530 therapeutic activity, 97112 neuromuscular re-education, 97140 manual therapy, 97039 fluidotherapy, 97010 moist heat, 97010 cryotherapy, 97760 Orthotics management and training, 29528 Splinting (initial encounter), [phone] Subsequent splinting/medication, compression bandaging, Dry needling, coping strategies training, patient/family education, and DME and/or AE instructions  CONSULTED AND AGREED WITH PLAN OF CARE: Patient and family member/caregiver  PLAN FOR NEXT SESSION:   Follow-up in a week to determine whether she has been able to rest, if she is less painful and tender and tolerating a radial deviation stretch better.  We will try to progress to flexion and extension stretches and perhaps wean into a compression strap versus rigid orthosis as tolerated.  Fannie Knee, OTR/L, CHT 07/29/2023, 10:20 AM

## 2023-07-29 ENCOUNTER — Ambulatory Visit: Payer: No Typology Code available for payment source | Admitting: Rehabilitative and Restorative Service Providers"

## 2023-07-29 ENCOUNTER — Encounter: Payer: Self-pay | Admitting: Rehabilitative and Restorative Service Providers"

## 2023-07-29 DIAGNOSIS — M6281 Muscle weakness (generalized): Secondary | ICD-10-CM

## 2023-07-29 DIAGNOSIS — M25532 Pain in left wrist: Secondary | ICD-10-CM

## 2023-07-29 DIAGNOSIS — M25632 Stiffness of left wrist, not elsewhere classified: Secondary | ICD-10-CM

## 2023-07-29 DIAGNOSIS — R600 Localized edema: Secondary | ICD-10-CM

## 2023-08-03 NOTE — Therapy (Signed)
 OUTPATIENT OCCUPATIONAL THERAPY TREATMENT NOTE  Patient Name: Claudia Hernandez MRN: [id number] DOB:[birth year], 73 y.o., female Today's Date: 08/03/2023  PCP: Wilma Flavin, MD REFERRING PROVIDER:  Samuella Cota, MD    END OF SESSION:     Past Medical History:  Diagnosis Date   Arthritis    Asthma    hx of   Chronic diffuse otitis externa of both ears 08/04/2021   Heart murmur    unsure of this, but was told she had one by MD in [place]   Hypertension    Past Surgical History:  Procedure Laterality Date   BLADDER SURGERY     CESAREAN SECTION     CHOLECYSTECTOMY     gallbladder removed     left foot surgery     Patient Active Problem List   Diagnosis Date Noted   Vitamin B 12 deficiency 04/14/2023   Wrist pain, acute, left 07/22/2022   Statin intolerance 02/19/2022   Arthritis of finger of left hand 02/18/2022   Elevated liver enzymes 02/18/2022   SND (sensory-neural deafness), asymmetrical 02/18/2022   Bilateral knee pain 03/24/2021   Elevated LDL cholesterol level 06/17/2020   Bilateral hearing loss due to cerumen impaction 06/16/2020   Toe pain, right 05/13/2020   Essential hypertension 05/13/2020    ONSET DATE: chronic   REFERRING DIAG: M25.532 (ICD-10-CM) - Pain in left wrist   THERAPY DIAG:  No diagnosis found.  Rationale for Evaluation and Treatment: Rehabilitation  PERTINENT HISTORY: Per MD note: "ongoing left ulnar-sided wrist pain that has been present now for roughly 2 years. She states that she does have a remote history of an injury to the wrist as a child which went with nonoperative care. She was seen recently by my partner Dr. Roda Shutters, who referred her to me for ongoing ulnar abutment syndrome.Marland KitchenMarland KitchenX-rays do show significant ulnar positivity and abutment at the left wrist in comparison to the right side. There is also notable degeneration at the DRUJ interval of the left wrist. 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 "  She states chronic Lt wrist pain that primarily began ~ 1 year ago.   She states primary activity of pain is washing dishes, and she does not use her dishwasher.    PRECAUTIONS: None  RED FLAGS: None   WEIGHT BEARING RESTRICTIONS: Yes she was recommended to not lift anything heavy for several weeks, likely less than 10 pounds   SUBJECTIVE:   SUBJECTIVE STATEMENT: She arrives with interpretor today and a younger family member both who speak with her and help explain.  She states ***   unsure if she's doing better or not- she has been wearing orthosis ~70% of the time at home.      PAIN:  Are you having pain? Yes: NPRS scale: 0/10 at rest now, still painful during the week  Pain location: Left wrist ulnarly Pain description: Aching and sore Aggravating factors: Motion and weightbearing Relieving factors: Rest and heat  PATIENT GOALS: To improve pain in the left wrist  NEXT MD VISIT: As needed   OBJECTIVE: (All objective assessments below are from initial evaluation on: 07/14/23 unless otherwise specified.)    HAND DOMINANCE: Right   ADLs: Overall ADLs: States decreased ability to grab, hold household objects, pain and difficulty to open containers; she states washing dishes is especially painful and problematic activity for her   UPPER EXTREMITY ROM     Shoulder to Wrist AROM Right eval Left  eval Lt 07/29/23 Lt 08/04/23  Wrist flexion 55 43 36 ***  Wrist extension 75 46 54   (Blank rows = not tested)   Hand AROM Right eval Left eval  Full Fist Ability (or Gap to Distal Palmar Crease) Full Full but tender  Thumb Opposition  (Kapandji Scale)  [facility] WFL  (Blank rows = not tested)   UPPER EXTREMITY MMT:    Eval:  NT at eval due to high pain and tenderness on palpation at the left wrist. Will be tested when appropriate.  No significant deficits noted in the right side  MMT Left TBD  Elbow flexion   Elbow extension   Forearm  supination   Forearm pronation   Wrist flexion   Wrist extension   Wrist ulnar deviation   Wrist radial deviation   (Blank rows = not tested)  HAND FUNCTION: Eval: Observed weakness in affected left hand.  Details TBD Grip strength Right: TBD lbs, Left: TBD lbs   COORDINATION: Eval: Observed coordination impairments with affected left hand and arm, details TBD as needed Box and Blocks Test: TBD Blocks today (TBD is [facility])  EDEMA:   07/29/23: 17.4cm in Lt wrist compared to 16.9cm Rt wrist    Eval: Moderately swollen in left ulnar side of wrist   OBSERVATIONS:   Eval: X-ray images seem to show chronic comminuted fracture of the ulnar styloid and ulnar head, with positive ulnar variance.  Some obvious arthritis in there as well.  She was extremely tender to touch today with palpable tendinitis directly over her ulnar wrist area, and is likely that ECU and FCU are irritated by the deformed ulnar bone structure.   TODAY'S TREATMENT:  08/04/23: *** Follow-up in a week to determine whether she has been able to rest, if she is less painful and tender and tolerating a radial deviation stretch better.  We will try to progress to flexion and extension stretches and perhaps wean into a compression strap versus rigid orthosis as tolerated.   07/29/23: She appears less tender to palpation now, has improved wrist extension but a bit tighter wrist flexion.  OT educates on modalities like heat and ice to help with pain and swelling, while she is on heat for 5 minutes.  She states that heat feels pain relieving.  OT then attempts several new stretches with her, but she only tolerates radial deviation stretch away from the painful ulnar side.  OT does this with her several times to ensure compliance and understanding and nonpainful stretch technique.   Then, OT reviews use of her orthosis to rest is much as possible, and makes necessary adjustments to it to make it more comfortable or her.    Exercises -  Seated Wrist Radial Deviation Stretch  - 4 x daily - 3 reps - 15 hold   PATIENT EDUCATION: Education details: See tx section above for details  Person educated: Patient Education method: Verbal Instruction, Teach back, Handouts  Education comprehension: States and demonstrates understanding, Additional Education required    HOME EXERCISE PROGRAM: Access Code: [email or web address] URL: [email or web address] Date: 07/29/2023 Prepared by: Fannie Knee   GOALS: Goals reviewed with patient? Yes   SHORT TERM GOALS: (STG required if POC>30 days) Target Date: 07/22/2023  Pt will obtain protective, custom orthotic. Goal status: 07/14/2023: MET   2.  Pt will demo/state understanding of initial HEP to improve pain levels and prerequisite motion. Goal status: 07/29/23: MET   LONG TERM GOALS: Target Date: 08/26/2023  Pt will improve grip strength  in left hand from TBD lbs to at least TBD lbs for functional use at home and in IADLs. Goal status: INITIAL-TBD baseline measure next session  2.  Pt will improve A/ROM in left wrist flexion/extension by at least 10 degrees each, to have functional motion for tasks like reach and grasp.  Goal status: INITIAL  3.  Pt will improve strength in left wrist flexion/extension from apparent 3 -/5 MMT to at least 4/5 MMT to have increased functional ability to carry out selfcare and higher-level homecare tasks with less difficulty. Goal status: INITIAL  4.  Pt will improve coordination skills in left hand and arm, as seen by within functional limit score on box and blocks testing to have increased functional ability to carry out fine motor tasks (fasteners, etc.) and more complex, coordinated IADLs (meal prep, sports, etc.).  Goal status: INITIAL  5.  Pt will decrease pain at worst from 6-7/10 to 2/10 or better to have better sleep and occupational participation in daily roles. Goal status: INITIAL   ASSESSMENT:  CLINICAL IMPRESSION: 08/04/23:  ***  07/29/23: Perhaps due to age or memory concerns or not native [place] speaker, she has some difficulty understanding the concept of resting her sore arm while it is inflamed and painful and tender.  OT discusses this with her for self-care today and she seems to understand better.  She only tolerated 1 gentle stretch today without pain so this was encouraged.  The main idea is to rest allow healing, and get into gentle stretches to soothe painful, arthritic, inflamed joints and tendons.    PLAN:  OT FREQUENCY: 1-2x/week  OT DURATION: 6 weeks through 08/26/2023 as needed and up to 8 total visits  PLANNED INTERVENTIONS: 97168 OT Re-evaluation, 97535 self care/ADL training, 91478 therapeutic exercise, 97530 therapeutic activity, 97112 neuromuscular re-education, 97140 manual therapy, 97039 fluidotherapy, 97010 moist heat, 97010 cryotherapy, 97760 Orthotics management and training, 29562 Splinting (initial encounter), [phone] Subsequent splinting/medication, compression bandaging, Dry needling, coping strategies training, patient/family education, and DME and/or AE instructions  CONSULTED AND AGREED WITH PLAN OF CARE: Patient and family member/caregiver  PLAN FOR NEXT SESSION:   ***  Fannie Knee, OTR/L, CHT 08/03/2023, 8:05 AM

## 2023-08-04 ENCOUNTER — Encounter: Payer: Self-pay | Admitting: Rehabilitative and Restorative Service Providers"

## 2023-08-04 ENCOUNTER — Other Ambulatory Visit: Payer: Self-pay

## 2023-08-04 ENCOUNTER — Ambulatory Visit (INDEPENDENT_AMBULATORY_CARE_PROVIDER_SITE_OTHER): Payer: Self-pay | Admitting: Rehabilitative and Restorative Service Providers"

## 2023-08-04 DIAGNOSIS — R600 Localized edema: Secondary | ICD-10-CM

## 2023-08-04 DIAGNOSIS — M25632 Stiffness of left wrist, not elsewhere classified: Secondary | ICD-10-CM

## 2023-08-04 DIAGNOSIS — M25532 Pain in left wrist: Secondary | ICD-10-CM

## 2023-08-04 DIAGNOSIS — M6281 Muscle weakness (generalized): Secondary | ICD-10-CM

## 2023-08-09 ENCOUNTER — Telehealth: Payer: Self-pay

## 2023-08-09 NOTE — Telephone Encounter (Signed)
 Copied from CRM [id number]. Topic: Referral - Question >> Aug 09, 2023  9:08 AM Nyra Capes wrote: Reason for CRM: Vassie Loll from [employer] is asking about patients insurance and a possible referral for hearing aids.  Patient has appt 08/11/23  Vassie Loll would like to know if visit will be covered. Call back number for [facility] [phone]

## 2023-08-10 ENCOUNTER — Ambulatory Visit: Payer: Self-pay | Attending: Physician Assistant | Admitting: Physician Assistant

## 2023-08-10 ENCOUNTER — Other Ambulatory Visit: Payer: Self-pay

## 2023-08-10 VITALS — BP 115/71 | HR 75 | Ht 59.0 in | Wt 126.0 lb

## 2023-08-10 DIAGNOSIS — I10 Essential (primary) hypertension: Secondary | ICD-10-CM

## 2023-08-10 DIAGNOSIS — Z603 Acculturation difficulty: Secondary | ICD-10-CM

## 2023-08-10 DIAGNOSIS — M25532 Pain in left wrist: Secondary | ICD-10-CM

## 2023-08-10 DIAGNOSIS — E785 Hyperlipidemia, unspecified: Secondary | ICD-10-CM

## 2023-08-10 DIAGNOSIS — H1010 Acute atopic conjunctivitis, unspecified eye: Secondary | ICD-10-CM

## 2023-08-10 DIAGNOSIS — E538 Deficiency of other specified B group vitamins: Secondary | ICD-10-CM

## 2023-08-10 DIAGNOSIS — Z758 Other problems related to medical facilities and other health care: Secondary | ICD-10-CM

## 2023-08-10 DIAGNOSIS — M7661 Achilles tendinitis, right leg: Secondary | ICD-10-CM

## 2023-08-10 DIAGNOSIS — D7589 Other specified diseases of blood and blood-forming organs: Secondary | ICD-10-CM

## 2023-08-10 DIAGNOSIS — G250 Essential tremor: Secondary | ICD-10-CM

## 2023-08-10 MED ORDER — LOSARTAN POTASSIUM 100 MG PO TABS
100.0000 mg | ORAL_TABLET | Freq: Every day | ORAL | 1 refills | Status: DC
  Filled 2023-08-10 – 2023-10-11 (×2): qty 90, 90d supply, fill #0

## 2023-08-10 MED ORDER — MELOXICAM 7.5 MG PO TABS
7.5000 mg | ORAL_TABLET | Freq: Every day | ORAL | 1 refills | Status: DC
  Filled 2023-08-10 – 2023-10-11 (×2): qty 90, 90d supply, fill #0
  Filled 2024-01-03: qty 90, 90d supply, fill #1

## 2023-08-10 MED ORDER — OLOPATADINE HCL 0.1 % OP SOLN
1.0000 [drp] | Freq: Two times a day (BID) | OPHTHALMIC | 3 refills | Status: DC
  Filled 2023-08-10: qty 5, 25d supply, fill #0

## 2023-08-10 NOTE — Telephone Encounter (Signed)
 Patient came for an in-person appointment and spoke with the patient's daughter who stated that they are aware that the hearing aid will be an out of pocket expense. They are waiting to be contacted for an estimate. Additionally, they also have an appointment with a different company for an evaluation and estimate named [employer] (ph #: [phone]) to decide which offer is best.   Called to further clarify & spoke to [place]ano. She stated that due to the patient not having insurance the only other option is for her to to pay out of pocket. She will call the patient to see if they would like to move forward. The approximate minimum amount is $1,900. Vassie Loll stated that she will contact the patient to inform her that the deparment of Human services can sometimes officer 1 free hearing aid. Will await for Alva to contact patient. Currently no referral is needed.

## 2023-08-10 NOTE — Patient Instructions (Addendum)
 Drink 64 to 80 ounces water today  Temblor esencial Essential Tremor Un temblor es un estremecimiento o una sacudida que una persona no Magazine features editor. La [employer] de los temblores afectan las manos o los brazos. Tambin pueden afectar la cabeza, las cuerdas vocales, las piernas y otras partes del cuerpo. El temblor esencial es un temblor sin causa aparente. Por lo general, se produce mientras una persona est tratando de llevar a cabo una accin. Suele empeorar de forma gradual a medida que una persona envejece. Cules son las causas? La causa de esta afeccin generalmente se desconoce, pero suele ser hereditaria. Qu incrementa el riesgo? Es ms probable que sufra esta afeccin si: Tiene un familiar con temblor esencial. Es mayor de 40 aos. Cules son los signos o sntomas? El signo principal de un temblor es una sacudida rtmica de determinadas partes del cuerpo que es incontrolable e involuntaria. Puede: Tener dificultad para comer con una cuchara o un tenedor. Tener dificultad para escribir. Mover la cabeza de [place] abajo o de lado a lado. Tener la voz entrecortada. Los temblores pueden: Empeorar con [employer]. Aparecer y Geneticist, molecular. Ser ms notorios de un lado del cuerpo. Empeorar a causa de la tensin nerviosa, el cansancio (fatiga), la cafena y el calor o el fro extremos. Cmo se diagnostica? Esta afeccin se puede diagnosticar en funcin de lo siguiente: Los sntomas y los antecedentes mdicos. Un examen fsico. No existe ningn estudio para diagnosticar el temblor esencial. No obstante, el mdico podr solicitar una variedad de pruebas para descartar otras causas de su afeccin. Pueden incluir: Anlisis de [place] y [place]. Estudios de diagnstico por imgenes del cerebro, como una tomografa computarizada (TC) o una resonancia magntica (RM). Cmo se trata? El tratamiento para el temblor esencial depende de la gravedad de la enfermedad. Es posible que los temblores  leves no necesiten tratamiento si no afectan la vida diaria. Los temblores graves pueden necesitar tratarse utilizando una o ms de las siguientes opciones: Medicamentos. Inyecciones de una sustancia llamada toxina botulnica. Procedimientos como el implante de estimulacin cerebral profunda (ECP) o el tratamiento ecogrfico guiado por RM. Cambios en el estilo de vida. Terapia ocupacional o fisioterapia. Siga estas instrucciones en su casa: Estilo de vida  No consuma ningn producto que contenga nicotina o tabaco. Estos productos incluyen cigarrillos, tabaco para Theatre manager y aparatos de vapeo, como los Administrator, Civil Service. Si necesita ayuda para dejar de fumar, consulte al mdico. Limite el consumo de cafena, segn se lo haya indicado el mdico. Trate de dormir 8 horas todas las noches. Busque maneras de Charity fundraiser que se adapten a su estilo de vida y personalidad. Considere la posibilidad de probar con meditacin o yoga. Trate de anticipar situaciones estresantes y permita un tiempo adicional para controlarlas. Si los efectos de los temblores lo estn [employer], considere la posibilidad de Printmaker con un profesional de la salud mental. Instrucciones generales Use los medicamentos de venta libre y los recetados solamente como se lo haya indicado el mdico. Evite el calor y el fro extremos. Concurra a todas las visitas de seguimiento. Esto es importante. Las visitas pueden incluir visitas de fisioterapia. Dnde obtener ms informacin [employer] of Neurological Disorders and Stroke Lawyer de Pharmacist, community Neurolgicos y Accidentes Cerebrovasculares): [email or web address] Comunquese con un mdico si: [facility] doctor o la intensidad de los temblores. Empieza a temblar despus de comenzar un medicamento nuevo. Tiene temblores junto con otros sntomas, tales como: Entumecimiento. Hormigueo. Dolor. Debilidad. Los temblores  [employer]. El  temblor interfiere con la vida diaria. Se siente decado, afligido o triste durante al menos 2 semanas consecutivas. Su preocupacin por Development worker, international aid y por lo que otras personas piensen de usted interfiere con sus funciones de la vida diaria, incluidas las [place], el trabajo o la escuela. Resumen El temblor esencial es un temblor sin causa aparente. Por lo general, se produce al tratar de llevar a cabo una accin. Tiene ms probabilidades de presentar esta afeccin si tiene un familiar con temblor esencial. El signo principal de un temblor es una sacudida rtmica de determinadas partes del cuerpo que es incontrolable e involuntaria. El tratamiento para el temblor esencial depende de la gravedad de la enfermedad. Esta informacin no tiene Theme park manager el consejo del mdico. Asegrese de hacerle al mdico cualquier pregunta que tenga. Document Revised: 04/02/2021 Document Reviewed: 04/02/2021 Elsevier Patient Education  2024 [employer].

## 2023-08-10 NOTE — Telephone Encounter (Signed)
 Referral to be sent to [facility] or to ENT?

## 2023-08-10 NOTE — Progress Notes (Signed)
 Patient ID: Claudia Hernandez, female   DOB: October 02, 1950, 73 y.o.   MRN: [id number]   Claudia Hernandez, is a 73 y.o. female  AOZ:[id number]  NGE:[id number]  DOB - 1951/02/14  Chief Complaint  Patient presents with   Tremors    Tremors on R hand & worsening  Requesting labs for lipid panel, B 12 & A1C  Concern about unstable weight gain / loss - discuss possible supplement  Requesting rx for olopatadine eye drops       Subjective:   Claudia Hernandez is a 73 y.o. female here today for tremor she has had her whole life in R hand previously worked up by neuro about 10 years ago.  She had nerve conduction studies.  She says they told her "nothing was wrong."  Her dad had this too.  It worsens with stress and temperature extremes.  She has noticed it even more the last few years.  She had low b12-came in for 3 b12 injections then took by mouth for about 1 month.  Wants to be cheked for other vitamin deficiencies  We talked about weight concerns at last visit-but in Epic she has been bt 126-129 for the past 1.5 years.  TSH was normal.    No problems updated.  ALLERGIES: Allergies  Allergen Reactions   Statins Other (See Comments)    ELEVATED LFTs    PAST MEDICAL HISTORY: Past Medical History:  Diagnosis Date   Arthritis    Asthma    hx of   Chronic diffuse otitis externa of both ears 08/04/2021   Heart murmur    unsure of this, but was told she had one by MD in [place]   Hypertension     MEDICATIONS AT HOME: Prior to Admission medications   Medication Sig Start Date End Date Taking? Authorizing Provider  ACETAMINOPHEN PO Take by mouth. PRN   Yes [provider]  COLLAGEN PO Take by mouth.   Yes [provider]  ezetimibe (ZETIA) 10 MG tablet Take 1 tablet (10 mg total) by mouth daily. 07/21/22  Yes Storm Frisk, MD  hydrocortisone cream 0.5 % Apply 1 Application topically 2 (two) times daily. 09/21/22  Yes Raspet, Noberto Retort, PA-C  losartan  (COZAAR) 100 MG tablet Take 1 tablet by mouth once daily 05/17/23  Yes Marcine Matar, MD  predniSONE (DELTASONE) 10 MG tablet Take 1 tablet (10 mg total) by mouth daily with breakfast. 05/26/23  Yes Kristyanna Barcelo M, PA-C  carbamide peroxide (DEBROX) 6.5 % OTIC solution Place 5 drops into both ears daily. Patient not taking: Reported on 08/10/2023 04/11/23   Marcine Matar, MD  losartan (COZAAR) 100 MG tablet Take 1 tablet (100 mg total) by mouth daily. 08/10/23   Anders Simmonds, PA-C  meloxicam (MOBIC) 7.5 MG tablet Take 1 tablet (7.5 mg total) by mouth daily. 08/10/23   Anders Simmonds, PA-C  olopatadine (PATADAY) 0.1 % ophthalmic solution Place 1 drop into both eyes 2 (two) times daily. 08/10/23   Yaretzy Olazabal, Marzella Schlein, PA-C    ROS: Neg HEENT Neg resp Neg cardiac Neg GI Neg GU Neg MS Neg psych  Objective:   Vitals:   08/10/23 1622  BP: 115/71  Pulse: 75  SpO2: 97%  Weight: 126 lb (57.2 kg)  Height: 4\' 11"  (1.499 m)   Exam General appearance : Awake, alert, not in any distress. Speech Clear. Not toxic looking HEENT: Atraumatic and Normocephalic, pupils equally reactive to light and accomodation,  mucus membranes dry Neck: Supple, no JVD. No cervical lymphadenopathy.  Chest: Good air entry bilaterally, CTAB.  No rales/rhonchi/wheezing CVS: S1 S2 regular, no murmurs.  Extremities: B/L Lower Ext shows no edema, both legs are warm to touch Neurology: Awake alert, and oriented X 3, CN II-XII intact, Non focal.  Mild tremor R hand Skin: No Rash  Data Review Lab Results  Component Value Date   HGBA1C 5.2 08/04/2021    Assessment & Plan   1. Achilles tendinitis, right leg - meloxicam (MOBIC) 7.5 MG tablet; Take 1 tablet (7.5 mg total) by mouth daily.  Dispense: 90 tablet; Refill: 1  2. Wrist pain, left - meloxicam (MOBIC) 7.5 MG tablet; Take 1 tablet (7.5 mg total) by mouth daily.  Dispense: 90 tablet; Refill: 1  3. Benign essential tremor (Primary) Gave educational  information and rechk B12  4. Language barrier Daughter with her interpreting.  AMN declined  5. Vitamin B 12 deficiency - Vitamin D, 25-hydroxy; Future  6. Hyperlipidemia, unspecified hyperlipidemia type She will RTC for fasting labs - Lipid Panel; Future  7. Macrocytosis - B12 and Folate Panel; Future - Vitamin D, 25-hydroxy; Future - CBC with Differential; Future  8. Essential hypertension controlled - losartan (COZAAR) 100 MG tablet; Take 1 tablet (100 mg total) by mouth daily.  Dispense: 90 tablet; Refill: 1  9. Allergic conjunctivitis, unspecified laterality - olopatadine (PATADAY) 0.1 % ophthalmic solution; Place 1 drop into both eyes 2 (two) times daily.  Dispense: 5 mL; Refill: 3    Return in about 3 months (around 11/10/2023) for PCP for chronic conditions-[place].  The patient was given clear instructions to go to ER or return to medical center if symptoms don't improve, worsen or new problems develop. The patient verbalized understanding. The patient was told to call to get lab results if they haven't heard anything in the next week.      Georgian Co, PA-C [facility] [place], [place] [phone]   08/10/2023, 5:17 PM

## 2023-08-11 ENCOUNTER — Encounter: Payer: No Typology Code available for payment source | Admitting: Rehabilitative and Restorative Service Providers"

## 2023-08-12 ENCOUNTER — Ambulatory Visit: Payer: Self-pay | Admitting: Internal Medicine

## 2023-08-15 ENCOUNTER — Ambulatory Visit: Payer: Self-pay | Admitting: Family Medicine

## 2023-08-17 ENCOUNTER — Ambulatory Visit: Payer: Self-pay | Attending: Internal Medicine

## 2023-08-17 DIAGNOSIS — E785 Hyperlipidemia, unspecified: Secondary | ICD-10-CM

## 2023-08-17 DIAGNOSIS — D7589 Other specified diseases of blood and blood-forming organs: Secondary | ICD-10-CM

## 2023-08-17 DIAGNOSIS — E538 Deficiency of other specified B group vitamins: Secondary | ICD-10-CM

## 2023-08-18 ENCOUNTER — Other Ambulatory Visit: Payer: Self-pay | Admitting: Physician Assistant

## 2023-08-18 ENCOUNTER — Encounter: Payer: No Typology Code available for payment source | Admitting: Rehabilitative and Restorative Service Providers"

## 2023-08-18 ENCOUNTER — Encounter: Payer: Self-pay | Admitting: Physician Assistant

## 2023-08-18 DIAGNOSIS — E559 Vitamin D deficiency, unspecified: Secondary | ICD-10-CM

## 2023-08-18 DIAGNOSIS — E538 Deficiency of other specified B group vitamins: Secondary | ICD-10-CM

## 2023-08-18 LAB — CBC WITH DIFFERENTIAL/PLATELET
Basophils Absolute: 0.1 10*3/uL (ref 0.0–0.2)
Basos: 1 %
EOS (ABSOLUTE): 1.1 10*3/uL — ABNORMAL HIGH (ref 0.0–0.4)
Eos: 10 %
Hematocrit: 43.1 % (ref 34.0–46.6)
Hemoglobin: 14 g/dL (ref 11.1–15.9)
Immature Grans (Abs): 0 10*3/uL (ref 0.0–0.1)
Immature Granulocytes: 0 %
Lymphocytes Absolute: 1.7 10*3/uL (ref 0.7–3.1)
Lymphs: 16 %
MCH: 30.5 pg (ref 26.6–33.0)
MCHC: 32.5 g/dL (ref 31.5–35.7)
MCV: 94 fL (ref 79–97)
Monocytes Absolute: 0.6 10*3/uL (ref 0.1–0.9)
Monocytes: 5 %
Neutrophils Absolute: 7.4 10*3/uL — ABNORMAL HIGH (ref 1.4–7.0)
Neutrophils: 68 %
Platelets: 273 10*3/uL (ref 150–450)
RBC: 4.59 x10E6/uL (ref 3.77–5.28)
RDW: 12 % (ref 11.7–15.4)
WBC: 10.9 10*3/uL — ABNORMAL HIGH (ref 3.4–10.8)

## 2023-08-18 LAB — LIPID PANEL
Chol/HDL Ratio: 3.5 ratio (ref 0.0–4.4)
Cholesterol, Total: 183 mg/dL (ref 100–199)
HDL: 53 mg/dL (ref >39)
LDL Chol Calc (NIH): 111 mg/dL — ABNORMAL HIGH (ref 0–99)
Triglycerides: 105 mg/dL (ref 0–149)
VLDL Cholesterol Cal: 19 mg/dL (ref 5–40)

## 2023-08-18 LAB — B12 AND FOLATE PANEL
Folate: >20 ng/mL (ref >3.0)
Vitamin B-12: 234 pg/mL (ref 232–1245)

## 2023-08-18 LAB — VITAMIN D 25 HYDROXY (VIT D DEFICIENCY, FRACTURES): Vit D, 25-Hydroxy: 27.9 ng/mL — ABNORMAL LOW (ref 30.0–100.0)

## 2023-08-18 MED ORDER — VITAMIN B-12 1000 MCG PO TABS
1000.0000 ug | ORAL_TABLET | Freq: Every day | ORAL | 1 refills | Status: DC
  Filled 2023-08-18: qty 130, 130d supply, fill #0

## 2023-08-18 MED ORDER — VITAMIN D3 50 MCG (2000 UT) PO CAPS
2000.0000 [IU] | ORAL_CAPSULE | Freq: Every day | ORAL | 1 refills | Status: DC
  Filled 2023-08-18: qty 100, 100d supply, fill #0

## 2023-08-19 ENCOUNTER — Other Ambulatory Visit: Payer: Self-pay

## 2023-08-30 ENCOUNTER — Other Ambulatory Visit: Payer: Self-pay

## 2023-08-31 ENCOUNTER — Ambulatory Visit: Payer: Self-pay

## 2023-08-31 ENCOUNTER — Ambulatory Visit (HOSPITAL_COMMUNITY)
Admission: EM | Admit: 2023-08-31 | Discharge: 2023-08-31 | Disposition: A | Discharge status: Home / Self Care | Payer: Self-pay | Attending: Emergency Medicine | Admitting: Emergency Medicine

## 2023-08-31 ENCOUNTER — Other Ambulatory Visit: Payer: Self-pay

## 2023-08-31 ENCOUNTER — Encounter (HOSPITAL_COMMUNITY): Payer: Self-pay | Admitting: *Deleted

## 2023-08-31 DIAGNOSIS — N3001 Acute cystitis with hematuria: Secondary | ICD-10-CM

## 2023-08-31 LAB — POCT URINALYSIS DIP (MANUAL ENTRY)
Bilirubin, UA: NEGATIVE
Glucose, UA: NEGATIVE mg/dL
Ketones, POC UA: NEGATIVE mg/dL
Leukocytes, UA: NEGATIVE
Nitrite, UA: POSITIVE — AB
Protein Ur, POC: NEGATIVE mg/dL
Spec Grav, UA: <=1.005 — AB (ref 1.010–1.025)
Urobilinogen, UA: 0.2 U/dL
pH, UA: 5.5 (ref 5.0–8.0)

## 2023-08-31 MED ORDER — SULFAMETHOXAZOLE-TRIMETHOPRIM 800-160 MG PO TABS: 1.0000 | ORAL_TABLET | Freq: Two times a day (BID) | ORAL | 0 refills | Status: AC

## 2023-08-31 MED ORDER — PHENAZOPYRIDINE HCL 200 MG PO TABS: 200.0000 mg | ORAL_TABLET | Freq: Three times a day (TID) | ORAL | 0 refills | Status: DC

## 2023-08-31 NOTE — Telephone Encounter (Signed)
 Third attempt to reach pt. With Spanish interpreter [place] # [phone]. Message left to call back about symptoms.

## 2023-08-31 NOTE — Telephone Encounter (Signed)
  Chief Complaint: urinary symptoms Symptoms: pain with urination,  Frequency: 2 weeks, worsening Pertinent Negatives: Patient denies  Disposition: [] ED /[] Urgent Care (no appt availability in office) / [] Appointment(In office/virtual)/ []  [facility]/ [] Home Care/ [] Refused Recommended Disposition /[] [place] [facility]/ []  Follow-up with PCP Additional Notes: Patient reports via Interpreter [phone] that she has a history of cystitis and is experiencing urinary pain over the past 2 weeks that is getting worse. Patient reports she did take OTC medication for this with no relief. Call disconnected before this RN was able to complete triage/give care advice. Attempted to call back with Interpreter. Received VMB, LVM for patient to return call. Will continue to attempt.    Copied from CRM [id number]. Topic: Clinical - Red Word Triage >> Aug 31, 2023 10:08 AM Ivette P wrote: [facility] that prompted transfer to Nurse Triage: PT is experiencing pain and wants to know if should go to urgent care. Pt said she has Cystitis Answer Assessment - Initial Assessment Questions 1. SYMPTOM: "What's the main symptom you're concerned about?" (e.g., frequency, incontinence)     Pain with urination 2. ONSET: "When did the  pain  start?"     2 weeks 3. PAIN: "Is there any pain?" If Yes, ask: "How bad is it?" (Scale: 1-10; mild, moderate, severe)     moderate 4. CAUSE: "What do you think is causing the symptoms?"     Hx of cystitis 5. OTHER SYMPTOMS: "Do you have any other symptoms?" (e.g., blood in urine, fever, flank pain, pain with urination)  Protocols used: Urinary Symptoms-A-AH

## 2023-08-31 NOTE — Telephone Encounter (Signed)
 Pt and interpreter Educardo#[id number] online with patient: verified pt information and then interpreter no longer on line therefore RN was not able to communicate with pt.

## 2023-08-31 NOTE — ED Triage Notes (Signed)
 PT reports dysuria for past week. Pain is 8/10.

## 2023-08-31 NOTE — ED Provider Notes (Signed)
 [facility]    CSN: [id number] Arrival date & time: 08/31/23  1717      History   Chief Complaint No chief complaint on file.   HPI Claudia Hernandez is a 73 y.o. female.   Per Spanish interpreter patient presents with dysuria and bladder pressure x 12 days.  Denies hematuria, flank pain, fever, nausea, vomiting, and abnormal vaginal discharge.  Patient states she has been taking Azo with minimal relief.     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      Home Medications    Prior to Admission medications   Medication Sig Start Date End Date Taking? Authorizing Provider  phenazopyridine (PYRIDIUM) 200 MG tablet Take 1 tablet (200 mg total) by mouth 3 (three) times daily. 08/31/23  Yes Susann Givens, Alianny Toelle A, NP  sulfamethoxazole-trimethoprim (BACTRIM DS) 800-160 MG tablet Take 1 tablet by mouth 2 (two) times daily for 5 days. 08/31/23 09/05/23 Yes Wynonia Lawman A, NP  ACETAMINOPHEN PO Take by mouth. PRN    [provider]  carbamide peroxide (DEBROX) 6.5 %  OTIC solution Place 5 drops into both ears daily. Patient not taking: Reported on 08/10/2023 04/11/23   Marcine Matar, MD  Cholecalciferol (VITAMIN D3) 50 MCG (2000 UT) capsule Take 1 capsule (2,000 Units total) by mouth daily. 08/18/23   Anders Simmonds, PA-C  COLLAGEN PO Take by mouth.    [provider]  cyanocobalamin (VITAMIN B12) 1000 MCG tablet Take 1 tablet (1,000 mcg total) by mouth daily. 08/18/23   Anders Simmonds, PA-C  ezetimibe (ZETIA) 10 MG tablet Take 1 tablet (10 mg total) by mouth daily. 07/21/22   Storm Frisk, MD  hydrocortisone cream 0.5 % Apply 1 Application topically 2 (two) times daily. 09/21/22   Raspet, Noberto Retort, PA-C  losartan (COZAAR) 100 MG tablet Take 1 tablet by mouth once daily 05/17/23   Marcine Matar, MD  losartan (COZAAR) 100 MG tablet Take 1 tablet (100 mg total) by mouth daily. 08/10/23   Anders Simmonds, PA-C  meloxicam (MOBIC) 7.5 MG tablet Take 1 tablet (7.5 mg total) by mouth daily. 08/10/23   Anders Simmonds, PA-C  olopatadine (PATADAY) 0.1 % ophthalmic solution Place 1 drop into both eyes 2 (two) times daily. 08/10/23   Anders Simmonds, PA-C  predniSONE (DELTASONE) 10 MG tablet Take 1 tablet (10 mg total) by mouth daily with breakfast.  05/26/23   Anders Simmonds, PA-C    Family History Family History  Problem Relation Age of Onset   Drug abuse Neg Hx    Hypertension Neg Hx    Colon cancer Neg Hx    Esophageal cancer Neg Hx    Rectal cancer Neg Hx    Stomach cancer Neg Hx    Breast cancer Neg Hx     Social History Social History   Tobacco Use   Smoking status: Never   Smokeless tobacco: Never  Vaping Use   Vaping status: Never Used  Substance Use Topics   Alcohol use: Not Currently   Drug use: Never     Allergies   Statins   Review of Systems Review of Systems  Per HPI  Physical Exam Triage Vital Signs ED Triage Vitals  Encounter Vitals Group     BP 08/31/23 1813 127/68     Systolic BP Percentile --       Diastolic BP Percentile --      Pulse Rate 08/31/23 1813 68     Resp 08/31/23 1813 16     Temp 08/31/23 1813 98.2 F (36.8 C)     Temp Source 08/31/23 1813 Oral     SpO2 08/31/23 1813 96 %     Weight --      Height --      Head Circumference --      Peak Flow --      Pain Score 08/31/23 1814 8     Pain Loc --      Pain Education --      Exclude from Growth Chart --    No data found.  Updated Vital Signs BP 127/68 (BP Location: Left Arm)   Pulse 68   Temp 98.2 F (36.8 C) (Oral)   Resp 16   SpO2 96%   Visual Acuity Right Eye Distance:   Left Eye Distance:   Bilateral Distance:    Right Eye Near:   Left Eye Near:    Bilateral Near:     Physical Exam Vitals and nursing note reviewed.  Constitutional:      General: She is awake. She is not in acute distress.    Appearance: Normal appearance. She is well-developed and well-groomed. She is not ill-appearing.  Abdominal:     General: Abdomen is flat.     Palpations: Abdomen is soft.     Tenderness: There is abdominal tenderness in the suprapubic area. There is no right CVA tenderness or left CVA tenderness.  Skin:    General: Skin is warm and dry.  Neurological:     Mental Status: She is alert.  Psychiatric:        Behavior: Behavior is cooperative.      UC Treatments / Results  Labs (all labs ordered are listed, but only abnormal results are displayed) Labs Reviewed  POCT URINALYSIS DIP (MANUAL ENTRY) - Abnormal; Notable for the following components:      Result Value   Spec Grav, UA <=1.005 (*)    Blood, UA small (*)    Nitrite, UA Positive (*)    All other components within normal limits  URINE CULTURE    EKG   Radiology No results found.  Procedures Procedures (including critical care time)  Medications Ordered in UC Medications - No data to display  Initial Impression / Assessment and Plan / UC Course  I have reviewed the triage vital signs and the nursing notes.  Pertinent labs &  imaging results that were available during my care of the patient were reviewed by me and considered in my medical decision making (see chart for details).     Upon assessment mild suprapubic tenderness noted.  Without CVA tenderness.  No other significant findings upon exam.  Urinalysis revealed small amounts of blood and positive nitrates, will send culture.  Prescribed Bactrim for acute cystitis coverage.  Prescribed Pyridium as needed for urinary pain.  Discussed follow-up and return precautions. Final Clinical Impressions(s) / UC Diagnoses   Final diagnoses:  Acute cystitis with hematuria     Discharge Instructions      Start taking Bactrim twice daily for 5 days for bladder infection. I have prescribed Pyridium that you can take 3 times daily as needed for urinary pain. Your urine culture will return over the next few days and someone will call if results require adjustment to your treatment plan. [facility] [address] so that you are able to pick up your prescriptions tonight as your other pharmacy is already closed. Return here if your symptoms persist or worsen.  Empiece a tomar Bactrim dos veces al da durante 5 das para la infeccin de vejiga. Le he recetado Pyridium, que puede tomar 3 veces al da segn sea necesario para el dolor urinario. Los resultados de su urocultivo estarn disponibles en los prximos das y le llamaremos si es necesario ajustar su plan de tratamiento. He enviado su receta a [facility] en [address] para que pueda recogerla esta noche, ya que su otra farmacia ya est cerrada. Vuelva aqu si los sntomas persisten o empeoran.    ED Prescriptions     Medication Sig Dispense Auth. Provider   sulfamethoxazole-trimethoprim (BACTRIM DS) 800-160 MG tablet Take 1 tablet by mouth 2 (two) times daily for 5 days. 10 tablet Wynonia Lawman A, NP   phenazopyridine (PYRIDIUM) 200 MG tablet Take 1  tablet (200 mg total) by mouth 3 (three) times daily. 6 tablet Wynonia Lawman A, NP      PDMP not reviewed this encounter.   Wynonia Lawman A, NP 08/31/23 [phone]

## 2023-08-31 NOTE — Discharge Instructions (Addendum)
 Start taking Bactrim twice daily for 5 days for bladder infection. I have prescribed Pyridium that you can take 3 times daily as needed for urinary pain. Your urine culture will return over the next few days and someone will call if results require adjustment to your treatment plan. [facility] [address] so that you are able to pick up your prescriptions tonight as your other pharmacy is already closed. Return here if your symptoms persist or worsen.  Empiece a tomar Bactrim dos veces al da durante 5 das para la infeccin de vejiga. Le he recetado Pyridium, que puede tomar 3 veces al da segn sea necesario para el dolor urinario. Los resultados de su urocultivo estarn disponibles en los prximos das y le llamaremos si es necesario ajustar su plan de tratamiento. He enviado su receta a [facility] [address] para que pueda recogerla esta noche, ya que su otra farmacia ya est cerrada. Vuelva aqu si los sntomas persisten o empeoran.

## 2023-09-16 ENCOUNTER — Encounter (HOSPITAL_COMMUNITY): Payer: Self-pay

## 2023-09-16 ENCOUNTER — Ambulatory Visit (HOSPITAL_COMMUNITY)
Admission: EM | Admit: 2023-09-16 | Discharge: 2023-09-16 | Disposition: A | Discharge status: Home / Self Care | Payer: Self-pay | Attending: Family Medicine | Admitting: Family Medicine

## 2023-09-16 DIAGNOSIS — H1033 Unspecified acute conjunctivitis, bilateral: Secondary | ICD-10-CM

## 2023-09-16 DIAGNOSIS — J4521 Mild intermittent asthma with (acute) exacerbation: Secondary | ICD-10-CM

## 2023-09-16 MED ORDER — GENTAMICIN SULFATE 0.3 % OP SOLN: 2.0000 [drp] | Freq: Three times a day (TID) | OPHTHALMIC | 0 refills | Status: AC

## 2023-09-16 MED ORDER — PREDNISONE 20 MG PO TABS: 40.0000 mg | ORAL_TABLET | Freq: Every day | ORAL | 0 refills | Status: AC

## 2023-09-16 MED ORDER — ALBUTEROL SULFATE (2.5 MG/3ML) 0.083% IN NEBU: 2.5000 mg | INHALATION_SOLUTION | RESPIRATORY_TRACT | 0 refills | Status: DC | PRN

## 2023-09-16 NOTE — ED Triage Notes (Signed)
 Patient here today with c/o bilat eye pain, redness, and drainage X 10 days. She has been using Systane eye drops with no relief. Patient states that she has a h/o asthma and has also been having some SOB for about 3 days. Patient uses albuterol.

## 2023-09-16 NOTE — Discharge Instructions (Addendum)
 Use albuterol in the nebulizer every 4 hours as needed for wheezing or shortness of breath. (Use albuterol en el nebulizador cada 4 horas segn sea necesario para la sibilancia o la dificultad para respirar.)  Take prednisone 20 mg--2 daily for 5 days; this is for inflammation in your lungs. [facility] prednisona 20 mg (2 dosis diarias) durante 5 das para la inflamacin pulmonar.)  Put gentamicin eyedrops in the affected eye(s) 3 times daily for 5 days. (Aplique gotas oftlmicas de gentamicina en el/los ojo(s) afectado(s) 3 veces al da durante 5 [address].)

## 2023-09-17 NOTE — ED Provider Notes (Signed)
 [facility]    CSN: [id number] Arrival date & time: 09/16/23  1813      History   Chief Complaint Chief Complaint  Patient presents with   Eye Problem    HPI Claudia Hernandez is a 73 y.o. female.    Eye Problem For redness and irritation of both eyes.  This has been going on for about 10 days.  They itch a lot and Claudia Hernandez will have drainage in the mornings.  Claudia Hernandez also has had more trouble with Claudia Hernandez asthma and wheezing and shortness of breath for the last 3 days.  No nasal congestion or sore throat and no fever or chills.  Claudia Hernandez is allergic to statins  Claudia Hernandez often has trouble with spring pollen, but Claudia Hernandez son states that Claudia Hernandez had to have antibiotic eyedrops last year to get better.  Past Medical History:  Diagnosis Date   Arthritis    Asthma    hx of   Chronic diffuse otitis externa of both ears 08/04/2021   Heart murmur    unsure of this, but was told Claudia Hernandez had one by MD in [place]   Hypertension     Patient Active Problem List   Diagnosis Date Noted   Vitamin B 12 deficiency 04/14/2023   Wrist pain, acute, left 07/22/2022   Statin intolerance 02/19/2022   Arthritis of finger of left hand 02/18/2022   Elevated liver enzymes 02/18/2022   SND (sensory-neural deafness), asymmetrical 02/18/2022   Bilateral knee pain 03/24/2021   Elevated LDL cholesterol level 06/17/2020   Bilateral hearing loss due to cerumen impaction 06/16/2020   Toe pain, right 05/13/2020   Essential hypertension 05/13/2020    Past Surgical History:  Procedure Laterality Date   BLADDER SURGERY     CESAREAN SECTION     CHOLECYSTECTOMY     gallbladder removed     left foot surgery      OB History   No obstetric history on file.      Home Medications    Prior to Admission medications   Medication Sig Start Date End Date Taking? Authorizing Provider  albuterol (PROVENTIL) (2.5 MG/3ML) 0.083% nebulizer solution Take 3 mLs (2.5 mg total) by nebulization every 4 (four) hours as  needed for wheezing or shortness of breath. 09/16/23  Yes Torin Whisner K, MD  gentamicin (GARAMYCIN) 0.3 % ophthalmic solution Place 2 drops into both eyes 3 (three) times daily for 5 days. 09/16/23 09/21/23 Yes Kadin Bera K, MD  predniSONE (DELTASONE) 20 MG tablet Take 2 tablets (40 mg total) by mouth daily with breakfast for 5 days. 09/16/23 09/21/23 Yes Ananda Sitzer K, MD  ACETAMINOPHEN PO Take by mouth. PRN    [provider]  Cholecalciferol (VITAMIN D3) 50 MCG (2000 UT) capsule Take 1 capsule (2,000 Units total) by mouth daily. 08/18/23   Hassie Lint, PA-C  COLLAGEN PO Take by mouth.    [provider]  cyanocobalamin (VITAMIN B12) 1000 MCG tablet Take 1 tablet (1,000 mcg total) by mouth daily. 08/18/23   Hassie Lint, PA-C  ezetimibe (ZETIA) 10 MG tablet Take 1 tablet (10 mg total) by mouth daily. 07/21/22   Vernell Goldsmith, MD  hydrocortisone cream 0.5 % Apply 1 Application topically 2 (two) times daily. 09/21/22   Raspet, Erin K, PA-C  losartan (COZAAR) 100 MG tablet Take 1 tablet (100 mg total) by mouth daily. 08/10/23   Hassie Lint, PA-C  meloxicam (MOBIC) 7.5 MG tablet Take 1 tablet (7.5  mg total) by mouth daily. 08/10/23   Hassie Lint, PA-C  olopatadine (PATADAY) 0.1 % ophthalmic solution Place 1 drop into both eyes 2 (two) times daily. 08/10/23   Hassie Lint, PA-C    Family History Family History  Problem Relation Age of Onset   Drug abuse Neg Hx    Hypertension Neg Hx    Colon cancer Neg Hx    Esophageal cancer Neg Hx    Rectal cancer Neg Hx    Stomach cancer Neg Hx    Breast cancer Neg Hx     Social History Social History   Tobacco Use   Smoking status: Never   Smokeless tobacco: Never  Vaping Use   Vaping status: Never Used  Substance Use Topics   Alcohol use: Not Currently   Drug use: Never     Allergies   Statins   Review of Systems Review of Systems   Physical Exam Triage Vital Signs ED Triage Vitals   Encounter Vitals Group     BP 09/16/23 2000 (!) 162/82     Systolic BP Percentile --      Diastolic BP Percentile --      Pulse Rate 09/16/23 2000 73     Resp 09/16/23 2000 16     Temp 09/16/23 2000 98.8 F (37.1 C)     Temp Source 09/16/23 2000 Oral     SpO2 09/16/23 2000 94 %     Weight 09/16/23 1958 121 lb 4.1 oz (55 kg)     Height 09/16/23 1958 5' 0.24" (1.53 m)     Head Circumference --      Peak Flow --      Pain Score 09/16/23 1958 10     Pain Loc --      Pain Education --      Exclude from Growth Chart --    No data found.  Updated Vital Signs BP (!) 162/82 (BP Location: Right Arm)   Pulse 73   Temp 98.8 F (37.1 C) (Oral)   Resp 16   Ht 5' 0.24" (1.53 m)   Wt 55 kg   SpO2 94%   BMI 23.50 kg/m   Visual Acuity Right Eye Distance:   Left Eye Distance:   Bilateral Distance:    Right Eye Near:   Left Eye Near:    Bilateral Near:     Physical Exam Vitals reviewed.  Constitutional:      General: Claudia Hernandez is not in acute distress.    Appearance: Claudia Hernandez is not ill-appearing, toxic-appearing or diaphoretic.  HENT:     Nose: Nose normal.     Mouth/Throat:     Mouth: Mucous membranes are moist.     Pharynx: No oropharyngeal exudate or posterior oropharyngeal erythema.  Eyes:     Extraocular Movements: Extraocular movements intact.     Pupils: Pupils are equal, round, and reactive to light.     Comments: The conjunctiva are injected.  The lids are not swollen but are mildly erythematous also.  Cardiovascular:     Rate and Rhythm: Normal rate and regular rhythm.     Heart sounds: No murmur heard. Pulmonary:     Effort: No respiratory distress.     Breath sounds: No stridor. No wheezing, rhonchi or rales.  Musculoskeletal:     Cervical back: Neck supple.  Lymphadenopathy:     Cervical: No cervical adenopathy.  Skin:    Coloration: Skin is not pale.  Neurological:     Mental  Status: Claudia Hernandez is alert and oriented to person, place, and time.  Psychiatric:         Behavior: Behavior normal.      UC Treatments / Results  Labs (all labs ordered are listed, but only abnormal results are displayed) Labs Reviewed - No data to display  EKG   Radiology No results found.  Procedures Procedures (including critical care time)  Medications Ordered in UC Medications - No data to display  Initial Impression / Assessment and Plan / UC Course  I have reviewed the triage vital signs and the nursing notes.  Pertinent labs & imaging results that were available during my care of the patient were reviewed by me and considered in my medical decision making (see chart for details).     5 days burst of prednisone and albuterol for nebulization are sent into the pharmacy.  Also gentamicin is sent in to treat potential conjunctivitis.  If some of Claudia Hernandez eye symptoms are still allergic, I am hoping that the prednisone will help Claudia Hernandez in that regard also. Final Clinical Impressions(s) / UC Diagnoses   Final diagnoses:  Mild intermittent asthma with acute exacerbation  Acute conjunctivitis of both eyes, unspecified acute conjunctivitis type     Discharge Instructions      Use albuterol in the nebulizer every 4 hours as needed for wheezing or shortness of breath. (Use albuterol en el nebulizador cada 4 horas segn sea necesario para la sibilancia o la dificultad para respirar.)  Take prednisone 20 mg--2 daily for 5 days; this is for inflammation in your lungs. (Tome prednisona 20 mg (2 dosis diarias) durante 5 das para la inflamacin pulmonar.)  Put gentamicin eyedrops in the affected eye(s) 3 times daily for 5 days. (Aplique gotas oftlmicas de gentamicina en el/los ojo(s) afectado(s) 3 veces al da durante 5 [address].)     ED Prescriptions     Medication Sig Dispense Auth. Provider   albuterol (PROVENTIL) (2.5 MG/3ML) 0.083% nebulizer solution Take 3 mLs (2.5 mg total) by nebulization every 4 (four) hours as needed for wheezing or shortness of breath. 225 mL  Ann Keto, MD   gentamicin (GARAMYCIN) 0.3 % ophthalmic solution Place 2 drops into both eyes 3 (three) times daily for 5 days. 5 mL Ann Keto, MD   predniSONE (DELTASONE) 20 MG tablet Take 2 tablets (40 mg total) by mouth daily with breakfast for 5 days. 10 tablet [place] Haas Chrystina Naff K, MD      PDMP not reviewed this encounter.   Ann Keto, MD 09/17/23 [phone]

## 2023-09-27 ENCOUNTER — Encounter (HOSPITAL_COMMUNITY): Payer: Self-pay

## 2023-09-27 ENCOUNTER — Ambulatory Visit (HOSPITAL_COMMUNITY)
Admission: EM | Admit: 2023-09-27 | Discharge: 2023-09-27 | Disposition: A | Discharge status: Home / Self Care | Payer: Self-pay | Attending: Emergency Medicine | Admitting: Emergency Medicine

## 2023-09-27 DIAGNOSIS — H1033 Unspecified acute conjunctivitis, bilateral: Secondary | ICD-10-CM

## 2023-09-27 MED ORDER — ERYTHROMYCIN 5 MG/GM OP OINT: TOPICAL_OINTMENT | OPHTHALMIC | 0 refills | Status: AC

## 2023-09-27 MED ORDER — LORATADINE 10 MG PO TABS: 10.0000 mg | ORAL_TABLET | Freq: Every day | ORAL | 0 refills | Status: DC

## 2023-09-27 MED ORDER — FLUTICASONE PROPIONATE 50 MCG/ACT NA SUSP: 1.0000 | Freq: Every day | NASAL | 0 refills | Status: DC

## 2023-09-27 NOTE — Discharge Instructions (Addendum)
 Stop using gentamicin  eyedrops and start using erythromycin  ointment.  4 times daily for 7 days. Use Flonase  nasal spray once daily to help with allergy related symptoms. Start taking loratadine  at bedtime to assist with symptoms. Continue using Pataday  eyedrops 2 times daily to help with eye redness and itching. Follow-up with allergy specialist as symptoms persist. Follow-up with primary care provider or return here as needed.  Deje de usar las gotas oftlmicas de gentamicina y comience a usar ungento de Engineer, materials. 4 veces al da durante [address]. Use el espray nasal Flonase  una vez al da para [employer] sntomas relacionados con la Programmer, multimedia. Comience a tomar loratadina antes de acostarse para [employer]. Contine usando las gotas oftlmicas Pataday  2 veces al da para Acupuncturist enrojecimiento y la picazn ocular. Consulte con un alerglogo si los sntomas persisten. Consulte con su mdico de cabecera o regrese aqu segn sea necesario.

## 2023-09-27 NOTE — ED Provider Notes (Signed)
 [facility]    CSN: [id number] Arrival date & time: 09/27/23  1918      History   Chief Complaint Chief Complaint  Patient presents with   Eye Drainage    HPI Claudia Hernandez Bonita Bussing is a 73 y.o. female.   Patient presents with bilateral eye redness, drainage, and irritation x 4 days.  Patient states that she has been using gentamicin  eyedrops without relief.  Patient states that she was prescribed prednisone  and once she stopped taking the prednisone  her symptoms started back up.  Patient was seen on 4/11 for asthma exacerbation and conjunctivitis and was prescribed prednisone  for asthma exacerbation at that time.  Patient denies taking any other medications for her symptoms.  The history is provided by the patient, a relative and medical records. The history is limited by a language barrier. No language interpreter was used (Declined interpreter, son translating).    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      Home Medications    Prior to Admission medications    Medication Sig Start Date End Date Taking? Authorizing Provider  erythromycin  ophthalmic ointment Place a 1/2 inch ribbon of ointment into the lower eyelid. 09/27/23  Yes Rosevelt Constable, Demari Kropp A, NP  fluticasone  (FLONASE ) 50 MCG/ACT nasal spray Place 1 spray into both nostrils daily. 09/27/23  Yes Rosevelt Constable, Thanh Mottern A, NP  loratadine  (CLARITIN ) 10 MG tablet Take 1 tablet (10 mg total) by mouth daily. 09/27/23  Yes Levora Reas A, NP  ACETAMINOPHEN  PO Take by mouth. PRN    [provider]  albuterol  (PROVENTIL ) (2.5 MG/3ML) 0.083% nebulizer solution Take 3 mLs (2.5 mg total) by nebulization every 4 (four) hours as needed for wheezing or shortness of breath. 09/16/23   Ann Keto, MD  Cholecalciferol  (VITAMIN D3) 50 MCG (2000 UT) capsule Take 1 capsule (2,000 Units total) by mouth daily. 08/18/23   Hassie Lint, PA-C  COLLAGEN PO Take by mouth.    [provider]  cyanocobalamin  (VITAMIN B12) 1000 MCG tablet Take 1 tablet (1,000 mcg total) by mouth daily. 08/18/23   Hassie Lint, PA-C  ezetimibe  (ZETIA ) 10 MG tablet Take 1 tablet (10 mg total) by mouth daily. 07/21/22   Vernell Goldsmith, MD  hydrocortisone  cream 0.5 % Apply 1 Application topically 2 (two) times daily. 09/21/22   Raspet, Erin K, PA-C  losartan  (COZAAR ) 100 MG tablet Take 1 tablet (100  mg total) by mouth daily. 08/10/23   Hassie Lint, PA-C  meloxicam  (MOBIC ) 7.5 MG tablet Take 1 tablet (7.5 mg total) by mouth daily. 08/10/23   Hassie Lint, PA-C  olopatadine  (PATADAY ) 0.1 % ophthalmic solution Place 1 drop into both eyes 2 (two) times daily. 08/10/23   Hassie Lint, PA-C    Family History Family History  Problem Relation Age of Onset   Drug abuse Neg Hx    Hypertension Neg Hx    Colon cancer Neg Hx    Esophageal cancer Neg Hx    Rectal cancer Neg Hx    Stomach cancer Neg Hx    Breast cancer Neg Hx     Social History Social History   Tobacco Use   Smoking status: Never   Smokeless  tobacco: Never  Vaping Use   Vaping status: Never Used  Substance Use Topics   Alcohol use: Not Currently   Drug use: Never     Allergies   Statins   Review of Systems Review of Systems  Per HPI  Physical Exam Triage Vital Signs ED Triage Vitals  Encounter Vitals Group     BP 09/27/23 1933 (!) 145/75     Systolic BP Percentile --      Diastolic BP Percentile --      Pulse Rate 09/27/23 1933 63     Resp 09/27/23 1933 18     Temp 09/27/23 1933 98.1 F (36.7 C)     Temp Source 09/27/23 1933 Oral     SpO2 09/27/23 1933 96 %     Weight --      Height --      Head Circumference --      Peak Flow --      Pain Score 09/27/23 1934 7     Pain Loc --      Pain Education --      Exclude from Growth Chart --    No data found.  Updated Vital Signs BP (!) 145/75 (BP Location: Left Arm)   Pulse 63   Temp 98.1 F (36.7 C) (Oral)   Resp 18   SpO2 96%   Visual Acuity Right Eye Distance:   Left Eye Distance:   Bilateral Distance:    Right Eye Near:   Left Eye Near:    Bilateral Near:     Physical Exam Vitals and nursing note reviewed.  Constitutional:      General: She is awake. She is not in acute distress.    Appearance: Normal appearance. She is well-developed and well-groomed. She is not ill-appearing.  HENT:     Nose: Congestion and rhinorrhea present.     Mouth/Throat:     Mouth: Mucous membranes are moist.     Pharynx: Oropharynx is clear.  Eyes:     General:        Right eye: Discharge present.        Left eye: Discharge present.    Extraocular Movements: Extraocular movements intact.     Conjunctiva/sclera:     Right eye: Right conjunctiva is injected.     Left eye: Left conjunctiva is injected.     Pupils: Pupils are equal, round, and reactive to light.  Skin:    General: Skin is warm and dry.  Neurological:     Mental Status: She is alert.  Psychiatric:        Behavior: Behavior is cooperative.      UC Treatments / Results  Labs (  all  labs ordered are listed, but only abnormal results are displayed) Labs Reviewed - No data to display  EKG   Radiology No results found.  Procedures Procedures (including critical care time)  Medications Ordered in UC Medications - No data to display  Initial Impression / Assessment and Plan / UC Course  I have reviewed the triage vital signs and the nursing notes.  Pertinent labs & imaging results that were available during my care of the patient were reviewed by me and considered in my medical decision making (see chart for details).     Patient is well-appearing.  Vitals are stable.  Mild congestion and rhinorrhea present.  Bilateral conjunctiva injected with crusty discharge noted from bilateral eyes.  Recommended stopping gentamicin  eyedrops and starting erythromycin  for possible bacterial conjunctivitis coverage.  Prescribed loratadine  and Flonase  to treat for allergy related conjunctivitis.  Recommended using Pataday  eyedrops that were previously prescribed to her as needed for eye redness and irritation.  Attached allergy specialist to follow-up with if symptoms persist.  Discussed follow-up and return precautions. Final Clinical Impressions(s) / UC Diagnoses   Final diagnoses:  Acute conjunctivitis of both eyes, unspecified acute conjunctivitis type     Discharge Instructions      Stop using gentamicin  eyedrops and start using erythromycin  ointment.  4 times daily for 7 days. Use Flonase  nasal spray once daily to help with allergy related symptoms. Start taking loratadine  at bedtime to assist with symptoms. Continue using Pataday  eyedrops 2 times daily to help with eye redness and itching. Follow-up with allergy specialist as symptoms persist. Follow-up with primary care provider or return here as needed.  Deje de usar las gotas oftlmicas de gentamicina y comience a usar ungento de Engineer, materials. 4 veces al da durante [address]. Use el espray nasal Flonase  una vez al  da para [employer] sntomas relacionados con la Programmer, multimedia. Comience a tomar loratadina antes de acostarse para [employer]. Contine usando las gotas oftlmicas Pataday  2 veces al da para Acupuncturist enrojecimiento y la picazn ocular. Consulte con un alerglogo si los sntomas persisten. Consulte con su mdico de cabecera o regrese aqu segn sea necesario.    ED Prescriptions     Medication Sig Dispense Auth. Provider   erythromycin  ophthalmic ointment Place a 1/2 inch ribbon of ointment into the lower eyelid. 3.5 g Levora Reas A, NP   loratadine  (CLARITIN ) 10 MG tablet Take 1 tablet (10 mg total) by mouth daily. 30 tablet Levora Reas A, NP   fluticasone  (FLONASE ) 50 MCG/ACT nasal spray Place 1 spray into both nostrils daily. 9.9 mL Levora Reas A, NP      PDMP not reviewed this encounter.   Levora Reas A, NP 09/27/23 2013

## 2023-09-27 NOTE — ED Triage Notes (Signed)
 Per son, pt here on 4/11 and tx'd for allergies with prednisone . States bilateral eye redness, drainage, and irritation x4 days after completing prednisone .

## 2023-10-11 ENCOUNTER — Other Ambulatory Visit (HOSPITAL_COMMUNITY): Payer: Self-pay

## 2023-10-11 ENCOUNTER — Other Ambulatory Visit: Payer: Self-pay

## 2023-11-14 ENCOUNTER — Ambulatory Visit: Payer: Self-pay | Admitting: Family Medicine

## 2023-11-14 ENCOUNTER — Ambulatory Visit: Payer: Self-pay | Admitting: Internal Medicine

## 2023-11-16 ENCOUNTER — Ambulatory Visit: Payer: Self-pay | Admitting: Family Medicine

## 2023-12-21 ENCOUNTER — Other Ambulatory Visit: Payer: Self-pay | Admitting: Critical Care Medicine

## 2023-12-21 ENCOUNTER — Other Ambulatory Visit: Payer: Self-pay

## 2023-12-22 ENCOUNTER — Other Ambulatory Visit: Payer: Self-pay

## 2023-12-22 ENCOUNTER — Encounter: Payer: Self-pay | Admitting: Pharmacist

## 2023-12-30 ENCOUNTER — Telehealth: Payer: Self-pay | Admitting: Internal Medicine

## 2023-12-30 NOTE — Telephone Encounter (Signed)
 Called patient to confirm upcoming appointment 01/03/2024 at 4:10 pm. Patient appointment has been successfully confirmed.

## 2024-01-03 ENCOUNTER — Other Ambulatory Visit: Payer: Self-pay

## 2024-01-03 ENCOUNTER — Encounter: Payer: Self-pay | Admitting: Family Medicine

## 2024-01-03 ENCOUNTER — Ambulatory Visit: Payer: Self-pay | Attending: Family Medicine | Admitting: Family Medicine

## 2024-01-03 VITALS — BP 110/71 | HR 64 | Ht 62.0 in | Wt 125.6 lb

## 2024-01-03 DIAGNOSIS — M19032 Primary osteoarthritis, left wrist: Secondary | ICD-10-CM

## 2024-01-03 DIAGNOSIS — M25531 Pain in right wrist: Secondary | ICD-10-CM

## 2024-01-03 DIAGNOSIS — E559 Vitamin D deficiency, unspecified: Secondary | ICD-10-CM

## 2024-01-03 DIAGNOSIS — L603 Nail dystrophy: Secondary | ICD-10-CM

## 2024-01-03 DIAGNOSIS — Z131 Encounter for screening for diabetes mellitus: Secondary | ICD-10-CM

## 2024-01-03 DIAGNOSIS — I10 Essential (primary) hypertension: Secondary | ICD-10-CM

## 2024-01-03 MED ORDER — LOSARTAN POTASSIUM 100 MG PO TABS
100.0000 mg | ORAL_TABLET | Freq: Every day | ORAL | 1 refills | Status: DC
  Filled 2024-01-03: qty 90, 90d supply, fill #0
  Filled 2024-04-09: qty 90, 90d supply, fill #1

## 2024-01-03 NOTE — Progress Notes (Signed)
 Subjective:  Patient ID: Claudia Hernandez, female    DOB: 1950-10-05  Age: 73 y.o. MRN: [id number]  CC: Medical Management of Chronic Issues (Hand pain/Lab work)     Discussed the use of AI scribe software for clinical note transcription with the patient, who gave verbal consent to proceed.  History of Present Illness Claudia Hernandez is a 73 year old female with retention, tremors, arthritis who presents for blood work and evaluation of hand pain.  She is here for blood work to check triglycerides, cholesterol, and vitamin B12 levels. Her vitamin B12 was previously very low for which she was on B12 injections but her last blood work revealed normal B12 level.  She has been taking oral B12 supplements.  She also had vitamin D  deficiency and has been on replacement.  Her cholesterol was checked in March, showing normal triglycerides and slightly elevated LDL cholesterol.  She has been on Zetia .  She has ongoing issues with her left hand and is experiencing swelling and discomfort in her right wrist, especially when twisting it. She has arthritis in her left wrist, confirmed by x-ray, and had a fracture in her left wrist at age 31. There is no numbness in the wrist.  She is concerned about her nails, which are weak, break easily, and have a textured appearance with lines. She is interested in vitamins to improve this condition.  She is currently taking statin for her blood pressure. Accompanied by family member to this appointment.   Past Medical History:  Diagnosis Date   Arthritis    Asthma    hx of   Chronic diffuse otitis externa of both ears 08/04/2021   Heart murmur    unsure of this, but was told she had one by MD in [place]   Hypertension     Past Surgical History:  Procedure Laterality Date   BLADDER SURGERY     CESAREAN SECTION     CHOLECYSTECTOMY     gallbladder removed     left foot surgery      Family History  Problem Relation Age of Onset    Drug abuse Neg Hx    Hypertension Neg Hx    Colon cancer Neg Hx    Esophageal cancer Neg Hx    Rectal cancer Neg Hx    Stomach cancer Neg Hx    Breast cancer Neg Hx     Social History   Socioeconomic History   Marital status: Single    Spouse name: Not on file   Number of children: 5   Years of education: Not on file   Highest education level: Bachelor's degree (e.g., BA, AB, BS)  Occupational History   Not on file  Tobacco Use   Smoking status: Never   Smokeless tobacco: Never  Vaping Use   Vaping status: Never Used  Substance and Sexual Activity   Alcohol use: Not Currently   Drug use: Never   Sexual activity: Not on file  Other Topics Concern   Not on file  Social History Narrative   Not on file   Social Drivers of Health   Financial Resource Strain: Not on file  Food Insecurity: Food Insecurity Present (02/26/2021)   Hunger Vital Sign    Worried About Running Out of Food in the Last Year: Sometimes true    Ran Out of Food in the Last Year: Sometimes true  Transportation Needs: Unmet Transportation Needs (02/26/2021)   PRAPARE - Transportation  Lack of Transportation (Medical): Yes    Lack of Transportation (Non-Medical): Yes  Physical Activity: Sufficiently Active (04/18/2023)   Exercise Vital Sign    Days of Exercise per Week: 3 days    Minutes of Exercise per Session: 60 min  Stress: Not on file  Social Connections: Not on file    Allergies  Allergen Reactions   Statins Other (See Comments)    ELEVATED LFTs    Outpatient Medications Prior to Visit  Medication Sig Dispense Refill   ACETAMINOPHEN  PO Take by mouth. PRN     albuterol  (PROVENTIL ) (2.5 MG/3ML) 0.083% nebulizer solution Take 3 mLs (2.5 mg total) by nebulization every 4 (four) hours as needed for wheezing or shortness of breath. 225 mL 0   Cholecalciferol  (VITAMIN D3) 50 MCG (2000 UT) capsule Take 1 capsule (2,000 Units total) by mouth daily. 100 capsule 1   COLLAGEN PO Take by mouth.      cyanocobalamin  (VITAMIN B12) 1000 MCG tablet Take 1 tablet (1,000 mcg total) by mouth daily. 130 tablet 1   erythromycin  ophthalmic ointment Place a 1/2 inch ribbon of ointment into the lower eyelid. 3.5 g 0   ezetimibe  (ZETIA ) 10 MG tablet Take 1 tablet (10 mg total) by mouth daily. 90 tablet 3   fluticasone  (FLONASE ) 50 MCG/ACT nasal spray Place 1 spray into both nostrils daily. 9.9 mL 0   hydrocortisone  cream 0.5 % Apply 1 Application topically 2 (two) times daily. 30 g 0   loratadine  (CLARITIN ) 10 MG tablet Take 1 tablet (10 mg total) by mouth daily. 30 tablet 0   meloxicam  (MOBIC ) 7.5 MG tablet Take 1 tablet (7.5 mg total) by mouth daily. 90 tablet 1   olopatadine  (PATADAY ) 0.1 % ophthalmic solution Place 1 drop into both eyes 2 (two) times daily. 5 mL 3   losartan  (COZAAR ) 100 MG tablet Take 1 tablet (100 mg total) by mouth daily. 90 tablet 1   Facility-Administered Medications Prior to Visit  Medication Dose Route Frequency Provider Last Rate Last Admin   cyanocobalamin  (VITAMIN B12) injection 1,000 mcg  1,000 mcg Intramuscular Once          ROS Review of Systems  Constitutional:  Negative for activity change and appetite change.  HENT:  Negative for sinus pressure and sore throat.   Respiratory:  Negative for chest tightness, shortness of breath and wheezing.   Cardiovascular:  Negative for chest pain and palpitations.  Gastrointestinal:  Negative for abdominal distention, abdominal pain and constipation.  Genitourinary: Negative.   Musculoskeletal:        See HPI  Psychiatric/Behavioral:  Negative for behavioral problems and dysphoric mood.     Objective:  BP 110/71   Pulse 64   Ht 5' 2 (1.575 m)   Wt 125 lb 9.6 oz (57 kg)   SpO2 98%   BMI 22.97 kg/m      01/03/2024    4:23 PM 09/27/2023    7:33 PM 09/16/2023    8:00 PM  BP/Weight  Systolic BP 110 145 162  Diastolic BP 71 75 82  Wt. (Lbs) 125.6    BMI 22.97 kg/m2        Physical Exam Constitutional:       Appearance: She is well-developed.  Cardiovascular:     Rate and Rhythm: Normal rate.     Heart sounds: Normal heart sounds. No murmur heard. Pulmonary:     Effort: Pulmonary effort is normal.     Breath sounds: Normal breath sounds. No  wheezing or rales.  Chest:     Chest wall: No tenderness.  Abdominal:     General: Bowel sounds are normal. There is no distension.     Palpations: Abdomen is soft. There is no mass.     Tenderness: There is no abdominal tenderness.  Musculoskeletal:     Right lower leg: No edema.     Left lower leg: No edema.     Comments: Slight edema of lateral aspect of dorsum of right wrist with tenderness on extension of wrist Left wrist is normal Slight tenderness on range of motion of left knee  Skin:    Comments: Ridging of nails  Neurological:     Mental Status: She is alert and oriented to person, place, and time.  Psychiatric:        Mood and Affect: Mood normal.        Latest Ref Rng & Units 04/11/2023    4:56 PM 02/18/2022   10:47 AM 01/21/2022    5:25 PM  CMP  Glucose 70 - 99 mg/dL 94   88   BUN 8 - 27 mg/dL 12   10   Creatinine 9.42 - 1.00 mg/dL 9.41   9.41   Sodium 865 - 144 mmol/L 143   137   Potassium 3.5 - 5.2 mmol/L 4.3   3.5   Chloride 96 - 106 mmol/L 104   106   CO2 20 - 29 mmol/L 24   24   Calcium  8.7 - 10.3 mg/dL 9.4   8.8   Total Protein 6.0 - 8.5 g/dL 7.0  7.2  7.1   Total Bilirubin 0.0 - 1.2 mg/dL <9.7  0.4  0.5   Alkaline Phos 44 - 121 IU/L 103  85  73   AST 0 - 40 IU/L 23  24  56   ALT 0 - 32 IU/L 21  17  56     Lipid Panel     Component Value Date/Time   CHOL 183 08/17/2023 1025   TRIG 105 08/17/2023 1025   HDL 53 08/17/2023 1025   CHOLHDL 3.5 08/17/2023 1025   LDLCALC 111 (H) 08/17/2023 1025    CBC    Component Value Date/Time   WBC 10.9 (H) 08/17/2023 1025   WBC 5.9 01/21/2022 1725   RBC 4.59 08/17/2023 1025   RBC 3.87 01/21/2022 1725   HGB 14.0 08/17/2023 1025   HCT 43.1 08/17/2023 1025   PLT 273  08/17/2023 1025   MCV 94 08/17/2023 1025   MCH 30.5 08/17/2023 1025   MCH 36.2 (H) 01/21/2022 1725   MCHC 32.5 08/17/2023 1025   MCHC 34.5 01/21/2022 1725   RDW 12.0 08/17/2023 1025   LYMPHSABS 1.7 08/17/2023 1025   MONOABS 0.5 09/23/2020 1714   EOSABS 1.1 (H) 08/17/2023 1025   BASOSABS 0.1 08/17/2023 1025    Lab Results  Component Value Date   HGBA1C 5.2 08/04/2021      1. Vitamin D  deficiency (Primary) Currently on vitamin D  and B12 supplements - VITAMIN D  25 Hydroxy (Vit-D Deficiency, Fractures); Future - Vitamin B12; Future  2. Osteoarthritis of left wrist, unspecified osteoarthritis type -Stable - Completed PT Continue meloxicam   3. Right wrist pain - DG Wrist Complete Right; Future  4. Brittle nails Will check labs to exclude underlying metabolic condition Advised to use OTC Biotene  5. Essential hypertension Controlled Counseled on blood pressure goal of less than 130/80, low-sodium, DASH diet, medication compliance, 150 minutes of moderate intensity exercise per week.  Discussed medication compliance, adverse effects. - LP+Non-HDL Cholesterol; Future - CMP14+EGFR; Future - losartan  (COZAAR ) 100 MG tablet; Take 1 tablet (100 mg total) by mouth daily.  Dispense: 90 tablet; Refill: 1  6. Screening for diabetes mellitus - Hemoglobin A1c; Future   Meds ordered this encounter  Medications   losartan  (COZAAR ) 100 MG tablet    Sig: Take 1 tablet (100 mg total) by mouth daily.    Dispense:  90 tablet    Refill:  1    Follow-up: Return in about 6 months (around 07/05/2024) for Chronic medical conditions.       Corrina Sabin, MD, FAAFP. [facility] [place], [place] [phone]   01/03/2024, 5:19 PM

## 2024-01-03 NOTE — Patient Instructions (Signed)
Artritis Arthritis La palabra "artritis" significa tener dolor en las articulaciones. Tambin puede referirse a una enfermedad de las articulaciones. Una articulacin es Immunologist en el que se juntan los [place]. Hay ms de 100 tipos de artritis. Cules son las causas? Desgaste de Risk analyst. Esta es la causa ms frecuente. Cantidad elevada de una sustancia bioqumica llamada "cido rico" en la sangre, que provoca dolor en la articulacin (gota). Dolor e hinchazn (inflamacin) en la articulacin. Infeccin en una articulacin. Lesiones en una articulacin. Neomia Dear reaccin a medicamentos (alergia). En algunos casos, es posible que la causa se desconozca. Cules son los signos o los sntomas? Dolor en una articulacin al [employer]. Enrojecimiento de Risk analyst. Hinchazn en una articulacin. Rigidez en una articulacin. Calor que proviene de Nurse, learning disability. Grant Ruts. Sensacin de estar enfermo. Cmo se trata? El tratamiento de esta afeccin puede incluir: Dar tratamiento a la causa si se la conoce. Reposo. Levantamiento (elevacin) de la articulacin. Aplicar compresas fras o calientes sobre Nurse, learning disability. Medicamentos para tratar los sntomas y reducir Chief Technology Officer y la hinchazn. Inyecciones de medicamentos, como cortisona, Nurse, learning disability. Tambin pueden indicarle que haga cambios en su vida, como hacer ejercicio y Publishing copy de [place]. Siga estas instrucciones en su casa: Medicamentos Use los medicamentos de venta libre y los recetados solamente como se lo haya indicado el mdico. No tome aspirina para el dolor si el mdico le dice que puede tener gota. Actividad Ponga la articulacin en reposo si el mdico se lo indica. Evite las actividades que [employer]. Haga ejercicios para la articulacin con regularidad tal como se lo indic el mdico. Pruebe hacer ejercicios como: Natacin. Gimnasia acutica. Andar en bicicleta. Caminar. Control del dolor, la rigidez y la  hinchazn     Si se lo indican, aplique hielo en la zona afectada. Para hacer esto: Ponga el hielo en una bolsa plstica. Coloque una toalla entre la piel y Copy. Aplique el hielo durante 20 minutos, 2 a 3 veces por da. Retire el hielo si la piel se le pone de color rojo brillante. Esto es [employer]. Si no puede sentir dolor, calor o fro, tiene un mayor riesgo de que se dae la zona. Si la articulacin est hinchada, levntela (elvela) por encima del nivel del corazn como se lo haya indicado el mdico. Si tiene la articulacin rgida por la maana, pruebe tomar una ducha tibia. Si se lo indican, aplique calor en la zona afectada. Hgalo con la frecuencia que le haya indicado el mdico. Use la fuente de calor que el mdico le recomiende, como una compresa de calor hmedo o una almohadilla trmica. Si tiene diabetes, no aplique calor sin consultar a su mdico. Para aplicar calor: Coloque una toalla entre la piel y la fuente de Airline pilot. Aplique calor durante 20 a 30 minutos. Retire la fuente de calor si la piel se le pone de color rojo brillante. Esto es [employer]. Si no puede sentir dolor, calor o fro, tiene un mayor riesgo de [place]. Instrucciones generales Mantenga un peso saludable. Siga las instrucciones de su mdico con respecto al control de su peso. No fume ni consuma ningn producto que contenga nicotina o tabaco. Si necesita ayuda para dejar de fumar, consulte al mdico. Concurra a todas las visitas de seguimiento. Dnde buscar ms informacin [employer] of Health (Institutos Nacionales de la Salud): www.niams.http://www.myers.net/ Comunquese con un mdico si: El Product/process development scientist. Tiene fiebre. Solicite ayuda de inmediato si: Tiene un dolor muy intenso en la  articulacin. Tiene hinchazn en la articulacin. La articulacin est roja. Comienza a tener hinchazn y Tax adviser. Tiene un dolor muy intenso en la espalda. Tiene la pierna muy  dbil. Resumen La palabra "artritis" significa tener dolor en las articulaciones. Tambin puede referirse a una enfermedad de las articulaciones. Una articulacin es Immunologist en el que se juntan los [place]. La causa ms frecuente de esta afeccin es el desgaste de una articulacin. Los principales sntomas de esta afeccin son enrojecimiento, hinchazn o Geophysical data processor. Esta afeccin se trata con reposo, elevacin de la articulacin, medicamentos y colocando compresas fras o calientes sobre la articulacin. Siga las instrucciones del mdico acerca de los medicamentos, la [place], los ejercicios y otros tratamientos para el cuidado Facilities manager. Esta informacin no tiene Theme park manager el consejo del mdico. Asegrese de hacerle al mdico cualquier pregunta que tenga. Document Revised: 04/02/2021 Document Reviewed: 04/02/2021 Elsevier Patient Education  2024 [employer].

## 2024-01-19 ENCOUNTER — Other Ambulatory Visit (HOSPITAL_BASED_OUTPATIENT_CLINIC_OR_DEPARTMENT_OTHER): Payer: Self-pay

## 2024-01-24 ENCOUNTER — Ambulatory Visit (HOSPITAL_COMMUNITY)
Admission: RE | Admit: 2024-01-24 | Discharge: 2024-01-24 | Disposition: A | Discharge status: Home / Self Care | Payer: Self-pay | Source: Ambulatory Visit | Attending: Family Medicine | Admitting: Family Medicine

## 2024-01-24 ENCOUNTER — Ambulatory Visit: Payer: Self-pay | Attending: Nurse Practitioner

## 2024-01-24 DIAGNOSIS — E559 Vitamin D deficiency, unspecified: Secondary | ICD-10-CM

## 2024-01-24 DIAGNOSIS — Z131 Encounter for screening for diabetes mellitus: Secondary | ICD-10-CM

## 2024-01-24 DIAGNOSIS — M25531 Pain in right wrist: Secondary | ICD-10-CM | POA: Insufficient documentation

## 2024-01-24 DIAGNOSIS — I10 Essential (primary) hypertension: Secondary | ICD-10-CM

## 2024-01-25 ENCOUNTER — Other Ambulatory Visit: Payer: Self-pay

## 2024-01-25 ENCOUNTER — Ambulatory Visit: Payer: Self-pay | Admitting: Family Medicine

## 2024-01-25 DIAGNOSIS — E538 Deficiency of other specified B group vitamins: Secondary | ICD-10-CM

## 2024-01-25 DIAGNOSIS — M25431 Effusion, right wrist: Secondary | ICD-10-CM

## 2024-01-25 LAB — CMP14+EGFR
ALT: 27 IU/L (ref 0–32)
AST: 32 IU/L (ref 0–40)
Albumin: 4.4 g/dL (ref 3.8–4.8)
Alkaline Phosphatase: 94 IU/L (ref 44–121)
BUN/Creatinine Ratio: 31 — ABNORMAL HIGH (ref 12–28)
BUN: 19 mg/dL (ref 8–27)
Bilirubin Total: 0.3 mg/dL (ref 0.0–1.2)
CO2: 21 mmol/L (ref 20–29)
Calcium: 9.4 mg/dL (ref 8.7–10.3)
Chloride: 104 mmol/L (ref 96–106)
Creatinine, Ser: 0.62 mg/dL (ref 0.57–1.00)
Globulin, Total: 2.3 g/dL (ref 1.5–4.5)
Glucose: 87 mg/dL (ref 70–99)
Potassium: 4.2 mmol/L (ref 3.5–5.2)
Sodium: 140 mmol/L (ref 134–144)
Total Protein: 6.7 g/dL (ref 6.0–8.5)
eGFR: 95 mL/min/1.73 (ref >59)

## 2024-01-25 LAB — LP+NON-HDL CHOLESTEROL
Cholesterol, Total: 175 mg/dL (ref 100–199)
HDL: 45 mg/dL (ref >39)
LDL Chol Calc (NIH): 93 mg/dL (ref 0–99)
Total Non-HDL-Chol (LDL+VLDL): 130 mg/dL — ABNORMAL HIGH (ref 0–129)
Triglycerides: 220 mg/dL — ABNORMAL HIGH (ref 0–149)
VLDL Cholesterol Cal: 37 mg/dL (ref 5–40)

## 2024-01-25 LAB — HEMOGLOBIN A1C
Est. average glucose Bld gHb Est-mCnc: 105 mg/dL
Hgb A1c MFr Bld: 5.3 % (ref 4.8–5.6)

## 2024-01-25 LAB — VITAMIN B12: Vitamin B-12: 159 pg/mL — ABNORMAL LOW (ref 232–1245)

## 2024-01-25 LAB — VITAMIN D 25 HYDROXY (VIT D DEFICIENCY, FRACTURES): Vit D, 25-Hydroxy: 25.4 ng/mL — ABNORMAL LOW (ref 30.0–100.0)

## 2024-01-25 MED ORDER — ERGOCALCIFEROL 1.25 MG (50000 UT) PO CAPS
50000.0000 [IU] | ORAL_CAPSULE | ORAL | 1 refills | Status: AC
  Filled 2024-01-25 – 2024-05-29 (×2): qty 12, 84d supply, fill #0

## 2024-01-25 MED ORDER — VITAMIN B-12 1000 MCG PO TABS
1000.0000 ug | ORAL_TABLET | Freq: Every day | ORAL | 1 refills | Status: AC
  Filled 2024-01-25: qty 130, 130d supply, fill #0

## 2024-02-01 ENCOUNTER — Other Ambulatory Visit: Payer: Self-pay

## 2024-02-03 ENCOUNTER — Other Ambulatory Visit: Payer: Self-pay

## 2024-02-13 ENCOUNTER — Ambulatory Visit (HOSPITAL_COMMUNITY): Payer: Self-pay | Attending: Family Medicine

## 2024-02-22 ENCOUNTER — Other Ambulatory Visit: Payer: Self-pay

## 2024-02-24 ENCOUNTER — Ambulatory Visit (HOSPITAL_COMMUNITY)
Admission: RE | Admit: 2024-02-24 | Discharge: 2024-02-24 | Disposition: A | Discharge status: Home / Self Care | Payer: Self-pay | Source: Ambulatory Visit | Attending: Family Medicine | Admitting: Family Medicine

## 2024-02-24 DIAGNOSIS — M25431 Effusion, right wrist: Secondary | ICD-10-CM | POA: Insufficient documentation

## 2024-02-24 DIAGNOSIS — M25531 Pain in right wrist: Secondary | ICD-10-CM | POA: Insufficient documentation

## 2024-02-24 MED ORDER — GADOBUTROL 1 MMOL/ML IV SOLN
6.0000 mL | Freq: Once | INTRAVENOUS | Status: AC | PRN
Start: 2024-02-24 — End: 2024-02-24
  Administered 2024-02-24: 6 mL via INTRAVENOUS

## 2024-02-28 ENCOUNTER — Ambulatory Visit: Payer: Self-pay | Admitting: Family Medicine

## 2024-02-28 DIAGNOSIS — S6981XS Other specified injuries of right wrist, hand and finger(s), sequela: Secondary | ICD-10-CM

## 2024-03-02 ENCOUNTER — Telehealth: Payer: Self-pay | Admitting: Family Medicine

## 2024-03-02 NOTE — Telephone Encounter (Unsigned)
 Copied from CRM [id number]. Topic: Clinical - Lab/Test Results >> Mar 02, 2024  3:13 PM Donee H wrote: Reason for CRM: Patient called requesting for recent MRI of Wrist results be sent has a paper copy via mail. She stated she forgot to mention it when she received results over phone. Please follow up with patient.

## 2024-03-02 NOTE — Telephone Encounter (Signed)
Results has been mailed  

## 2024-04-02 ENCOUNTER — Ambulatory Visit: Payer: Self-pay | Admitting: Orthopedic Surgery

## 2024-04-09 ENCOUNTER — Other Ambulatory Visit: Payer: Self-pay

## 2024-04-09 ENCOUNTER — Encounter: Payer: Self-pay | Admitting: Radiology

## 2024-04-29 NOTE — Progress Notes (Deleted)
 Left wrist follow up; MRI 02/28/24

## 2024-04-30 ENCOUNTER — Ambulatory Visit: Payer: Self-pay

## 2024-04-30 ENCOUNTER — Ambulatory Visit: Payer: Self-pay | Admitting: Orthopedic Surgery

## 2024-04-30 NOTE — Telephone Encounter (Signed)
 Copied from CRM #[id number]. Topic: Clinical - Red Word Triage >> Apr 30, 2024 11:59 AM Joesph NOVAK wrote: Red Word that prompted transfer to Nurse Triage: patient states she has pain in her hands, also can not hear very well. She was calling to r/s her January appt for a later time.  Refused triage, seeing orthopedic for this issue. Would like to change 07/06/23 to 05/29/24/ or 06/03/24. No availability. Please advise pt.Used Chartered Certified Accountant # [id number].

## 2024-05-02 NOTE — Telephone Encounter (Signed)
 Noted! Thank you

## 2024-05-29 ENCOUNTER — Ambulatory Visit: Payer: Self-pay | Attending: Family Medicine | Admitting: Family Medicine

## 2024-05-29 ENCOUNTER — Other Ambulatory Visit: Payer: Self-pay

## 2024-05-29 ENCOUNTER — Encounter: Payer: Self-pay | Admitting: Family Medicine

## 2024-05-29 ENCOUNTER — Encounter (INDEPENDENT_AMBULATORY_CARE_PROVIDER_SITE_OTHER): Payer: Self-pay

## 2024-05-29 VITALS — BP 105/63 | HR 76 | Temp 98.2°F | Ht 62.0 in | Wt 129.8 lb

## 2024-05-29 DIAGNOSIS — H6123 Impacted cerumen, bilateral: Secondary | ICD-10-CM

## 2024-05-29 DIAGNOSIS — M19032 Primary osteoarthritis, left wrist: Secondary | ICD-10-CM

## 2024-05-29 DIAGNOSIS — I10 Essential (primary) hypertension: Secondary | ICD-10-CM

## 2024-05-29 DIAGNOSIS — M19031 Primary osteoarthritis, right wrist: Secondary | ICD-10-CM

## 2024-05-29 DIAGNOSIS — E559 Vitamin D deficiency, unspecified: Secondary | ICD-10-CM

## 2024-05-29 DIAGNOSIS — Z23 Encounter for immunization: Secondary | ICD-10-CM

## 2024-05-29 DIAGNOSIS — G250 Essential tremor: Secondary | ICD-10-CM

## 2024-05-29 MED ORDER — DULOXETINE HCL 30 MG PO CPEP
30.0000 mg | ORAL_CAPSULE | Freq: Every day | ORAL | 1 refills | Status: AC
  Filled 2024-05-29: qty 30, 30d supply, fill #0

## 2024-05-29 MED ORDER — PREDNISONE 20 MG PO TABS
20.0000 mg | ORAL_TABLET | Freq: Every day | ORAL | 0 refills | Status: AC
  Filled 2024-05-29: qty 5, 5d supply, fill #0

## 2024-05-29 MED ORDER — LOSARTAN POTASSIUM 100 MG PO TABS
100.0000 mg | ORAL_TABLET | Freq: Every day | ORAL | 1 refills | Status: AC
  Filled 2024-05-29 – 2024-06-29 (×4): qty 90, 90d supply, fill #0

## 2024-05-29 NOTE — Progress Notes (Signed)
 "  Subjective:  Patient ID: Claudia Hernandez, female    DOB: 03-22-1951  Age: 73 y.o. MRN: [id number]  CC: Medical Management of Chronic Issues (Left and right Hand  swelling with pain. /)     Discussed the use of AI scribe software for clinical note transcription with the patient, who gave verbal consent to proceed.  History of Present Illness Claudia Hernandez is a 73 year old female with hypertension, tremors arthritis who presents with bilateral hand pain and swelling.  She has had bilateral hand pain and swelling for about eight months, worse on the right. An MRI of the right wrist was done, but she does not recall the results and missed an orthopedics appointment due to lack of transportation.  She takes acetaminophen  every 12 hours, which helps more than meloxicam , and she wants stronger pain control. Swelling has been persistent.  MRI wrist from 02/2024 revealed: IMPRESSION: 1. 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 2. Mild tendinosis of the extensor carpi ulnaris tendon near the distal ulna. 3. Mild tenosynovitis of the flexor carpi radialis tendon. 4. 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 5. Mild osteoarthritis of the scaphotrapeziotrapezoid joint and first CMC joint.    She has had hand tremors for over two years with a family history of similar symptoms in her dad. She was evaluated in [place] and was told medication could help, but she has not been treated here. Tremors worsen with anxiety or sadness.  She requests an ENT referral for ear cleaning due to narrow ear canals and ineffective prior cleanings.     Past Medical History:  Diagnosis Date   Arthritis    Asthma    hx of   Chronic diffuse otitis externa of both ears 08/04/2021   Heart murmur    unsure of this, but was told she had one by MD in [place]   Hypertension     Past Surgical History:  Procedure Laterality Date   BLADDER SURGERY      CESAREAN SECTION     CHOLECYSTECTOMY     gallbladder removed     left foot surgery      Family History  Problem Relation Age of Onset   Drug abuse Neg Hx    Hypertension Neg Hx    Colon cancer Neg Hx    Esophageal cancer Neg Hx    Rectal cancer Neg Hx    Stomach cancer Neg Hx    Breast cancer Neg Hx     Social History   Socioeconomic History   Marital status: Single    Spouse name: Not on file   Number of children: 5   Years of education: Not on file   Highest education level: Bachelor's degree (e.g., BA, AB, BS)  Occupational History   Not on file  Tobacco Use   Smoking status: Never   Smokeless tobacco: Never  Vaping Use   Vaping status: Never Used  Substance and Sexual Activity   Alcohol use: Not Currently   Drug use: Never   Sexual activity: Not on file  Other Topics Concern   Not on file  Social History Narrative   Not on file   Social Drivers of Health   Tobacco Use: Low Risk (01/03/2024)   Patient History    Smoking Tobacco Use: Never    Smokeless Tobacco Use: Never    Passive Exposure: Not on file  Financial Resource Strain: Not on file  Food Insecurity:  Not on file  Transportation Needs: Not on file  Physical Activity: Sufficiently Active (04/18/2023)   Exercise Vital Sign    Days of Exercise per Week: 3 days    Minutes of Exercise per Session: 60 min  Stress: Not on file  Social Connections: Not on file  Depression (PHQ2-9): Low Risk (01/03/2024)   Depression (PHQ2-9)    PHQ-2 Score: 0  Alcohol Screen: Not on file  Housing: Low Risk (04/18/2023)   Housing    Last Housing Risk Score: 0  Utilities: Not At Risk (04/18/2023)   AHC Utilities    Threatened with loss of utilities: No  Health Literacy: Inadequate Health Literacy (04/18/2023)   B1300 Health Literacy    Frequency of need for help with medical instructions: Sometimes    Allergies[1]  Outpatient Medications Prior to Visit  Medication Sig Dispense Refill   cyanocobalamin   (VITAMIN B12) 1000 MCG tablet Take 1 tablet (1,000 mcg total) by mouth daily. 130 tablet 1   losartan  (COZAAR ) 100 MG tablet Take 1 tablet (100 mg total) by mouth daily. 90 tablet 1   ACETAMINOPHEN  PO Take by mouth. PRN (Patient not taking: Reported on 05/29/2024)     COLLAGEN PO Take by mouth. (Patient not taking: Reported on 05/29/2024)     ergocalciferol  (DRISDOL ) 1.25 MG (50000 UT) capsule Take 1 capsule (50,000 Units total) by mouth once a week. (Patient not taking: Reported on 05/29/2024) 12 capsule 1   erythromycin  ophthalmic ointment Place a 1/2 inch ribbon of ointment into the lower eyelid. (Patient not taking: Reported on 05/29/2024) 3.5 g 0   albuterol  (PROVENTIL ) (2.5 MG/3ML) 0.083% nebulizer solution Take 3 mLs (2.5 mg total) by nebulization every 4 (four) hours as needed for wheezing or shortness of breath. (Patient not taking: Reported on 05/29/2024) 225 mL 0   Cholecalciferol  (VITAMIN D3) 50 MCG (2000 UT) capsule Take 1 capsule (2,000 Units total) by mouth daily. (Patient not taking: Reported on 05/29/2024) 100 capsule 1   ezetimibe  (ZETIA ) 10 MG tablet Take 1 tablet (10 mg total) by mouth daily. (Patient not taking: Reported on 05/29/2024) 90 tablet 3   fluticasone  (FLONASE ) 50 MCG/ACT nasal spray Place 1 spray into both nostrils daily. (Patient not taking: Reported on 05/29/2024) 9.9 mL 0   hydrocortisone  cream 0.5 % Apply 1 Application topically 2 (two) times daily. (Patient not taking: Reported on 05/29/2024) 30 g 0   loratadine  (CLARITIN ) 10 MG tablet Take 1 tablet (10 mg total) by mouth daily. (Patient not taking: Reported on 05/29/2024) 30 tablet 0   meloxicam  (MOBIC ) 7.5 MG tablet Take 1 tablet (7.5 mg total) by mouth daily. (Patient not taking: Reported on 05/29/2024) 90 tablet 1   olopatadine  (PATADAY ) 0.1 % ophthalmic solution Place 1 drop into both eyes 2 (two) times daily. (Patient not taking: Reported on 05/29/2024) 5 mL 3   Facility-Administered Medications Prior to  Visit  Medication Dose Route Frequency Provider Last Rate Last Admin   cyanocobalamin  (VITAMIN B12) injection 1,000 mcg  1,000 mcg Intramuscular Once          ROS Review of Systems  Constitutional:  Negative for activity change and appetite change.  HENT:  Negative for sinus pressure and sore throat.   Respiratory:  Negative for chest tightness, shortness of breath and wheezing.   Cardiovascular:  Negative for chest pain and palpitations.  Gastrointestinal:  Negative for abdominal distention, abdominal pain and constipation.  Genitourinary: Negative.   Musculoskeletal: Negative.   Neurological:  Positive for tremors.  Psychiatric/Behavioral:  Negative for behavioral problems and dysphoric mood.     Objective:  BP 105/63   Pulse 76   Temp 98.2 F (36.8 C) (Oral)   Ht 5' 2 (1.575 m)   Wt 129 lb 12.8 oz (58.9 kg)   SpO2 97%   BMI 23.74 kg/m      05/29/2024    9:37 AM 01/03/2024    4:23 PM 09/27/2023    7:33 PM  BP/Weight  Systolic BP 105 110 145  Diastolic BP 63 71 75  Wt. (Lbs) 129.8 125.6   BMI 23.74 kg/m2 22.97 kg/m2       Physical Exam Constitutional:      Appearance: She is well-developed.  HENT:     Right Ear: There is impacted cerumen.     Left Ear: There is impacted cerumen.  Cardiovascular:     Rate and Rhythm: Normal rate.     Heart sounds: Normal heart sounds. No murmur heard. Pulmonary:     Effort: Pulmonary effort is normal.     Breath sounds: Normal breath sounds. No wheezing or rales.  Chest:     Chest wall: No tenderness.  Abdominal:     General: Bowel sounds are normal. There is no distension.     Palpations: Abdomen is soft. There is no mass.     Tenderness: There is no abdominal tenderness.  Musculoskeletal:     Right lower leg: No edema.     Left lower leg: No edema.     Comments: Edema of both wrists right greater than left with tenderness on palpation of wrist joints.  Inversion and eversion reproducing tenderness.  Able to make a  fist bilaterally  Neurological:     Mental Status: She is alert and oriented to person, place, and time.     Coordination: Coordination abnormal (tremors of both outstretched hands).  Psychiatric:        Mood and Affect: Mood normal.        Latest Ref Rng & Units 01/24/2024    9:20 AM 04/11/2023    4:56 PM 02/18/2022   10:47 AM  CMP  Glucose 70 - 99 mg/dL 87  94    BUN 8 - 27 mg/dL 19  12    Creatinine 9.42 - 1.00 mg/dL 9.37  9.41    Sodium 865 - 144 mmol/L 140  143    Potassium 3.5 - 5.2 mmol/L 4.2  4.3    Chloride 96 - 106 mmol/L 104  104    CO2 20 - 29 mmol/L 21  24    Calcium  8.7 - 10.3 mg/dL 9.4  9.4    Total Protein 6.0 - 8.5 g/dL 6.7  7.0  7.2   Total Bilirubin 0.0 - 1.2 mg/dL 0.3  <9.7  0.4   Alkaline Phos 44 - 121 IU/L 94  103  85   AST 0 - 40 IU/L 32  23  24   ALT 0 - 32 IU/L 27  21  17      Lipid Panel     Component Value Date/Time   CHOL 175 01/24/2024 0920   TRIG 220 (H) 01/24/2024 0920   HDL 45 01/24/2024 0920   CHOLHDL 3.5 08/17/2023 1025   LDLCALC 93 01/24/2024 0920    CBC    Component Value Date/Time   WBC 10.9 (H) 08/17/2023 1025   WBC 5.9 01/21/2022 1725   RBC 4.59 08/17/2023 1025   RBC 3.87 01/21/2022 1725   HGB 14.0 08/17/2023 1025  HCT 43.1 08/17/2023 1025   PLT 273 08/17/2023 1025   MCV 94 08/17/2023 1025   MCH 30.5 08/17/2023 1025   MCH 36.2 (H) 01/21/2022 1725   MCHC 32.5 08/17/2023 1025   MCHC 34.5 01/21/2022 1725   RDW 12.0 08/17/2023 1025   LYMPHSABS 1.7 08/17/2023 1025   MONOABS 0.5 09/23/2020 1714   EOSABS 1.1 (H) 08/17/2023 1025   BASOSABS 0.1 08/17/2023 1025    Lab Results  Component Value Date   HGBA1C 5.3 01/24/2024    Lab Results  Component Value Date   TSH 3.340 06/07/2023       Assessment & Plan 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 Previous meloxicam  was ineffective.  Acetaminophen  provides better pain relief. Differential diagnosis includes gout. - Continue acetaminophen  every 12 hours for pain management. - Prescribed prednisone  for 5 days to reduce inflammation. - Prescribed duloxetine  for chronic pain management, with caution regarding potential side effects of nausea and vomiting. - Ordered blood tests to rule out gout. - Referred to orthopedics for further evaluation of the right wrist tendon tear but she no showed appointment and has been advised to give the office a call for another appointment.  Essential tremor Tremors present for over two years, likely familial. Thyroid  function normal, Parkinson's disease unlikely. - Referred to neurology for further evaluation and peace of mind.  Essential hypertension Blood pressure well-controlled with current medication regimen. - Continue losartan  for blood pressure management. -Counseled on blood pressure goal of less than 130/80, low-sodium, DASH diet, medication compliance, 150 minutes of moderate intensity exercise per week. Discussed medication compliance, adverse effects.   Vitamin D  deficiency Previous vitamin D  levels were low. She was not taking prescribed vitamin D . - Prescribed vitamin D  50,000 units once a week.  Bilateral impacted cerumen Difficulty with ear cleaning in the office due to narrow ear canals. - Referred to ENT for ear cleaning.  General Health Maintenance She is due for a flu shot. Encounter for vaccine administration- Administered flu shot.       Meds ordered this encounter  Medications   predniSONE  (DELTASONE ) 20 MG tablet    Sig: Take 1 tablet (20 mg total) by mouth daily with breakfast.    Dispense:  5 tablet    Refill:  0   DULoxetine  (CYMBALTA ) 30 MG capsule    Sig: Take 1 capsule (30 mg total) by mouth daily. For chronic wrist pain    Dispense:  30 capsule    Refill:  1   losartan  (COZAAR ) 100 MG tablet    Sig: Take 1 tablet (100 mg total) by mouth  daily.    Dispense:  90 tablet    Refill:  1    Follow-up: Return in about 6 months (around 11/27/2024) for Chronic medical conditions, cancel previous appointment.       Corrina Sabin, MD, FAAFP. [facility] [place], [place] [phone]   05/29/2024, 11:42 AM    [1]  Allergies Allergen Reactions   Statins Other (See Comments)    ELEVATED LFTs   "

## 2024-05-29 NOTE — Patient Instructions (Addendum)
 VISIT SUMMARY:  Today, you were seen for bilateral hand pain and swelling, hand tremors, and other health concerns. We discussed your current symptoms, reviewed your medications, and made some changes to help manage your conditions more effectively.  YOUR PLAN:  -PRIMARY OSTEOARTHRITIS OF BOTH WRISTS WITH RIGHT WRIST TENDON TEAR: Osteoarthritis is a condition where the cartilage in your joints wears down over time, causing pain and swelling. You also have a tendon tear in your right wrist. Continue taking acetaminophen  every 12 hours for pain. I have prescribed prednisone  for 5 days to reduce inflammation and duloxetine  to help manage chronic pain. We will also do blood tests to rule out gout and refer you to orthopedics for further evaluation of your right wrist.  -ESSENTIAL TREMOR: Essential tremor is a nervous system disorder that causes involuntary and rhythmic shaking, often in the hands. It is likely familial in your case. I have referred you to neurology for further evaluation.  -ESSENTIAL HYPERTENSION: Hypertension is high blood pressure. Your blood pressure is well-controlled with your current medication, losartan . Continue taking it as prescribed.  -VITAMIN D  DEFICIENCY: Vitamin D  deficiency means you have low levels of vitamin D , which is important for bone health. I have prescribed vitamin D  50,000 units to be taken once a week.  -BILATERAL IMPACTED CERUMEN: Impacted cerumen means you have a buildup of earwax in both ears, which can cause discomfort and hearing issues. I have referred you to an ENT specialist for ear cleaning.  -GENERAL HEALTH MAINTENANCE: You were due for a flu shot, which was administered today to help protect you from the flu.  INSTRUCTIONS:  Please follow up with orthopedics for your right wrist tendon tear evaluation and with neurology for your essential tremor. Also, schedule an appointment with an ENT specialist for ear cleaning. Continue taking your  medications as prescribed and complete the blood tests to rule out gout. If you experience any side effects from the new medications, please contact our office.

## 2024-05-30 ENCOUNTER — Ambulatory Visit: Payer: Self-pay | Admitting: Family Medicine

## 2024-05-30 LAB — URIC ACID: Uric Acid: 3.1 mg/dL (ref 3.1–7.9)

## 2024-06-29 ENCOUNTER — Other Ambulatory Visit (HOSPITAL_COMMUNITY): Payer: Self-pay

## 2024-06-29 ENCOUNTER — Other Ambulatory Visit: Payer: Self-pay

## 2024-07-05 ENCOUNTER — Ambulatory Visit: Payer: Self-pay | Admitting: Internal Medicine

## 2024-11-27 ENCOUNTER — Ambulatory Visit: Payer: Self-pay | Admitting: Family Medicine

## 2024-11-27 ENCOUNTER — Ambulatory Visit: Payer: Self-pay | Admitting: Internal Medicine
